# Patient Record
Sex: Male | Born: 1960 | Race: White | Hispanic: No | Marital: Married | Smoking: Never smoker
Health system: Southern US, Community
[De-identification: ages and names within clinical notes are randomized; demographics above are authoritative.]

## PROBLEM LIST (undated history)

## (undated) DIAGNOSIS — J449 Chronic obstructive pulmonary disease, unspecified: Secondary | ICD-10-CM

## (undated) DIAGNOSIS — H1010 Acute atopic conjunctivitis, unspecified eye: Secondary | ICD-10-CM

## (undated) DIAGNOSIS — J4489 Other specified chronic obstructive pulmonary disease: Secondary | ICD-10-CM

## (undated) DIAGNOSIS — G4700 Insomnia, unspecified: Secondary | ICD-10-CM

## (undated) DIAGNOSIS — K589 Irritable bowel syndrome without diarrhea: Secondary | ICD-10-CM

## (undated) DIAGNOSIS — Z8719 Personal history of other diseases of the digestive system: Secondary | ICD-10-CM

## (undated) DIAGNOSIS — E785 Hyperlipidemia, unspecified: Secondary | ICD-10-CM

## (undated) HISTORY — DX: Irritable bowel syndrome, unspecified: K58.9

## (undated) HISTORY — PX: COLONOSCOPY: SHX174

## (undated) HISTORY — DX: Hyperlipidemia, unspecified: E78.5

## (undated) HISTORY — DX: Insomnia, unspecified: G47.00

## (undated) HISTORY — DX: Other specified chronic obstructive pulmonary disease: J44.89

## (undated) HISTORY — DX: Acute atopic conjunctivitis, unspecified eye: H10.10

## (undated) HISTORY — DX: Chronic obstructive pulmonary disease, unspecified: J44.9

## (undated) HISTORY — DX: Personal history of other diseases of the digestive system: Z87.19

## (undated) HISTORY — PX: OTHER SURGICAL HISTORY: SHX169

---

## 1999-09-08 ENCOUNTER — Encounter: Admission: RE | Admit: 1999-09-08 | Discharge: 1999-09-08 | Payer: Self-pay

## 2001-04-19 ENCOUNTER — Encounter: Payer: Self-pay | Admitting: Family Medicine

## 2001-04-19 ENCOUNTER — Encounter: Admission: RE | Admit: 2001-04-19 | Discharge: 2001-04-19 | Payer: Self-pay | Admitting: Family Medicine

## 2003-07-07 DIAGNOSIS — Z860101 Personal history of adenomatous and serrated colon polyps: Secondary | ICD-10-CM

## 2003-07-07 DIAGNOSIS — Z86010 Personal history of colonic polyps: Secondary | ICD-10-CM

## 2003-07-07 HISTORY — DX: Personal history of colonic polyps: Z86.010

## 2003-07-07 HISTORY — DX: Personal history of adenomatous and serrated colon polyps: Z86.0101

## 2004-07-09 ENCOUNTER — Ambulatory Visit: Payer: Self-pay | Admitting: Internal Medicine

## 2005-05-15 ENCOUNTER — Ambulatory Visit: Payer: Self-pay | Admitting: Internal Medicine

## 2006-03-11 ENCOUNTER — Ambulatory Visit: Payer: Self-pay | Admitting: Internal Medicine

## 2007-05-30 ENCOUNTER — Telehealth: Payer: Self-pay | Admitting: Internal Medicine

## 2007-06-06 ENCOUNTER — Ambulatory Visit: Payer: Self-pay | Admitting: Internal Medicine

## 2007-06-12 ENCOUNTER — Encounter: Payer: Self-pay | Admitting: Internal Medicine

## 2007-06-12 DIAGNOSIS — G4700 Insomnia, unspecified: Secondary | ICD-10-CM | POA: Insufficient documentation

## 2007-06-12 DIAGNOSIS — J309 Allergic rhinitis, unspecified: Secondary | ICD-10-CM

## 2007-06-12 DIAGNOSIS — J4489 Other specified chronic obstructive pulmonary disease: Secondary | ICD-10-CM | POA: Insufficient documentation

## 2007-06-12 DIAGNOSIS — H1010 Acute atopic conjunctivitis, unspecified eye: Secondary | ICD-10-CM | POA: Insufficient documentation

## 2007-06-12 DIAGNOSIS — J449 Chronic obstructive pulmonary disease, unspecified: Secondary | ICD-10-CM | POA: Insufficient documentation

## 2007-06-12 DIAGNOSIS — Z8719 Personal history of other diseases of the digestive system: Secondary | ICD-10-CM | POA: Insufficient documentation

## 2007-06-13 ENCOUNTER — Telehealth (INDEPENDENT_AMBULATORY_CARE_PROVIDER_SITE_OTHER): Payer: Self-pay | Admitting: *Deleted

## 2007-06-27 ENCOUNTER — Telehealth (INDEPENDENT_AMBULATORY_CARE_PROVIDER_SITE_OTHER): Payer: Self-pay | Admitting: *Deleted

## 2008-06-12 ENCOUNTER — Ambulatory Visit: Payer: Self-pay | Admitting: Internal Medicine

## 2008-08-13 ENCOUNTER — Telehealth (INDEPENDENT_AMBULATORY_CARE_PROVIDER_SITE_OTHER): Payer: Self-pay | Admitting: *Deleted

## 2009-02-12 ENCOUNTER — Encounter (INDEPENDENT_AMBULATORY_CARE_PROVIDER_SITE_OTHER): Payer: Self-pay | Admitting: *Deleted

## 2009-07-17 ENCOUNTER — Telehealth: Payer: Self-pay | Admitting: Internal Medicine

## 2009-08-07 ENCOUNTER — Ambulatory Visit: Payer: Self-pay | Admitting: Internal Medicine

## 2009-11-18 ENCOUNTER — Encounter (INDEPENDENT_AMBULATORY_CARE_PROVIDER_SITE_OTHER): Payer: Self-pay | Admitting: *Deleted

## 2010-08-05 NOTE — Progress Notes (Signed)
Summary: RX SCRIPTS  Phone Note Call from Patient   Caller: Patient Call For: YOUNG Summary of Call: PT WANTS SCRIPTS SENT TO HIS ADDRESS FOR THE FOLLOWING PRESCRIPTIONS: ALBUTEROL / ADVAIR / AND SINGULAIR. HE WANTS A 3 MONTH SUPPLY. PT# [id number].  [facility] [phone] PATIENT'S CHART HAS BEEN REQUESTED Initial call taken by: Tivis Ringer,  May 30, 2007 11:52 AM  Follow-up for Phone Call        Pt last seen 9/07. Pt overdue for f/u with CY.  Pt made attp for 06/06/07.  Rx written and forwarded to CY to sign. Triage call. Follow-up by: Cloyde Reams,  May 30, 2007 3:08 PM  Additional Follow-up for Phone Call Additional follow up Details #1::        Requested rxs signed and mailed Additional Follow-up by: Waymon Budge MD,  May 30, 2007 4:48 PM

## 2010-08-05 NOTE — Letter (Signed)
Summary: Recall Colonoscopy Letter  [facility]  [address] [place], [place] [address]   Phone: [phone]  Fax: [phone]      February 12, 2009 MRN: [id number]   Justin Pennington [address] [place], [place]  [address]   Dear Mr. Deboard,   According to your medical record, it is time for you to schedule a Colonoscopy. The American Cancer Society recommends this procedure as a method to detect early colon cancer. Patients with a family history of colon cancer, or a personal history of colon polyps or inflammatory bowel disease are at increased risk.  This letter has beeen generated based on the recommendations made at the time of your procedure. If you feel that in your particular situation this may no longer apply, please contact our office.  Please call our office at [phone] to schedule this appointment or to update your records at your earliest convenience.  Thank you for cooperating with [place] to provide you with the very best care possible.   Sincerely,  Iva Boop, M.D.  [facility] [phone]

## 2010-08-05 NOTE — Progress Notes (Signed)
Summary: prescript  Phone Note Call from Patient Call back at [phone]   Caller: Patient Call For: young Summary of Call: need prescript [facility] for advair singulair and albuterol inhaler Initial call taken by: Rickard Patience,  August 13, 2008 1:30 PM  Follow-up for Phone Call        called and spoke with pt. pt aware rx sent to pharmacy.  Megan Reynolds LPN  August 13, 2008 2:51 PM     New/Updated Medications: SINGULAIR 10 MG  TABS (MONTELUKAST SODIUM) Take 1 tablet by mouth once a day ADVAIR DISKUS 100-50 MCG/DOSE  MISC (FLUTICASONE-SALMETEROL) Inhale 1 puff two times a day.  Rinse mouth out after use   Prescriptions: PROAIR HFA 108 (90 BASE) MCG/ACT AERS (ALBUTEROL SULFATE) 2 puffs four times a day as needed  #3 x 3   Entered by:   Arman Filter LPN   Authorized by:   Waymon Budge MD   Signed by:   Arman Filter LPN on 98/05/9146   Method used:   Electronically to        [facility]mail[facility])       [address] [place], [place]  [address]       Ph: [phone]       Fax: [phone]   RxID:   [id number] ADVAIR DISKUS 100-50 MCG/DOSE  MISC (FLUTICASONE-SALMETEROL) Inhale 1 puff two times a day.  Rinse mouth out after use  #3 x 3   Entered by:   Arman Filter LPN   Authorized by:   Waymon Budge MD   Signed by:   Arman Filter LPN on 03/47/4259   Method used:   Electronically to        [facility] [employer]       [address] [place], [place]  [address]       Ph: [phone]       Fax: [phone]   RxID:   [id number] SINGULAIR 10 MG  TABS (MONTELUKAST SODIUM) Take 1 tablet by mouth once a day  #90 x 3   Entered by:   Arman Filter LPN   Authorized by:   Waymon Budge MD   Signed by:   Arman Filter LPN on 54/27/0623   Method used:   Electronically to        [facility] [employer]       [address] [place], [place]  [address]       Ph: [phone]       Fax: [phone]   RxID:   [id number]

## 2010-08-05 NOTE — Progress Notes (Signed)
Summary: rx that needs to be faxed  Phone Note Call from Patient Call back at Home Phone [phone]   Caller: Patient Call For: young Reason for Call: Talk to Nurse, Talk to Doctor Summary of Call: [facility].  They will not accept rx faxed from pt.  Can we please fax rx for albuterol inhaler, advair 100/50, and singulair 10mg  to [facility] fax# [phone].  call pt and let him know when it's taken care of. chart ordered  Initial call taken by: Eugene Gavia,  June 13, 2007 9:25 AM  Follow-up for Phone Call        dr young please sign rx so we can fax thank you. triage call. Follow-up by: Vernie Murders,  June 13, 2007 3:42 PM  Additional Follow-up for Phone Call Additional follow up Details #1::        Done, thanks. Three month rxs. Additional Follow-up by: Waymon Budge MD,  June 13, 2007 8:29 PM    Additional Follow-up for Phone Call Additional follow up Details #2::    Rx faxed. Pt aware. Follow-up by: Cloyde Reams RN,  June 14, 2007 9:36 AM

## 2010-08-05 NOTE — Progress Notes (Signed)
Summary: MAIL ORDER RX  Phone Note Call from Patient Call back at Home Phone [phone]   Caller: Patient Reason for Call: Refill Medication Summary of Call: PT CALLED SAYING HIS MAIL ORDER RX HAS NOT BEEN FAXED OR MAILED PT HAS BEEN WAITING FOR 3 WEEKS/PLEASE F/U  PATIENT'S CHART HAS BEEN REQUESTED Initial call taken by: Lehman Prom,  June 27, 2007 10:09 AM  Follow-up for Phone Call        [facility].  Called in advair 100/50 #3 with 3ref, albuterol HFA #3 with 3 ref, singulair 10mg  #90 with 3 ref. Called pt notified rx called to pharmacy. Follow-up by: Cloyde Reams RN,  June 27, 2007 10:33 AM

## 2010-08-05 NOTE — Assessment & Plan Note (Signed)
Summary: rov/apc   PCP:  Vernon Prey  Chief Complaint:  follow up visit .  History of Present Illness: Current Problems:  CHRONIC OBSTRUCTIVE ASTHMA UNSPECIFIED (ICD-493.20) ALLERGIC CONJUNCTIVITIS (ICD-372.14) INSOMNIA UNSPECIFIED (ICD-780.52) IRRITABLE BOWEL SYNDROME, HX OF (ICD-V12.79)    12/08- HISTORY:  He says that he has done very well on one year follow up. No specific respiratory problems at all. Recent physical examination with consideration of irritable bowel syndrome. He does notice some nasal congestion occasionally, but feels it is adequately managed.   06/12/08- Asthma/ COPD, allergic conjunctivitis Very good year with no acute problems. Had seasonal flu vax. Discussed H1N1. Med talk.          Prior Medications Reviewed Using: Patient Recall  Updated Prior Medication List: SINGULAIR 10 MG  TABS (MONTELUKAST SODIUM) 1 daily ADVAIR DISKUS 100-50 MCG/DOSE  MISC (FLUTICASONE-SALMETEROL) 1 puff twice daily, rinse NASONEX 50 MCG/ACT  SUSP (MOMETASONE FUROATE) 1 spray each nostril daily ALBUTEROL 90 MCG/ACT  AERS (ALBUTEROL) 2 puffs, 4 times dailyas needed  Current Allergies (reviewed today): No known allergies   Past Medical History:    Reviewed history and no changes required:              CHRONIC OBSTRUCTIVE ASTHMA UNSPECIFIED (ICD-493.20)       ALLERGIC CONJUNCTIVITIS (ICD-372.14)       INSOMNIA UNSPECIFIED (ICD-780.52)       IRRITABLE BOWEL SYNDROME, HX OF (ICD-V12.79)         Past Surgical History:    Reviewed history and no changes required:       gingival graft       left meniscus tear- arthroscopic repair   Family History:    brother- mild adult onset asthma  Social History:    Reviewed history and no changes required:       Patient never smoked.        works for [employer]   Risk Factors:  Tobacco use:  never   Review of Systems       Denies headache, sinus drainage, sneezing chest pain, dyspnea, n/v/d, weight  loss, fever, edema.     Vital Signs:  Patient Profile:   50 Years Old Male Weight:      178.50 pounds O2 Sat:      97 % O2 treatment:    Room Air Pulse rate:   65 / minute BP sitting:   122 / 80  (left arm) Cuff size:   regular  Vitals Entered By: Reynaldo Minium CMA (June 12, 2008 10:12 AM)             Comments Medications reviewed with patient Reynaldo Minium CMA  June 12, 2008 10:13 AM      Physical Exam  General: A/Ox3; pleasant and cooperative, NAD, SKIN: no rash, lesions NODES: no lymphadenopathy HEENT: [place]/AT, EOM- WNL, Conjuctivae- clear, PERRLA, TM-WNL, Nose- clear, Throat- clear and wnl NECK: Supple w/ fair ROM, JVD- none, normal carotid impulses w/o bruits Thyroid- normal to palpation CHEST: Trace wheeze Right axilla, slow expiratory phase, no cough or rhonchi, unlabored. HEART: RRR, no m/g/r heard ABDOMEN: Soft and nl; nml bowel sounds; no organomegaly or masses noted ZOX:WRUE, nl pulses, no edema  NEURO: Grossly intact to observation         Problem # 1:  CHRONIC OBSTRUCTIVE ASTHMA UNSPECIFIED (ICD-493.20) We think this is chronic fixed asthma. He has done quite well inlast couple of years, having learned to manage exposures and work with meds. Plan H1N1  vax. His updated medication list for this problem includes:    Singulair 10 Mg Tabs (Montelukast sodium) .Marland Kitchen... 1 daily    Advair Diskus 100-50 Mcg/dose Misc (Fluticasone-salmeterol) .Marland Kitchen... 1 puff twice daily, rinse    Proair Hfa 108 (90 Base) Mcg/act Aers (Albuterol sulfate) .Marland Kitchen... 2 puffs four times a day as needed   Problem # 2:  ALLERGIC CONJUNCTIVITIS (ICD-372.14) Assessment: Improved  Medications Added to Medication List This Visit: 1)  Proair Hfa 108 (90 Base) Mcg/act Aers (Albuterol sulfate) .... 2 puffs four times a day as needed   Flu Vaccine Consent Questions     Do you have a history of severe allergic reactions to this vaccine? no    Any prior history of allergic reactions to egg  and/or gelatin? no    Do you have a sensitivity to the preservative Thimersol? no    Do you have a past history of Guillan-Barre Syndrome? no    Do you currently have an acute febrile illness? no    Have you ever had a severe reaction to latex? no    Vaccine information given and explained to patient? yes    Are you currently pregnant? no   Do you have Asthma? no   Lot Number: [id number]   Exp Date:01/02/2009   Site Given  Deltoid Right Marijo File CMA  June 12, 2008 10:52 AM      Patient Instructions: 1)  Please schedule a follow-up appointment in 1 year. 2)  Call as needed and to refill meds. 3)  H1N1 flu vaccine.   Prescriptions: PROAIR HFA 108 (90 BASE) MCG/ACT AERS (ALBUTEROL SULFATE) 2 puffs four times a day as needed  #3 x 3   Entered and Authorized by:   Waymon Budge MD   Signed by:   Waymon Budge MD on 06/12/2008   Method used:   Historical   RxID:   [id number]  ]

## 2010-08-05 NOTE — Progress Notes (Signed)
Summary: Schedule Colonoscopy  Phone Note Outgoing Call Call back at Home Phone [phone]   Call placed by: Harlow Mares CMA Duncan Dull),  July 17, 2009 2:04 PM Call placed to: Patient Summary of Call: patient has had a recent death in his family and would like me to call him back in a month. I advised him I will call him in about a month to set up his colonoscopy. Initial call taken by: Harlow Mares CMA Duncan Dull),  July 17, 2009 2:04 PM     Appended Document: Schedule Colonoscopy called patient again to try to schedule and he now wants to schedule in he fall we will mail him a reminder letter. now

## 2010-08-05 NOTE — Assessment & Plan Note (Signed)
Summary: 12 months/apc   Primary Provider/Referring Provider:  Vernon Prey  CC:  Yearly follow up visit-asthma and allergies; occasional sinus drainage but not bothersome.Marland Kitchen  History of Present Illness:  12/08- HISTORY:  He says that he has done very well on one year follow up. No specific respiratory problems at all. Recent physical examination with consideration of irritable bowel syndrome. He does notice some nasal congestion occasionally, but feels it is adequately managed.   06/12/08- Asthma/ COPD, allergic conjunctivitis Very good year with no acute problems. Had seasonal flu vax. Discussed H1N1. Med talk.  August 07, 2009- Asthma/COPD, allergic rhinitis Continues to use Advair and Proair just once daily. Rarely needs nasonex. Eyes bother him most- treated otc drops  Consider updating PFT and CXR next year  Current Medications (verified): 1)  Singulair 10 Mg  Tabs (Montelukast Sodium) .... Take 1 Tablet By Mouth Once A Day 2)  Advair Diskus 100-50 Mcg/dose  Misc (Fluticasone-Salmeterol) .... Inhale 1 Puff Two Times A Day.  Rinse Mouth Out After Use 3)  Nasonex 50 Mcg/act  Susp (Mometasone Furoate) .Marland Kitchen.. 1 Spray Each Nostril Daily 4)  Proair Hfa 108 (90 Base) Mcg/act Aers (Albuterol Sulfate) .... 2 Puffs Four Times A Day As Needed  Allergies (verified): No Known Drug Allergies  Past History:  Past Medical History: Last updated: 06/12/2008  CHRONIC OBSTRUCTIVE ASTHMA UNSPECIFIED (ICD-493.20) ALLERGIC CONJUNCTIVITIS (ICD-372.14) INSOMNIA UNSPECIFIED (ICD-780.52) IRRITABLE BOWEL SYNDROME, HX OF (ICD-V12.79)  Past Surgical History: Last updated: 06/12/2008 gingival graft left meniscus tear- arthroscopic repair  Family History: Last updated: 08/07/2009 brother- mild adult onset asthma Father- died postop complications fo bladder cancer  Social History: Last updated: 06/12/2008 Patient never smoked.  works for [employer]  Risk Factors: Smoking  Status: never (06/12/2008)  Family History: brother- mild adult onset asthma Father- died postop complications fo bladder cancer  Review of Systems      See HPI  The patient denies anorexia, fever, weight loss, weight gain, vision loss, decreased hearing, hoarseness, chest pain, syncope, dyspnea on exertion, peripheral edema, prolonged cough, headaches, hemoptysis, and severe indigestion/heartburn.    Vital Signs:  Patient profile:   50 year old male Height:      70 inches Weight:      177.38 pounds BMI:     25.54 O2 Sat:      93 % on Room air Pulse rate:   71 / minute BP sitting:   118 / 62  (left arm) Cuff size:   regular  Vitals Entered By: Reynaldo Minium CMA (August 07, 2009 9:58 AM)  O2 Flow:  Room air  Physical Exam  Additional Exam:  General: A/Ox3; pleasant and cooperative, NAD, SKIN: no rash, lesions NODES: no lymphadenopathy HEENT: [place]/AT, EOM- WNL, Conjuctivae- clear, PERRLA, TM-WNL, Nose- clear, Throat- clear and wnl NECK: Supple w/ fair ROM, JVD- none, normal carotid impulses w/o bruits Thyroid- CHEST: Trace wheeze Right axilla, slow expiratory phase, no cough or rhonchi, unlabored. HEART: RRR, no m/g/r heard ABDOMEN: Soft and nl; ZOX:WRUE, nl pulses, no edema  NEURO: Grossly intact to observation      Impression & Recommendations:  Problem # 1:  CHRONIC OBSTRUCTIVE ASTHMA UNSPECIFIED (ICD-493.20) Doing very well without changes needed. Had flu vax. has hasd at least one, probably 2 Pneumovax.  consider updating PFT and CXR next year.  Problem # 2:  ALLERGIC CONJUNCTIVITIS (ICD-372.14)  Eyes look fine on exanm now. he is aware to see his opthalm and also that tere are prescription eye  meds if needed.  Other Orders: Est. Patient Level III ([address])  Patient Instructions: 1)  Schedule return in one year, earlier if needed 2)  Call for scripts as needed. Prescriptions: PROAIR HFA 108 (90 BASE) MCG/ACT AERS (ALBUTEROL SULFATE) 2 puffs four times a  day as needed  #3 x 3   Entered and Authorized by:   Waymon Budge MD   Signed by:   Waymon Budge MD on 08/07/2009   Method used:   Print then Give to Patient   RxID:   [id number] ADVAIR DISKUS 100-50 MCG/DOSE  MISC (FLUTICASONE-SALMETEROL) Inhale 1 puff two times a day.  Rinse mouth out after use  #3 x 3   Entered and Authorized by:   Waymon Budge MD   Signed by:   Waymon Budge MD on 08/07/2009   Method used:   Print then Give to Patient   RxID:   [id number] SINGULAIR 10 MG  TABS (MONTELUKAST SODIUM) Take 1 tablet by mouth once a day  #90 x 3   Entered and Authorized by:   Waymon Budge MD   Signed by:   Waymon Budge MD on 08/07/2009   Method used:   Print then Give to Patient   RxID:   [id number]      Appended Document: 12 months/apc    Clinical Lists Changes  Observations: Added new observation of PNEUMOVAX: Historical (06/06/2007 11:33)       Immunization History:  Pneumovax Immunization History:    Pneumovax:  historical (06/06/2007)

## 2010-08-05 NOTE — Letter (Signed)
Summary: Colonoscopy Letter  [facility]  [address] [place], [place] [address]   Phone: [phone]  Fax: [phone]      Nov 18, 2009 MRN: [id number]   Justin Pennington [address] [place], [place]  [address]   Dear [name],   According to your medical record, it is time for you to schedule a Colonoscopy. The American Cancer Society recommends this procedure as a method to detect early colon cancer. Patients with a family history of colon cancer, or a personal history of colon polyps or inflammatory bowel disease are at increased risk.  This letter has beeen generated based on the recommendations made at the time of your procedure. If you feel that in your particular situation this may no longer apply, please contact our office.  Please call our office at [phone] to schedule this appointment or to update your records at your earliest convenience.  Thank you for cooperating with [place] to provide you with the very best care possible.   Sincerely,  Iva Boop, M.D.  [facility] [phone]

## 2010-08-07 ENCOUNTER — Ambulatory Visit: Admit: 2010-08-07 | Payer: Self-pay | Admitting: Internal Medicine

## 2010-08-07 ENCOUNTER — Ambulatory Visit: Payer: Self-pay | Admitting: Internal Medicine

## 2010-08-21 ENCOUNTER — Telehealth: Payer: Self-pay | Admitting: Internal Medicine

## 2010-08-27 NOTE — Progress Notes (Signed)
Summary: refill advair asap  Phone Note Call from Patient Call back at Home Phone [phone]   Caller: Patient Call For: Justin Pennington Summary of Call: pt wants rx called in asap for advair. [facility] [phone]. pt has appt next month w/ dr Windsor Goeken Initial call taken by: Tivis Ringer, CNA,  August 21, 2010 1:19 PM  Follow-up for Phone Call        Advair rx was sent to [facility] on 08/19/10 #3 x 0.  Pt was last seen by CY 08/2009 and has pending OV on 09/05/2010.   Crystal Jones RN  August 21, 2010 2:35 PM   called and spoke with pt and he stated that he spoke with [facility] and they have not received any refill request for the advair---resent rx for pt and he is aware of this.   Randell Loop CMA  August 21, 2010 4:39 PM      Prescriptions: ADVAIR DISKUS 100-50 MCG/DOSE  MISC (FLUTICASONE-SALMETEROL) Inhale 1 puff two times a day.  Rinse mouth out after use  #3 x 0   Entered by:   Randell Loop CMA   Authorized by:   Waymon Budge MD   Signed by:   Randell Loop CMA on 08/21/2010   Method used:   Faxed to ...       [facility]       [address]       [place], [place]  [address]       Ph: [phone]       Fax: [phone]   RxID:   [id number]

## 2010-09-05 ENCOUNTER — Ambulatory Visit (INDEPENDENT_AMBULATORY_CARE_PROVIDER_SITE_OTHER)
Admission: RE | Admit: 2010-09-05 | Discharge: 2010-09-05 | Disposition: A | Discharge status: Home / Self Care | Payer: PRIVATE HEALTH INSURANCE | Source: Ambulatory Visit | Attending: Internal Medicine | Admitting: Internal Medicine

## 2010-09-05 ENCOUNTER — Other Ambulatory Visit: Payer: Self-pay | Admitting: Internal Medicine

## 2010-09-05 ENCOUNTER — Ambulatory Visit (INDEPENDENT_AMBULATORY_CARE_PROVIDER_SITE_OTHER): Payer: PRIVATE HEALTH INSURANCE | Admitting: Internal Medicine

## 2010-09-05 ENCOUNTER — Encounter: Payer: Self-pay | Admitting: Internal Medicine

## 2010-09-05 DIAGNOSIS — J4489 Other specified chronic obstructive pulmonary disease: Secondary | ICD-10-CM

## 2010-09-05 DIAGNOSIS — J302 Other seasonal allergic rhinitis: Secondary | ICD-10-CM | POA: Insufficient documentation

## 2010-09-05 DIAGNOSIS — J449 Chronic obstructive pulmonary disease, unspecified: Secondary | ICD-10-CM

## 2010-09-05 DIAGNOSIS — J3089 Other allergic rhinitis: Secondary | ICD-10-CM

## 2010-09-05 DIAGNOSIS — H1045 Other chronic allergic conjunctivitis: Secondary | ICD-10-CM

## 2010-09-05 DIAGNOSIS — J309 Allergic rhinitis, unspecified: Secondary | ICD-10-CM

## 2010-09-16 NOTE — Assessment & Plan Note (Signed)
Summary: Justin Pennington   Primary Provider/Referring Provider:  Vernon Prey  CC:  Follow up visit-asthma and allergies..  History of Present Illness: 12/08- HISTORY:  He says that he has done very well on one year follow up. No specific respiratory problems at all. Recent physical examination with consideration of irritable bowel syndrome. He does notice some nasal congestion occasionally, but feels it is adequately managed.  06/12/08- Asthma/ COPD, allergic conjunctivitis Very good year with no acute problems. Had seasonal flu vax. Discussed H1N1. Med talk.  August 07, 2009- Asthma/COPD, allergic rhinitis Continues to use Advair and Proair just once daily. Rarely needs nasonex. Eyes bother him most- treated otc drops  Consider updating PFT and CXR next year  September 05, 2010- Asthma/ COPD, allergic conjunctivitis Nurse-CC: Follow up visit-asthma and allergies. Notes some random eye watering- discussed possible triggers.  Exercises three times weekly and gets expected dyspnea with that. Denies cough, wheeze, chest pain.  meds reviewed as listed. Youngest child has allergies, 51 yo brother has adult onset "allergic asthma".    Asthma History    Initial Asthma Severity Rating:    Age range: 12+ years    Symptoms: 0-2 days/week    Nighttime Awakenings: 0-2/month    Interferes w/ normal activity: no limitations    SABA use (not for EIB): 0-2 days/week    Asthma Severity Assessment: Intermittent   Preventive Screening-Counseling & Management  Alcohol-Tobacco     Smoking Status: never  Current Medications (verified): 1)  Singulair 10 Mg  Tabs (Montelukast Sodium) .... Take 1 Tablet By Mouth Once A Day 2)  Advair Diskus 100-50 Mcg/dose  Misc (Fluticasone-Salmeterol) .... Inhale 1 Puff Two Times A Day.  Rinse Mouth Out After Use 3)  Nasonex 50 Mcg/act  Susp (Mometasone Furoate) .Marland Kitchen.. 1 Spray Each Nostril Daily As Needed 4)  Proair Hfa 108 (90 Base) Mcg/act Aers (Albuterol Sulfate) .... 2  Puffs Four Times A Day As Needed  Allergies (verified): No Known Drug Allergies  Past History:  Past Medical History: Last updated: 06/12/2008  CHRONIC OBSTRUCTIVE ASTHMA UNSPECIFIED (ICD-493.20) ALLERGIC CONJUNCTIVITIS (ICD-372.14) INSOMNIA UNSPECIFIED (ICD-780.52) IRRITABLE BOWEL SYNDROME, HX OF (ICD-V12.79)  Past Surgical History: Last updated: 06/12/2008 gingival graft left meniscus tear- arthroscopic repair  Family History: Last updated: 08/07/2009 brother- mild adult onset asthma Father- died postop complications fo bladder cancer  Social History: Last updated: 06/12/2008 Patient never smoked.  works for [employer]  Risk Factors: Smoking Status: never (09/05/2010)  Review of Systems      See HPI       The patient complains of shortness of breath with activity.  The patient denies shortness of breath at rest, productive cough, non-productive cough, coughing up blood, chest pain, irregular heartbeats, acid heartburn, indigestion, loss of appetite, weight change, abdominal pain, difficulty swallowing, sore throat, tooth/dental problems, headaches, nasal congestion/difficulty breathing through nose, sneezing, itching, ear ache, and anxiety.    Vital Signs:  Patient profile:   50 year old male Height:      70 inches Weight:      178.25 pounds BMI:     25.67 O2 Sat:      95 % on Room air Pulse rate:   76 / minute BP sitting:   124 / 80  (left arm) Cuff size:   regular  Vitals Entered By: Reynaldo Minium CMA (September 05, 2010 10:10 AM)  O2 Flow:  Room air CC: Follow up visit-asthma and allergies.   Physical Exam  Additional Exam:  General: A/Ox3; pleasant and cooperative, NAD, SKIN: no rash, lesions NODES: no lymphadenopathy HEENT: [place]/AT, EOM- WNL, Conjuctivae- clear, PERRLA, TM-WNL, Nose- stuffy with turbinate edema and sniffing, Throat- clear and wnl, Mallampati  III NECK: Supple w/ fair ROM, JVD- none, normal carotid impulses w/o bruits  Thyroid- CHEST: Trace wheeze Right axilla, slow expiratory phase, no cough or rhonchi, unlabored. HEART: RRR, no m/g/r heard ABDOMEN: Soft and nl; RKY:HCWC, nl pulses, no edema  NEURO: Grossly intact to observation      Impression & Recommendations:  Problem # 1:  CHRONIC OBSTRUCTIVE ASTHMA UNSPECIFIED (ICD-493.20) Well controlled, but baseline needs to be re-established. We will update CXR and PFT  Problem # 2:  ALLERGIC CONJUNCTIVITIS (ICD-372.14)  Fair control. Will suggest otc meds.   Problem # 3:  ALLERGIC RHINITIS (ICD-477.9)  Already on Nasonex and singulair. We discussed antihistamines and decongetants.  His updated medication list for this problem includes:    Nasonex 50 Mcg/act Susp (Mometasone furoate) .Marland Kitchen... 1 spray each nostril daily as needed  Orders: Est. Patient Level III (37628)  Medications Added to Medication List This Visit: 1)  Nasonex 50 Mcg/act Susp (Mometasone furoate) .Marland Kitchen.. 1 spray each nostril daily as needed  Other Orders: T-2 View CXR ([id number])  Patient Instructions: 1)  Please schedule a follow-up appointment in 1 year. Please call sooner as needed. 2)  Refill scripts 3)  Antihistamines for sneeze, drainage- 4)  loratadine/ Claritin 5)  cetirizine/ Zyrtec 6)  fexofenadine/ Allegra (180) 7)  otc decongestant- phenylephrine    "-PE", like Sudafed-PE and others 8)  A chest x-ray has been recommended.  Your imaging study may require preauthorization.  9)  Schedule PFT 10)  Test for a1AT Prescriptions: PROAIR HFA 108 (90 BASE) MCG/ACT AERS (ALBUTEROL SULFATE) 2 puffs four times a day as needed  #3 x 3   Entered and Authorized by:   Waymon Budge MD   Signed by:   Waymon Budge MD on 09/05/2010   Method used:   Print then Give to Patient   RxID:   [id number] NASONEX 50 MCG/ACT  SUSP (MOMETASONE FUROATE) 1 spray each nostril daily as needed  #3 x 3   Entered and Authorized by:   Waymon Budge MD   Signed by:   Waymon Budge MD on  09/05/2010   Method used:   Print then Give to Patient   RxID:   [id number] ADVAIR DISKUS 100-50 MCG/DOSE  MISC (FLUTICASONE-SALMETEROL) Inhale 1 puff two times a day.  Rinse mouth out after use  #3 x 3   Entered and Authorized by:   Waymon Budge MD   Signed by:   Waymon Budge MD on 09/05/2010   Method used:   Print then Give to Patient   RxID:   [id number] SINGULAIR 10 MG  TABS (MONTELUKAST SODIUM) Take 1 tablet by mouth once a day  #90 x 3   Entered and Authorized by:   Waymon Budge MD   Signed by:   Waymon Budge MD on 09/05/2010   Method used:   Print then Give to Patient   RxID:   [id number]

## 2010-09-18 ENCOUNTER — Encounter: Payer: Self-pay | Admitting: Internal Medicine

## 2010-09-18 LAB — CONVERTED CEMR LAB

## 2010-09-23 NOTE — Miscellaneous (Signed)
Summary: ALPHA 1 ANTITRYPSIN RESULTS/KCW  Clinical Lists Changes  Observations: Added new observation of A-1 ANTITRYP: MM (09/18/2010 11:11)

## 2010-09-30 ENCOUNTER — Encounter: Payer: Self-pay | Admitting: Internal Medicine

## 2010-10-13 NOTE — Progress Notes (Signed)
Quick Note:  Pt aware of results. ______ 

## 2010-10-13 NOTE — Progress Notes (Signed)
Quick Note:  LMTCB ______ 

## 2010-11-17 ENCOUNTER — Telehealth: Payer: Self-pay | Admitting: Internal Medicine

## 2010-11-17 MED ORDER — MONTELUKAST SODIUM 10 MG PO TABS: 10.0000 mg | ORAL_TABLET | Freq: Every day | ORAL | Status: DC

## 2010-11-17 MED ORDER — ALBUTEROL SULFATE HFA 108 (90 BASE) MCG/ACT IN AERS: 2.0000 | INHALATION_SPRAY | Freq: Four times a day (QID) | RESPIRATORY_TRACT | Status: DC | PRN

## 2010-11-17 NOTE — Telephone Encounter (Signed)
Spoke with pt to verify msg. He is requesting 30 day supply on singulair and proair. Rxs were sent to pharm.

## 2010-11-17 NOTE — Telephone Encounter (Signed)
LMOMTCB

## 2010-11-18 NOTE — Assessment & Plan Note (Signed)
[facility]                             PULMONARY OFFICE NOTE   NAME:Justin Pennington, Justin Pennington                        MRN:          [id number]  DATE:06/06/2007                            DOB:          1960-08-17    PROBLEM:  1. Asthma/chronic obstructive pulmonary disease.  2. Allergic conjunctivitis.  3. History of insomnia.   HISTORY:  He says that he has done very well on one year follow up. No  specific respiratory problems at all. Recent physical examination with  consideration of irritable bowel syndrome. He does notice some nasal  congestion occasionally, but feels it is adequately managed.   MEDICATIONS:  1. Singulair 10 mg.  2. Advair discus 100/50.  3. Crestor 5 mg.  4. Colestid.  5. Albuterol inhaler p.r.n.  6. Nasonex p.r.n. He needs Nasonex refilled.   ALLERGIES:  No medication allergy.   OBJECTIVE:  Weight 177 pounds, blood pressure 124/82, pulse 82, room air  saturation 98%. There is mild nasal congestion and septal deviation. No  post-nasal drainage. Conjunctivae are not injected. No adenopathy. Lungs  are clear. Heart sounds are regular without murmur. He has had flu  vaccine and had pneumococcal vaccine last year.   IMPRESSION:  Allergic rhinitis. Asthma with a fixed component.  Previously demonstrated atopic component. The last PFT I can find was  from 2003 when his FEV1 was 64% of predicted. I will mark chart that  needs to be done on return.   PLAN:  Schedule return 1 year, earlier p.r.n.     Clinton D. Maple Hudson, MD, [facility], FACP  Electronically Signed    CDY/MedQ  DD: 06/12/2007  DT: 06/13/2007  Job #: [id number]

## 2010-11-19 ENCOUNTER — Other Ambulatory Visit: Payer: Self-pay | Admitting: *Deleted

## 2010-11-19 MED ORDER — ALBUTEROL SULFATE HFA 108 (90 BASE) MCG/ACT IN AERS: 2.0000 | INHALATION_SPRAY | Freq: Four times a day (QID) | RESPIRATORY_TRACT | Status: DC | PRN

## 2010-11-19 MED ORDER — MONTELUKAST SODIUM 10 MG PO TABS: 10.0000 mg | ORAL_TABLET | Freq: Every day | ORAL | Status: DC

## 2010-11-21 NOTE — Assessment & Plan Note (Signed)
[facility]                               PULMONARY OFFICE NOTE   NAME:Strege, Justin Pennington                        MRN:          [id number]  DATE:03/11/2006                            DOB:          [birth year]    PROBLEM:  1. Asthma/chronic obstructive pulmonary disease.  2. Allergic conjunctivitis.  3. History of insomnia.   HISTORY:  One-year followup.  Reports a good year and good summer.  Just in  the last few days, he and his wife and child have all developed nasal  congestion without sore throat.  Eyes are okay.  Chest feels fine.  He is  not sure yet if this allergy or a cold.   MEDICATIONS:  1. Singulair 10 mg.  2. Advair Diskus 100/50.  3. Albuterol rescue inhaler.  4. They have an old Nasonex they were sharing in the family and discussed      this.   NO MEDICATION ALLERGY.   OBJECTIVE:  Weight 173 pounds, BP 130/88, pulse rate of 65, room air  saturation 97%.  Marked nasal turbinate edema, clear watery secretion.  Minimal periorbital puffiness.  No conjunctival injection.  Pharynx is  clear.  LUNGS:  Clear, somewhat distant but unlabored.  HEART:  Sounds regular without murmur or gallop.  P2 is not increased.  There is no cyanosis or clubbing.   IMPRESSION:  Asthma/chronic obstructive pulmonary disease, stable and  probably reflecting fixed chronic asthma in this never-smoker.  Rhinitis now  is allergic or possibly viral.   PLAN:  1. Try Sudafed PE.  2. Prescription Nasonex twice each nostril.  3. Continue present medications.  Scheduled return 1 year, earlier p.r.n.                                   Clinton D. Maple Hudson, MD, [facility], FACP   CDY/MedQ  DD:  03/11/2006  DT:  03/11/2006  Job #:  [id number]   cc:   Ernestina Penna, M.D.

## 2011-08-18 ENCOUNTER — Other Ambulatory Visit: Payer: Self-pay | Admitting: Internal Medicine

## 2011-08-18 ENCOUNTER — Telehealth: Payer: Self-pay | Admitting: Internal Medicine

## 2011-08-18 MED ORDER — FLUTICASONE-SALMETEROL 100-50 MCG/DOSE IN AEPB: 1.0000 | INHALATION_SPRAY | Freq: Two times a day (BID) | RESPIRATORY_TRACT | Status: DC

## 2011-08-18 NOTE — Telephone Encounter (Signed)
I spoke with pt and he stated he needed a 90 day supply of advair. I advised will send rx and nothing further was needed

## 2011-09-04 ENCOUNTER — Encounter: Payer: Self-pay | Admitting: Internal Medicine

## 2011-09-07 ENCOUNTER — Ambulatory Visit (INDEPENDENT_AMBULATORY_CARE_PROVIDER_SITE_OTHER): Payer: PRIVATE HEALTH INSURANCE | Admitting: Internal Medicine

## 2011-09-07 ENCOUNTER — Encounter: Payer: Self-pay | Admitting: Internal Medicine

## 2011-09-07 VITALS — BP 132/80 | HR 77 | Ht 70.0 in | Wt 180.4 lb

## 2011-09-07 DIAGNOSIS — J309 Allergic rhinitis, unspecified: Secondary | ICD-10-CM

## 2011-09-07 DIAGNOSIS — J4489 Other specified chronic obstructive pulmonary disease: Secondary | ICD-10-CM

## 2011-09-07 DIAGNOSIS — H1045 Other chronic allergic conjunctivitis: Secondary | ICD-10-CM

## 2011-09-07 DIAGNOSIS — J449 Chronic obstructive pulmonary disease, unspecified: Secondary | ICD-10-CM

## 2011-09-07 MED ORDER — ALBUTEROL SULFATE HFA 108 (90 BASE) MCG/ACT IN AERS: 2.0000 | INHALATION_SPRAY | Freq: Four times a day (QID) | RESPIRATORY_TRACT | Status: DC | PRN

## 2011-09-07 MED ORDER — MONTELUKAST SODIUM 10 MG PO TABS: 10.0000 mg | ORAL_TABLET | Freq: Every day | ORAL | Status: DC

## 2011-09-07 MED ORDER — FLUTICASONE-SALMETEROL 100-50 MCG/DOSE IN AEPB: 1.0000 | INHALATION_SPRAY | Freq: Two times a day (BID) | RESPIRATORY_TRACT | Status: DC

## 2011-09-07 NOTE — Patient Instructions (Signed)
Meds refilled   Antihistamine of choice otc as needed with the spring pollens   Consider a dry -eye ointment like Lacrilube otc when simple lubricating artificial tears are not enough

## 2011-09-07 NOTE — Progress Notes (Signed)
09/07/11- 51 yo M never smoker followed for  LOV- 09/05/2010 Another good year with few respiratory problems. Eyes watered this winter, probably from overdrying. He sees no reason to change what he is doing. Continues Advair once daily. Exercises regularly and not aware of shortness of breath. No need for rescue inhaler. No PFT found in electronic file.  ROS-see HPI Constitutional:   No-   weight loss, night sweats, fevers, chills, fatigue, lassitude. HEENT:   No-  headaches, difficulty swallowing, tooth/dental problems, sore throat,       No-  sneezing, itching, ear ache, nasal congestion, post nasal drip,  CV:  No-   chest pain, orthopnea, PND, swelling in lower extremities, anasarca,  dizziness, palpitations Resp: No-   shortness of breath with exertion or at rest.              No-   productive cough,  No non-productive cough,  No- coughing up of blood.              No-   change in color of mucus.  No- wheezing.   Skin: No-   rash or lesions. GI:  No-   heartburn, indigestion, abdominal pain, nausea, GU: . MS:  No-   joint pain or swelling.  No- decreased range of motion.  No- back pain. Neuro-     nothing unusual Psych:  No- change in mood or affect. No depression or anxiety.  No memory loss.  OBJ- Physical Exam General- Alert, Oriented, Affect-appropriate, Distress- none acute, fit-appearing Skin- rash-none, lesions- none, excoriation- none Lymphadenopathy- none Head- atraumatic            Eyes- Gross vision intact, PERRLA, conjunctivae and secretions clear            Ears- Hearing, canals-normal            Nose- Clear, no-Septal dev, mucus, polyps, erosion, perforation. Repeated sniffing.            Throat- Mallampati II , mucosa clear , drainage- none, tonsils- atrophic Neck- flexible , trachea midline, no stridor , thyroid nl, carotid no bruit Chest - symmetrical excursion , unlabored           Heart/CV- RRR , no murmur , no gallop  , no rub, nl s1 s2                           -  JVD- none , edema- none, stasis changes- none, varices- none           Lung- clear to P&A, wheeze- none, cough- none , dullness-none, rub- none           Chest wall-  Abd-  Br/ Gen/ Rectal- Not done, not indicated Extrem- cyanosis- none, clubbing, none, atrophy- none, strength- nl Neuro- grossly intact to observation

## 2011-09-11 NOTE — Assessment & Plan Note (Signed)
Very stable with minimal medication need. We are looking for old PFT

## 2011-09-11 NOTE — Assessment & Plan Note (Signed)
Allergy versus overdrying. Try Lacri-Lube

## 2011-09-11 NOTE — Assessment & Plan Note (Signed)
Persistent sniffing. Discussed antihistamines.

## 2012-02-09 ENCOUNTER — Other Ambulatory Visit: Payer: Self-pay | Admitting: Internal Medicine

## 2012-02-23 ENCOUNTER — Telehealth: Payer: Self-pay | Admitting: Internal Medicine

## 2012-02-23 MED ORDER — MONTELUKAST SODIUM 10 MG PO TABS
10.0000 mg | ORAL_TABLET | Freq: Every day | ORAL | Status: DC
Start: 2012-02-23 — End: 2012-07-13

## 2012-02-23 MED ORDER — FLUTICASONE-SALMETEROL 100-50 MCG/DOSE IN AEPB: INHALATION_SPRAY | RESPIRATORY_TRACT | Status: DC

## 2012-02-23 NOTE — Telephone Encounter (Signed)
Called, spoke with pt.  He would like to start getting meds through [facility].  Requesting 1 mo rxs for advair 100/50 and [facility] to [facility].  Rxs sent -- pt aware.

## 2012-05-10 ENCOUNTER — Encounter: Payer: Self-pay | Admitting: Internal Medicine

## 2012-06-09 ENCOUNTER — Ambulatory Visit (AMBULATORY_SURGERY_CENTER): Payer: PRIVATE HEALTH INSURANCE | Admitting: *Deleted

## 2012-06-09 VITALS — Ht 69.5 in | Wt 177.6 lb

## 2012-06-09 DIAGNOSIS — Z1211 Encounter for screening for malignant neoplasm of colon: Secondary | ICD-10-CM

## 2012-06-09 MED ORDER — NA SULFATE-K SULFATE-MG SULF 17.5-3.13-1.6 GM/177ML PO SOLN: ORAL | Status: DC

## 2012-06-17 ENCOUNTER — Ambulatory Visit (AMBULATORY_SURGERY_CENTER): Payer: PRIVATE HEALTH INSURANCE | Admitting: Internal Medicine

## 2012-06-17 ENCOUNTER — Encounter: Payer: Self-pay | Admitting: Internal Medicine

## 2012-06-17 VITALS — BP 131/80 | HR 68 | Temp 97.0°F | Resp 26 | Ht 69.5 in | Wt 177.0 lb

## 2012-06-17 DIAGNOSIS — Z1211 Encounter for screening for malignant neoplasm of colon: Secondary | ICD-10-CM

## 2012-06-17 DIAGNOSIS — K5730 Diverticulosis of large intestine without perforation or abscess without bleeding: Secondary | ICD-10-CM

## 2012-06-17 MED ORDER — SODIUM CHLORIDE 0.9 % IV SOLN: 500.0000 mL | INTRAVENOUS | Status: DC

## 2012-06-17 MED ORDER — DICYCLOMINE HCL 20 MG PO TABS: 20.0000 mg | ORAL_TABLET | Freq: Four times a day (QID) | ORAL | Status: DC | PRN

## 2012-06-17 NOTE — Patient Instructions (Addendum)
I have prescribed dicyclomine to try for the Irritable Bowel - it slows things down and you can try it in the mornings.  You can also use loperamide 1-2 (over the counter).  It is also worth taking a probiotic (example is Librarian, academic) for 1 month to see what that does.  There were no polyps in the colon but you do have diverticulosis. Next routine colonoscopy 10 years 2023.  Thank you for choosing me and [facility].  Iva Boop, MD, FACG   YOU HAD AN ENDOSCOPIC PROCEDURE TODAY AT THE [facility]: Refer to the procedure report that was given to you for any specific questions about what was found during the examination.  If the procedure report does not answer your questions, please call your gastroenterologist to clarify.  If you requested that your care partner not be given the details of your procedure findings, then the procedure report has been included in a sealed envelope for you to review at your convenience later.  YOU SHOULD EXPECT: Some feelings of bloating in the abdomen. Passage of more gas than usual.  Walking can help get rid of the air that was put into your GI tract during the procedure and reduce the bloating. If you had a lower endoscopy (such as a colonoscopy or flexible sigmoidoscopy) you may notice spotting of blood in your stool or on the toilet paper. If you underwent a bowel prep for your procedure, then you may not have a normal bowel movement for a few days.  DIET: Your first meal following the procedure should be a light meal and then it is ok to progress to your normal diet.  A half-sandwich or bowl of soup is an example of a good first meal.  Heavy or fried foods are harder to digest and may make you feel nauseous or bloated.  Likewise meals heavy in dairy and vegetables can cause extra gas to form and this can also increase the bloating.  Drink plenty of fluids but you should avoid alcoholic beverages for 24 hours.  ACTIVITY: Your care  partner should take you home directly after the procedure.  You should plan to take it easy, moving slowly for the rest of the day.  You can resume normal activity the day after the procedure however you should NOT DRIVE or use heavy machinery for 24 hours (because of the sedation medicines used during the test).    SYMPTOMS TO REPORT IMMEDIATELY: A gastroenterologist can be reached at any hour.  During normal business hours, 8:30 AM to 5:00 PM Monday through Friday, call [phone].  After hours and on weekends, please call the GI answering service at [phone] who will take a message and have the physician on call contact you.   Following lower endoscopy (colonoscopy or flexible sigmoidoscopy):  Excessive amounts of blood in the stool  Significant tenderness or worsening of abdominal pains  Swelling of the abdomen that is new, acute  Fever of 100F or higher   FOLLOW UP: If any biopsies were taken you will be contacted by phone or by letter within the next 1-3 weeks.  Call your gastroenterologist if you have not heard about the biopsies in 3 weeks.  Our staff will call the home number listed on your records the next business day following your procedure to check on you and address any questions or concerns that you may have at that time regarding the information given to you following your procedure. This is a Research officer, political party  call and so if there is no answer at the home number and we have not heard from you through the emergency physician on call, we will assume that you have returned to your regular daily activities without incident.  SIGNATURES/CONFIDENTIALITY: You and/or your care partner have signed paperwork which will be entered into your electronic medical record.  These signatures attest to the fact that that the information above on your After Visit Summary has been reviewed and is understood.  Full responsibility of the confidentiality of this discharge information lies with you  and/or your care-partner.    Diverticulosis and high fiber diet information given.  Next colonoscopy 10 years-2023

## 2012-06-17 NOTE — Progress Notes (Signed)
Patient did not experience any of the following events: a burn prior to discharge; a fall within the facility; wrong site/side/patient/procedure/implant event; or a hospital transfer or hospital admission upon discharge from the facility. (G8907) Patient did not have preoperative order for IV antibiotic SSI prophylaxis. (G8918)  

## 2012-06-17 NOTE — Op Note (Signed)
[facility] [address] [place], [address]   COLONOSCOPY PROCEDURE REPORT  PATIENT: Justin Pennington, Justin Pennington.  MR#: [id number] BIRTHDATE: Feb 19, 1961 , 51  yrs. old GENDER: Male ENDOSCOPIST: Iva Boop, MD, [facility] PROCEDURE DATE:  06/17/2012 PROCEDURE:   Colonoscopy, diagnostic ASA CLASS:   Class II INDICATIONS:average risk screening. MEDICATIONS: propofol (Diprivan) 200mg  IV, MAC sedation, administered by CRNA, and These medications were titrated to patient response per physician's verbal order  DESCRIPTION OF PROCEDURE:   After the risks benefits and alternatives of the procedure were thoroughly explained, informed consent was obtained.  A digital rectal exam revealed several skin tags and possible condylomata, A digital rectal exam revealed no rectal mass, and A digital rectal exam revealed the prostate was not enlarged.   The LB [id number]  endoscope was introduced through the anus and advanced to the cecum, which was identified by both the appendix and ileocecal valve. No adverse events experienced.   The quality of the prep was Suprep excellent  The instrument was then slowly withdrawn as the colon was fully examined.      COLON FINDINGS: Moderate diverticulosis was noted in the sigmoid colon.   Mild diverticulosis was noted The finding was in the right colon.   The colon mucosa was otherwise normal.  Retroflexed views revealed no abnormalities. The time to cecum=1 minutes 16 seconds. Withdrawal time=9 minutes 06 seconds.  The scope was withdrawn and the procedure completed. COMPLICATIONS: There were no complications.  ENDOSCOPIC IMPRESSION: 1.   Moderate diverticulosis was noted in the sigmoid colon 2.   Mild diverticulosis was noted in the right colon 3.   The colon mucosa was otherwise normal excellent prep  RECOMMENDATIONS: Repeat Colonscopy in 10 years.   eSigned:  Iva Boop, MD, [facility] 06/17/2012 12:09 PM   cc: Rudi Heap, MD and  The Patient

## 2012-06-20 ENCOUNTER — Telehealth: Payer: Self-pay | Admitting: *Deleted

## 2012-06-20 NOTE — Telephone Encounter (Signed)
  Follow up Call-  Call back number 06/17/2012  Post procedure Call Back phone  # [phone]  Permission to leave phone message Yes     Patient questions:  Do you have a fever, pain , or abdominal swelling? no Pain Score  0 *  Have you tolerated food without any problems? yes  Have you been able to return to your normal activities? yes  Do you have any questions about your discharge instructions: Diet   no Medications  no Follow up visit  no  Do you have questions or concerns about your Care? no  Actions: * If pain score is 4 or above: No action needed, pain <4.

## 2012-07-13 ENCOUNTER — Other Ambulatory Visit: Payer: Self-pay | Admitting: Internal Medicine

## 2012-07-13 MED ORDER — FLUTICASONE-SALMETEROL 100-50 MCG/DOSE IN AEPB: INHALATION_SPRAY | RESPIRATORY_TRACT | Status: DC

## 2012-07-13 MED ORDER — MONTELUKAST SODIUM 10 MG PO TABS: 10.0000 mg | ORAL_TABLET | Freq: Every day | ORAL | Status: DC

## 2012-07-13 NOTE — Telephone Encounter (Signed)
Pt has appt 09-07-12

## 2012-07-13 NOTE — Telephone Encounter (Signed)
Pharmacy requesting rx for advair  Pt has appt 09-07-12 rx sent

## 2012-09-07 ENCOUNTER — Ambulatory Visit: Payer: PRIVATE HEALTH INSURANCE | Admitting: Internal Medicine

## 2012-12-15 ENCOUNTER — Telehealth: Payer: Self-pay | Admitting: Internal Medicine

## 2012-12-15 ENCOUNTER — Other Ambulatory Visit: Payer: Self-pay | Admitting: Internal Medicine

## 2012-12-15 MED ORDER — FLUTICASONE-SALMETEROL 100-50 MCG/DOSE IN AEPB: INHALATION_SPRAY | RESPIRATORY_TRACT | Status: DC

## 2012-12-15 NOTE — Telephone Encounter (Signed)
Pt aware rx sent. Nothing further was needed

## 2013-01-04 ENCOUNTER — Other Ambulatory Visit: Payer: Self-pay | Admitting: Family Medicine

## 2013-01-04 NOTE — Telephone Encounter (Signed)
Refill this prescription but he also has an appointment to come in and be seen. Schedule this with Joyce Gross.

## 2013-01-04 NOTE — Telephone Encounter (Signed)
LAST LABS 11/13 

## 2013-02-09 ENCOUNTER — Other Ambulatory Visit: Payer: Self-pay | Admitting: Family Medicine

## 2013-04-04 ENCOUNTER — Other Ambulatory Visit: Payer: Self-pay | Admitting: Internal Medicine

## 2013-06-12 ENCOUNTER — Other Ambulatory Visit: Payer: Self-pay | Admitting: Internal Medicine

## 2013-06-12 ENCOUNTER — Other Ambulatory Visit: Payer: Self-pay | Admitting: Family Medicine

## 2013-06-15 NOTE — Telephone Encounter (Signed)
NO LABS OR VISIT SINCE 11/13

## 2013-06-15 NOTE — Telephone Encounter (Signed)
I am oking this prescription. Try to get patient  an appointment to see me to get lab work done .

## 2013-06-16 NOTE — Telephone Encounter (Signed)
Pt aware rx called in & appt made

## 2013-06-28 ENCOUNTER — Ambulatory Visit (INDEPENDENT_AMBULATORY_CARE_PROVIDER_SITE_OTHER): Payer: PRIVATE HEALTH INSURANCE | Admitting: Family Medicine

## 2013-06-28 ENCOUNTER — Ambulatory Visit (INDEPENDENT_AMBULATORY_CARE_PROVIDER_SITE_OTHER): Payer: PRIVATE HEALTH INSURANCE

## 2013-06-28 ENCOUNTER — Encounter: Payer: Self-pay | Admitting: Family Medicine

## 2013-06-28 VITALS — BP 143/82 | HR 74 | Temp 98.1°F | Ht 69.0 in | Wt 181.4 lb

## 2013-06-28 DIAGNOSIS — J4489 Other specified chronic obstructive pulmonary disease: Secondary | ICD-10-CM

## 2013-06-28 DIAGNOSIS — Z0000 Encounter for general adult medical examination without abnormal findings: Secondary | ICD-10-CM

## 2013-06-28 DIAGNOSIS — E785 Hyperlipidemia, unspecified: Secondary | ICD-10-CM

## 2013-06-28 DIAGNOSIS — J309 Allergic rhinitis, unspecified: Secondary | ICD-10-CM

## 2013-06-28 DIAGNOSIS — J449 Chronic obstructive pulmonary disease, unspecified: Secondary | ICD-10-CM

## 2013-06-28 DIAGNOSIS — N400 Enlarged prostate without lower urinary tract symptoms: Secondary | ICD-10-CM

## 2013-06-28 LAB — POCT URINALYSIS DIPSTICK
Bilirubin, UA: NEGATIVE
Blood, UA: NEGATIVE
Glucose, UA: NEGATIVE
Ketones, UA: NEGATIVE
Leukocytes, UA: NEGATIVE
Nitrite, UA: NEGATIVE
Protein, UA: NEGATIVE
Spec Grav, UA: 1.015
Urobilinogen, UA: NEGATIVE
pH, UA: 6.5

## 2013-06-28 LAB — POCT UA - MICROSCOPIC ONLY
Bacteria, U Microscopic: NEGATIVE
Casts, Ur, LPF, POC: NEGATIVE
Crystals, Ur, HPF, POC: NEGATIVE
Epithelial cells, urine per micros: NEGATIVE
Mucus, UA: NEGATIVE
RBC, urine, microscopic: NEGATIVE
WBC, Ur, HPF, POC: NEGATIVE
Yeast, UA: NEGATIVE

## 2013-06-28 NOTE — Progress Notes (Signed)
Subjective:    Patient ID: Justin Pennington, male    DOB: 03/20/1961, 52 y.o.   MRN: [id number]  HPI Patient here today for annual exam. This patient has a history of allergic rhinitis, asthma, and hyperlipidemia.      Patient Active Problem List   Diagnosis Date Noted  . Hyperlipemia 06/28/2013  . ALLERGIC RHINITIS 09/05/2010  . ALLERGIC CONJUNCTIVITIS 06/12/2007  . CHRONIC OBSTRUCTIVE ASTHMA UNSPECIFIED 06/12/2007  . INSOMNIA UNSPECIFIED 06/12/2007  . IRRITABLE BOWEL SYNDROME, HX OF 06/12/2007   Outpatient Encounter Prescriptions as of 06/28/2013  Medication Sig  . atorvastatin (LIPITOR) 10 MG tablet TAKE 1 TABLET BY MOUTH DAILY FOR CHOLESTEROL  . cholecalciferol (VITAMIN D) 1000 UNITS tablet Take 1,000 Units by mouth daily.  . Fluticasone-Salmeterol (ADVAIR DISKUS) 100-50 MCG/DOSE AEPB USE 1 INHALATION  INTO THE LUNGS TWICE DAILY.  . montelukast (SINGULAIR) 10 MG tablet TAKE 1 TABLET (10 MG TOTAL) BY MOUTH AT BEDTIME.  Marland Kitchen Omega-3 Fatty Acids (FISH OIL) 1200 MG CAPS Take 1 capsule by mouth daily.  Marland Kitchen albuterol (PROAIR HFA) 108 (90 BASE) MCG/ACT inhaler Inhale 2 puffs into the lungs every 6 (six) hours as needed for wheezing or shortness of breath.  . [DISCONTINUED] dicyclomine (BENTYL) 20 MG tablet Take 1 tablet (20 mg total) by mouth every 6 (six) hours as needed.    Review of Systems  Constitutional: Negative.   HENT: Negative.   Eyes: Negative.   Respiratory: Negative.   Cardiovascular: Negative.   Gastrointestinal: Negative.   Endocrine: Negative.   Genitourinary: Negative.   Musculoskeletal: Negative.   Skin: Negative.   Allergic/Immunologic: Negative.   Neurological: Negative.   Hematological: Negative.   Psychiatric/Behavioral: Negative.        Objective:   Physical Exam  Nursing note and vitals reviewed. Constitutional: He is oriented to person, place, and time. He appears well-developed and well-nourished. No distress.  HENT:  Head: Normocephalic and  atraumatic.  Right Ear: External ear normal.  Left Ear: External ear normal.  Mouth/Throat: Oropharynx is clear and moist. No oropharyngeal exudate.  Nasal congestion and redness bilaterally  Eyes: Conjunctivae and EOM are normal. Pupils are equal, round, and reactive to light. Right eye exhibits no discharge. Left eye exhibits no discharge. No scleral icterus.  Neck: Normal range of motion. Neck supple. No tracheal deviation present. No thyromegaly present.  No carotid bruits  Cardiovascular: Normal rate, regular rhythm, normal heart sounds and intact distal pulses.  Exam reveals no gallop and no friction rub.   No murmur heard.  At 72 per minute  Pulmonary/Chest: Effort normal and breath sounds normal. No respiratory distress. He has no wheezes. He has no rales. He exhibits no tenderness.  Abdominal: Soft. Bowel sounds are normal. He exhibits no mass. There is no tenderness. There is no rebound and no guarding.  Genitourinary: Rectum normal and penis normal.  Prostate was moderately enlarged but smooth without lumps or masses. There were no rectal masses. There are no inguinal hernias. External genitalia were normal.  Musculoskeletal: Normal range of motion. He exhibits no edema and no tenderness.  Lymphadenopathy:    He has no cervical adenopathy.  Neurological: He is alert and oriented to person, place, and time. He has normal reflexes. No cranial nerve deficit.  Skin: Skin is warm and dry. No rash noted. No erythema. No pallor.  Psychiatric: He has a normal mood and affect. His behavior is normal. Judgment and thought content normal.   BP 143/82  Pulse 74  Temp(Src)  98.1 F (36.7 C) (Oral)  Ht 5\' 9"  (1.753 m)  Wt 181 lb 6.4 oz (82.283 kg)  BMI 26.78 kg/m2  [facility] reading (PRIMARY) by  Dr. Tracie Harrier -no active disease                                       Assessment & Plan:  1. Annual physical exam - DG Chest 2 View; Future - POCT UA - Microscopic Only - POCT urinalysis  dipstick - PSA, total and free - Vit D  25 hydroxy (rtn osteoporosis monitoring) - CBC with Differential  2. Hyperlipidemia - BMP8+EGFR - Hepatic function panel - NMR, lipoprofile - CBC with Differential  3. CHRONIC OBSTRUCTIVE ASTHMA UNSPECIFIED - CBC with Differential  4. Hyperlipemia - CBC with Differential  5. BPH (benign prostatic hyperplasia) -PSA today  6. Allergic rhinitis  Patient Instructions  Continue current medications. Continue good therapeutic lifestyle changes which include good diet and exercise. Fall precautions discussed with patient. Schedule your flu vaccine if you haven't had it yet If you are over 53 years old - you may need Prevnar 13 or the adult Pneumonia vaccine. Check with your insurance regarding the Prevnar vaccine Remember for you because of the family history we should at least do a urinalysis twice yearly Try to use her medications for allergic rhinitis on a more regular basis Using a cool mist humidifier in her bedroom at nighttime may be helpful for adding more moisture to [place] and arm   Nyra Capes MD

## 2013-06-28 NOTE — Patient Instructions (Addendum)
Continue current medications. Continue good therapeutic lifestyle changes which include good diet and exercise. Fall precautions discussed with patient. Schedule your flu vaccine if you haven't had it yet If you are over 53 years old - you may need Prevnar 13 or the adult Pneumonia vaccine. Check with your insurance regarding the Prevnar vaccine Remember for you because of the family history we should at least do a urinalysis twice yearly Try to use her medications for allergic rhinitis on a more regular basis Using a cool mist humidifier in her bedroom at nighttime may be helpful for adding more moisture to [place] and arm

## 2013-06-28 NOTE — Addendum Note (Signed)
Addended by: Magdalene River on: 06/28/2013 10:59 AM   Modules accepted: Orders

## 2013-06-29 LAB — CBC WITH DIFFERENTIAL/PLATELET
Basophils Absolute: 0 10*3/uL (ref 0.0–0.2)
Basos: 0 %
Eos: 3 %
Eosinophils Absolute: 0.2 10*3/uL (ref 0.0–0.4)
HCT: 45.3 % (ref 37.5–51.0)
Hemoglobin: 15.9 g/dL (ref 12.6–17.7)
Immature Grans (Abs): 0 10*3/uL (ref 0.0–0.1)
Immature Granulocytes: 0 %
Lymphocytes Absolute: 2.3 10*3/uL (ref 0.7–3.1)
Lymphs: 33 %
MCH: 29.4 pg (ref 26.6–33.0)
MCHC: 35.1 g/dL (ref 31.5–35.7)
MCV: 84 fL (ref 79–97)
Monocytes Absolute: 0.6 10*3/uL (ref 0.1–0.9)
Monocytes: 9 %
Neutrophils Absolute: 3.8 10*3/uL (ref 1.4–7.0)
Neutrophils Relative %: 55 %
RBC: 5.41 x10E6/uL (ref 4.14–5.80)
RDW: 14 % (ref 12.3–15.4)
WBC: 6.9 10*3/uL (ref 3.4–10.8)

## 2013-06-30 LAB — HEPATIC FUNCTION PANEL
ALT: 48 IU/L — ABNORMAL HIGH (ref 0–44)
AST: 26 IU/L (ref 0–40)
Albumin: 4.4 g/dL (ref 3.5–5.5)
Alkaline Phosphatase: 70 IU/L (ref 39–117)
Bilirubin, Direct: 0.17 mg/dL (ref 0.00–0.40)
Total Bilirubin: 0.6 mg/dL (ref 0.0–1.2)
Total Protein: 7 g/dL (ref 6.0–8.5)

## 2013-06-30 LAB — BMP8+EGFR
BUN/Creatinine Ratio: 11 (ref 9–20)
BUN: 12 mg/dL (ref 6–24)
CO2: 22 mmol/L (ref 18–29)
Calcium: 9.3 mg/dL (ref 8.7–10.2)
Chloride: 100 mmol/L (ref 97–108)
Creatinine, Ser: 1.12 mg/dL (ref 0.76–1.27)
GFR calc Af Amer: 86 mL/min/{1.73_m2} (ref >59)
GFR calc non Af Amer: 75 mL/min/{1.73_m2} (ref >59)
Glucose: 92 mg/dL (ref 65–99)
Potassium: 4.5 mmol/L (ref 3.5–5.2)
Sodium: 140 mmol/L (ref 134–144)

## 2013-06-30 LAB — NMR, LIPOPROFILE
Cholesterol: 179 mg/dL (ref <200)
HDL Cholesterol by NMR: 47 mg/dL (ref >=40)
HDL Particle Number: 35.3 umol/L (ref >=30.5)
LDL Particle Number: 1805 nmol/L — ABNORMAL HIGH (ref <1000)
LDL Size: 20.2 nm — ABNORMAL LOW (ref >20.5)
LDLC SERPL CALC-MCNC: 104 mg/dL — ABNORMAL HIGH (ref <100)
LP-IR Score: 72 — ABNORMAL HIGH (ref <=45)
Small LDL Particle Number: 1181 nmol/L — ABNORMAL HIGH (ref <=527)
Triglycerides by NMR: 138 mg/dL (ref <150)

## 2013-06-30 LAB — PSA, TOTAL AND FREE
PSA, Free Pct: 32.5 %
PSA, Free: 0.26 ng/mL
PSA: 0.8 ng/mL (ref 0.0–4.0)

## 2013-06-30 LAB — VITAMIN D 25 HYDROXY (VIT D DEFICIENCY, FRACTURES): Vit D, 25-Hydroxy: 25.2 ng/mL — ABNORMAL LOW (ref 30.0–100.0)

## 2013-07-12 ENCOUNTER — Other Ambulatory Visit: Payer: Self-pay | Admitting: Internal Medicine

## 2013-07-19 ENCOUNTER — Encounter: Payer: Self-pay | Admitting: Pharmacist

## 2013-07-19 ENCOUNTER — Ambulatory Visit (INDEPENDENT_AMBULATORY_CARE_PROVIDER_SITE_OTHER): Payer: BC Managed Care – PPO | Admitting: Pharmacist

## 2013-07-19 VITALS — BP 140/80 | HR 87 | Ht 69.0 in | Wt 186.0 lb

## 2013-07-19 DIAGNOSIS — E785 Hyperlipidemia, unspecified: Secondary | ICD-10-CM

## 2013-07-19 MED ORDER — ATORVASTATIN CALCIUM 20 MG PO TABS: 20.0000 mg | ORAL_TABLET | Freq: Every day | ORAL | Status: DC

## 2013-07-19 NOTE — Progress Notes (Signed)
Patient ID: Justin MoloneyDavid Pennington, male   DOB: 04/06/1961, 53 y.o.   MRN: [id number] Lipid Clinic Consultation  Chief Complaint:   Chief Complaint  Patient presents with  . Hyperlipidemia     HPI:  First visit to lipid clinic.  Patient with hyperlipidemia diagnosed about 2 years ago.  He has tried crestor in past but had myalgias in both legs.  He was switched to atorvastatin 10mg  daily and has taken for last year without any problems but LDL-P remains elevated.  Low fat diet followed?  No - eats eggs most mornings, 2 sodas a day Low carb diet followed?  No - see above Exercise?  No - but previsously was exercising more regularly (walking mostly)  Assessment: CHD/CHF Risk Equivalents:  none ASCVD 10 year risk = 6.9%,  Lifetime risk = 46% NCEP Risk Factors Present:  HTN and age Primary Problem(s):  LDL or LDL-P elevated  Current NCEP Goals: LDL Goal < 100 HDL Goal >/= 40 Tg Goal < 409150 Non-HDL Goal < 130   Exam Filed Vitals:   07/19/13 1029  BP: 140/80  Pulse: 87   Filed Weights   07/19/13 1029  Weight: 186 lb (84.369 kg)   Body mass index is 27.45 kg/(m^2).  Edema:  negatvie  Carotid Bruits:  Negative Appearance:  alert, oriented, no acute distress and well nourished Mood/Affect:  normal  Assessment:  Hyperlipidemia with elevated LDL-P  Recommendations: Changes in lipid medication(s):  Increase atorvastatin to 20mg  daily (would like to increase to 40mg  but concerned about history of myalgias with other statin) Discontinue fish oil Recheck Lipid Panel:  3 months Other labs needed:  LFTs  Time spent counseling patient:  30minutes    Justin Pennington, PharmD, CPP

## 2013-07-27 ENCOUNTER — Telehealth: Payer: Self-pay | Admitting: Internal Medicine

## 2013-07-27 MED ORDER — MONTELUKAST SODIUM 10 MG PO TABS: ORAL_TABLET | ORAL | Status: DC

## 2013-07-27 NOTE — Telephone Encounter (Signed)
Spoke with pt. Made him aware will send in RX but needs to keep pending appt. Nothing further needed

## 2013-08-17 ENCOUNTER — Telehealth: Payer: Self-pay | Admitting: Internal Medicine

## 2013-08-17 MED ORDER — FLUTICASONE-SALMETEROL 100-50 MCG/DOSE IN AEPB: INHALATION_SPRAY | RESPIRATORY_TRACT | Status: DC

## 2013-08-17 NOTE — Telephone Encounter (Signed)
Rx has been sent in. Advised pt that he needs to keep appointment for further fills. He agreed and his appointment date and time have been verified with him.

## 2013-08-24 ENCOUNTER — Encounter (INDEPENDENT_AMBULATORY_CARE_PROVIDER_SITE_OTHER): Payer: Self-pay

## 2013-08-24 ENCOUNTER — Encounter: Payer: Self-pay | Admitting: Internal Medicine

## 2013-08-24 ENCOUNTER — Ambulatory Visit (INDEPENDENT_AMBULATORY_CARE_PROVIDER_SITE_OTHER): Payer: BC Managed Care – PPO | Admitting: Internal Medicine

## 2013-08-24 VITALS — BP 136/80 | HR 69 | Ht 69.0 in | Wt 186.8 lb

## 2013-08-24 DIAGNOSIS — J4489 Other specified chronic obstructive pulmonary disease: Secondary | ICD-10-CM

## 2013-08-24 DIAGNOSIS — J449 Chronic obstructive pulmonary disease, unspecified: Secondary | ICD-10-CM

## 2013-08-24 DIAGNOSIS — H1045 Other chronic allergic conjunctivitis: Secondary | ICD-10-CM

## 2013-08-24 MED ORDER — MONTELUKAST SODIUM 10 MG PO TABS: ORAL_TABLET | ORAL | Status: DC

## 2013-08-24 MED ORDER — BUDESONIDE-FORMOTEROL FUMARATE 80-4.5 MCG/ACT IN AERO
INHALATION_SPRAY | RESPIRATORY_TRACT | Status: DC
Start: 2013-08-24 — End: 2014-05-17

## 2013-08-24 MED ORDER — FLUTICASONE-SALMETEROL 100-50 MCG/DOSE IN AEPB: INHALATION_SPRAY | RESPIRATORY_TRACT | Status: DC

## 2013-08-24 NOTE — Progress Notes (Signed)
09/07/11- 53 yo M never smoker followed for  LOV- 09/05/2010 Another good year with few respiratory problems. Eyes watered this winter, probably from overdrying. He sees no reason to change what he is doing. Continues Advair once daily. Exercises regularly and not aware of shortness of breath. No need for rescue inhaler. No PFT found in electronic file.  08/24/13- 52 yoM never smoker followed for chronic obstructive asthma, allergic conjunctivitis, allergic rhinitis FOLLOWS UJW:[id number]:last seen 09-07-2011; eyes bother him slightly this time of year but normally clears up and goes away. Denies any flare ups of his breathing. Advair is getting more expensive. We're going to price check Symbicort. He feels well controlled. CXR 06/28/13 IMPRESSION:  No active cardiopulmonary disease.  Electronically Signed  By: Marlan Palauharles Clark M.D.  On: 06/28/2013 11:43 Office spirometry 08/24/2013-moderately severe obstruction. FVC 3.64/77%, FEV1 2.13/57%, FEV1/FVC 0.59, FEF 25-75% 1.23/33%.  ROS-see HPI Constitutional:   No-   weight loss, night sweats, fevers, chills, fatigue, lassitude. HEENT:   No-  headaches, difficulty swallowing, tooth/dental problems, sore throat,       No-  sneezing, itching, ear ache, nasal congestion, post nasal drip,  CV:  No-   chest pain, orthopnea, PND, swelling in lower extremities, anasarca,  dizziness, palpitations Resp: +shortness of breath with exertion or at rest.              No-   productive cough,  No non-productive cough,  No- coughing up of blood.              No-   change in color of mucus.  No- wheezing.   Skin: No-   rash or lesions. GI:  No-   heartburn, indigestion, abdominal pain, nausea, GU: . MS:  No-   joint pain or swelling.  Neuro-     nothing unusual Psych:  No- change in mood or affect. No depression or anxiety.  No memory loss.  OBJ- Physical Exam General- Alert, Oriented, Affect-appropriate, Distress- none acute, fit-appearing Skin- rash-none, lesions- none,  excoriation- none Lymphadenopathy- none Head- atraumatic            Eyes- Gross vision intact, PERRLA, conjunctivae and secretions clear            Ears- Hearing, canals-normal            Nose- Clear, no-Septal dev, mucus, polyps, erosion, perforation.             Throat- Mallampati II , mucosa clear , drainage- none, tonsils- atrophic Neck- flexible , trachea midline, no stridor , thyroid nl, carotid no bruit Chest - symmetrical excursion , unlabored           Heart/CV- RRR , no murmur , no gallop  , no rub, nl s1 s2                           - JVD- none , edema- none, stasis changes- none, varices- none           Lung- clear to P&A/ diminished, wheeze- none, cough- none , dullness-none, rub- none           Chest wall-  Abd-  Br/ Gen/ Rectal- Not done, not indicated Extrem- cyanosis- none, clubbing, none, atrophy- none, strength- nl Neuro- grossly intact to observation

## 2013-08-24 NOTE — Patient Instructions (Addendum)
Order- Office spirometry- dx  Chronic obstructive asthma  Scripts sent for Advair and singulair  Script printed for Symbicort 80 to price- compare with Advair 100

## 2013-09-17 ENCOUNTER — Encounter: Payer: Self-pay | Admitting: Internal Medicine

## 2013-09-17 NOTE — Assessment & Plan Note (Signed)
Discussed antihistamines with spring season

## 2013-09-17 NOTE — Assessment & Plan Note (Signed)
Good control Plan-prescription for Symbicort to price compare with Advair

## 2013-10-11 ENCOUNTER — Telehealth: Payer: Self-pay | Admitting: *Deleted

## 2013-10-11 DIAGNOSIS — E785 Hyperlipidemia, unspecified: Secondary | ICD-10-CM

## 2013-10-11 NOTE — Telephone Encounter (Signed)
Pt calls and states that he was having horrible pain  And could not tolerate lipitor or crestor Pt told to stop and repeat labs end on May and dwm aware- we will address again when those labs come back!

## 2013-10-17 ENCOUNTER — Other Ambulatory Visit: Payer: Self-pay

## 2014-05-16 ENCOUNTER — Telehealth: Payer: Self-pay | Admitting: Family Medicine

## 2014-05-16 NOTE — Telephone Encounter (Signed)
Appointment given for 2:45 tomorrow with Christell ConstantMoore

## 2014-05-17 ENCOUNTER — Ambulatory Visit (INDEPENDENT_AMBULATORY_CARE_PROVIDER_SITE_OTHER): Payer: BC Managed Care – PPO | Admitting: Family Medicine

## 2014-05-17 ENCOUNTER — Encounter (INDEPENDENT_AMBULATORY_CARE_PROVIDER_SITE_OTHER): Payer: Self-pay

## 2014-05-17 ENCOUNTER — Encounter: Payer: Self-pay | Admitting: Family Medicine

## 2014-05-17 VITALS — BP 141/79 | HR 75 | Temp 97.7°F | Ht 69.0 in | Wt 184.0 lb

## 2014-05-17 DIAGNOSIS — R059 Cough, unspecified: Secondary | ICD-10-CM

## 2014-05-17 DIAGNOSIS — J209 Acute bronchitis, unspecified: Secondary | ICD-10-CM

## 2014-05-17 DIAGNOSIS — H6501 Acute serous otitis media, right ear: Secondary | ICD-10-CM

## 2014-05-17 DIAGNOSIS — J301 Allergic rhinitis due to pollen: Secondary | ICD-10-CM

## 2014-05-17 DIAGNOSIS — R05 Cough: Secondary | ICD-10-CM

## 2014-05-17 LAB — POCT CBC
Granulocyte percent: 71.4 %G (ref 37–80)
HCT, POC: 44.2 % (ref 43.5–53.7)
Hemoglobin: 14.9 g/dL (ref 14.1–18.1)
Lymph, poc: 1.8 (ref 0.6–3.4)
MCH, POC: 28.6 pg (ref 27–31.2)
MCHC: 33.6 g/dL (ref 31.8–35.4)
MCV: 85.1 fL (ref 80–97)
MPV: 8 fL (ref 0–99.8)
POC Granulocyte: 5.6 (ref 2–6.9)
POC LYMPH PERCENT: 23.1 %L (ref 10–50)
Platelet Count, POC: 211 10*3/uL (ref 142–424)
RBC: 5.2 M/uL (ref 4.69–6.13)
RDW, POC: 12.8 %
WBC: 7.8 10*3/uL (ref 4.6–10.2)

## 2014-05-17 MED ORDER — METHYLPREDNISOLONE ACETATE 80 MG/ML IJ SUSP
60.0000 mg | Freq: Once | INTRAMUSCULAR | Status: AC
  Administered 2014-05-17: 60 mg via INTRAMUSCULAR

## 2014-05-17 MED ORDER — PREDNISONE 10 MG PO TABS: ORAL_TABLET | ORAL | Status: DC

## 2014-05-17 MED ORDER — AZITHROMYCIN 250 MG PO TABS: ORAL_TABLET | ORAL | Status: DC

## 2014-05-17 NOTE — Patient Instructions (Addendum)
Drink plenty of fluids Take Tylenol or ibuprofen as needed for fever Use Mucinex as directed twice daily with a large glass of water Use saline nose spray regularly Take antibiotics as directed and prednisone if prescribed Restart Flonase nasal spray 1 spray each nostril daily

## 2014-05-17 NOTE — Progress Notes (Signed)
Subjective:    Patient ID: Justin Pennington, male    DOB: 06/16/1961, 53 y.o.   MRN: [id number]  HPI  Patient here today for right ear pain, cough, and chest congestion that started 1 week ago.He is also had wheezing. This patient has a history of asthmatic bronchitis. He has been using his Advair fairly regularly and the albuterol rescue inhaler as needed.         Patient Active Problem List   Diagnosis Date Noted  . Hyperlipemia 06/28/2013  . BPH (benign prostatic hyperplasia) 06/28/2013  . ALLERGIC RHINITIS 09/05/2010  . ALLERGIC CONJUNCTIVITIS 06/12/2007  . CHRONIC OBSTRUCTIVE ASTHMA UNSPECIFIED 06/12/2007  . INSOMNIA UNSPECIFIED 06/12/2007  . IRRITABLE BOWEL SYNDROME, HX OF 06/12/2007   Outpatient Encounter Prescriptions as of 05/17/2014  Medication Sig  . albuterol (PROAIR HFA) 108 (90 BASE) MCG/ACT inhaler Inhale into the lungs every 6 (six) hours as needed for wheezing or shortness of breath.  . Fluticasone-Salmeterol (ADVAIR DISKUS) 100-50 MCG/DOSE AEPB 1 puff then rinse mouth, twice daily- maintenance  . [DISCONTINUED] albuterol (PROAIR HFA) 108 (90 BASE) MCG/ACT inhaler Inhale 2 puffs into the lungs every 6 (six) hours as needed for wheezing or shortness of breath.  . [DISCONTINUED] atorvastatin (LIPITOR) 20 MG tablet Take 1 tablet (20 mg total) by mouth daily.  . [DISCONTINUED] budesonide-formoterol (SYMBICORT) 80-4.5 MCG/ACT inhaler 2 puffs then rinse mouth, twice daily  . [DISCONTINUED] Fluticasone-Salmeterol (ADVAIR DISKUS) 100-50 MCG/DOSE AEPB USE 1 INHALATION  INTO THE LUNGS TWICE DAILY.  . [DISCONTINUED] montelukast (SINGULAIR) 10 MG tablet TAKE 1 TABLET (10 MG TOTAL) BY MOUTH AT BEDTIME.    Review of Systems  Constitutional: Negative.  Negative for fever.  HENT: Positive for congestion and ear pain (right ).   Eyes: Negative.   Respiratory: Positive for cough, chest tightness and wheezing.   Cardiovascular: Negative.   Gastrointestinal: Negative.     Endocrine: Negative.   Genitourinary: Negative.   Musculoskeletal: Negative.   Skin: Negative.   Allergic/Immunologic: Negative.   Neurological: Negative.   Hematological: Negative.   Psychiatric/Behavioral: Negative.        Objective:   Physical Exam  Constitutional: He is oriented to person, place, and time. He appears well-developed and well-nourished. No distress.  HENT:  Head: Normocephalic and atraumatic.  Left Ear: External ear normal.  Mouth/Throat: No oropharyngeal exudate.  The throat is red posteriorly to the right TM is dull in appearance  Eyes: Conjunctivae and EOM are normal. Pupils are equal, round, and reactive to light. Right eye exhibits no discharge. Left eye exhibits no discharge. No scleral icterus.  Neck: Normal range of motion. Neck supple. No thyromegaly present.  Cardiovascular: Normal rate and normal heart sounds.   No murmur heard. Pulmonary/Chest: Effort normal and breath sounds normal. No respiratory distress. He has no wheezes. He has no rales.  Dry cough  Musculoskeletal: Normal range of motion.  Neurological: He is alert and oriented to person, place, and time.  Skin: Skin is warm. No rash noted.  Skin is somewhat clammy even though he is not running a fever.  Psychiatric: He has a normal mood and affect. His behavior is normal. Judgment and thought content normal.  Nursing note and vitals reviewed.   BP 141/79 mmHg  Pulse 75  Temp(Src) 97.7 F (36.5 C) (Oral)  Ht 5\' 9"  (1.753 m)  Wt 184 lb (83.462 kg)  BMI 27.16 kg/m2  The CBC had a normal white blood cell count and a good hemoglobin of 14.9. The platelet  count was not elevated. This result was discussed with the patient before he left the office.     Assessment & Plan:  1. Cough - POCT CBC - predniSONE (DELTASONE) 10 MG tablet; 1 tablet 4 times a day for 2 days,  1 tablet 3 times a day for 2 days,  1 tablet 2 times a day for 2 days, 1 tablet daily for 2 days  Dispense: 20 tablet;  Refill: 0 - methylPREDNISolone acetate (DEPO-MEDROL) injection 60 mg; Inject 0.75 mLs (60 mg total) into the muscle once.  2. Bronchospasm with bronchitis, acute - azithromycin (ZITHROMAX) 250 MG tablet; 2 pills the first day then 1 daily for infection until completed  Dispense: 6 tablet; Refill: 0 - predniSONE (DELTASONE) 10 MG tablet; 1 tablet 4 times a day for 2 days,  1 tablet 3 times a day for 2 days,  1 tablet 2 times a day for 2 days, 1 tablet daily for 2 days  Dispense: 20 tablet; Refill: 0 - methylPREDNISolone acetate (DEPO-MEDROL) injection 60 mg; Inject 0.75 mLs (60 mg total) into the muscle once.  3. Allergic rhinitis due to pollen  4. Right acute serous otitis media, recurrence not specified  Patient Instructions  Drink plenty of fluids Take Tylenol or ibuprofen as needed for fever Use Mucinex as directed twice daily with a large glass of water Use saline nose spray regularly Take antibiotics as directed and prednisone if prescribed Restart Flonase nasal spray 1 spray each nostril daily   Nyra Capeson W. Moore MD

## 2014-05-21 ENCOUNTER — Ambulatory Visit: Payer: BC Managed Care – PPO | Admitting: Family Medicine

## 2014-05-25 ENCOUNTER — Telehealth: Payer: Self-pay | Admitting: Internal Medicine

## 2014-05-25 MED ORDER — ALBUTEROL SULFATE HFA 108 (90 BASE) MCG/ACT IN AERS: 2.0000 | INHALATION_SPRAY | Freq: Four times a day (QID) | RESPIRATORY_TRACT | Status: DC | PRN

## 2014-05-25 NOTE — Telephone Encounter (Signed)
Pt informed that rx for Proair was sent to pharmacy.

## 2014-08-23 ENCOUNTER — Ambulatory Visit: Payer: PRIVATE HEALTH INSURANCE | Admitting: Internal Medicine

## 2014-09-06 ENCOUNTER — Other Ambulatory Visit: Payer: Self-pay | Admitting: Internal Medicine

## 2014-10-09 ENCOUNTER — Encounter (INDEPENDENT_AMBULATORY_CARE_PROVIDER_SITE_OTHER): Payer: Self-pay

## 2014-10-09 ENCOUNTER — Ambulatory Visit (INDEPENDENT_AMBULATORY_CARE_PROVIDER_SITE_OTHER): Payer: BLUE CROSS/BLUE SHIELD | Admitting: Internal Medicine

## 2014-10-09 ENCOUNTER — Encounter: Payer: Self-pay | Admitting: Internal Medicine

## 2014-10-09 VITALS — BP 110/70 | HR 63 | Ht 70.0 in | Wt 182.8 lb

## 2014-10-09 DIAGNOSIS — J309 Allergic rhinitis, unspecified: Secondary | ICD-10-CM

## 2014-10-09 DIAGNOSIS — H1013 Acute atopic conjunctivitis, bilateral: Secondary | ICD-10-CM

## 2014-10-09 DIAGNOSIS — J449 Chronic obstructive pulmonary disease, unspecified: Secondary | ICD-10-CM | POA: Diagnosis not present

## 2014-10-09 DIAGNOSIS — J4489 Other specified chronic obstructive pulmonary disease: Secondary | ICD-10-CM

## 2014-10-09 MED ORDER — ALBUTEROL SULFATE HFA 108 (90 BASE) MCG/ACT IN AERS: 2.0000 | INHALATION_SPRAY | Freq: Four times a day (QID) | RESPIRATORY_TRACT | Status: DC | PRN

## 2014-10-09 MED ORDER — FLUTICASONE-SALMETEROL 100-50 MCG/DOSE IN AEPB: INHALATION_SPRAY | RESPIRATORY_TRACT | Status: DC

## 2014-10-09 NOTE — Progress Notes (Signed)
09/07/11- 54 yo M never smoker followed for  LOV- 09/05/2010 Another good year with few respiratory problems. Eyes watered this winter, probably from overdrying. He sees no reason to change what he is doing. Continues Advair once daily. Exercises regularly and not aware of shortness of breath. No need for rescue inhaler. No PFT found in electronic file.  08/24/13- 52 yoM never smoker followed for chronic obstructive asthma, allergic conjunctivitis, allergic rhinitis FOLLOWS NWG:NFAOFOR:last seen 09-07-2011; eyes bother him slightly this time of year but normally clears up and goes away. Denies any flare ups of his breathing. Advair is getting more expensive. We're going to price check Symbicort. He feels well controlled. CXR 06/28/13 IMPRESSION:  No active cardiopulmonary disease.  Electronically Signed  By: Marlan Palauharles Clark M.D.  On: 06/28/2013 11:43 Office spirometry 08/24/2013-moderately severe obstruction. FVC 3.64/77%, FEV1 2.13/57%, FEV1/FVC 0.59, FEF 25-75% 1.23/33%.  10/09/14- 52 yoM never smoker followed for chronic obstructive asthma, allergic conjunctivitis, allergic rhinitis FOLLOWS UP: Pt denies any flare ups. Pt state breathing has stayed the same since last OV.  No acute respiratory illness this winter. He quit Singulair and notices no difference. Has not needed rescue inhaler in months but continues Advair. Spring rhinitis symptoms have been minimal so far.  ROS-see HPI Constitutional:   No-   weight loss, night sweats, fevers, chills, fatigue, lassitude. HEENT:   No-  headaches, difficulty swallowing, tooth/dental problems, sore throat,       No-  sneezing, itching, ear ache, nasal congestion, post nasal drip,  CV:  No-   chest pain, orthopnea, PND, swelling in lower extremities, anasarca,  dizziness, palpitations Resp: +shortness of breath with exertion or at rest.              No-   productive cough,  No non-productive cough,  No- coughing up of blood.              No-   change in color of  mucus.  No- wheezing.   Skin: No-   rash or lesions. GI:  No-   heartburn, indigestion, abdominal pain, nausea, GU: . MS:  No-   joint pain or swelling.  Neuro-     nothing unusual Psych:  No- change in mood or affect. No depression or anxiety.  No memory loss.  OBJ- Physical Exam General- Alert, Oriented, Affect-appropriate, Distress- none acute, fit-appearing Skin- rash-none, lesions- none, excoriation- none Lymphadenopathy- none Head- atraumatic            Eyes- Gross vision intact, PERRLA, conjunctivae and secretions clear            Ears- Hearing, canals-normal            Nose- Clear, no-Septal dev, mucus, polyps, erosion, perforation.             Throat- Mallampati II , mucosa clear , drainage- none, tonsils- atrophic Neck- flexible , trachea midline, no stridor , thyroid nl, carotid no bruit Chest - symmetrical excursion , unlabored           Heart/CV- RRR , no murmur , no gallop  , no rub, nl s1 s2                           - JVD- none , edema- none, stasis changes- none, varices- none           Lung- clear to P&A/ diminished, wheeze- none, cough- none , dullness-none, rub- none  Chest wall-  Abd-  Br/ Gen/ Rectal- Not done, not indicated Extrem- cyanosis- none, clubbing, none, atrophy- none, strength- nl Neuro- grossly intact to observation

## 2014-10-09 NOTE — Patient Instructions (Signed)
Refill scripts sent  We can continue Advair and Proair inhalers. Watch for changes in your insurance formulary, so we can change as needed to stay with the cheapest that works for you.

## 2014-10-09 NOTE — Assessment & Plan Note (Signed)
Moderate chronic obstructive airways disease in a nonsmoker without identified cause other than suspected oh called asthma as a younger man. Incidental smoke and dust exposures he has been very stable. Advair has seemed to help but he is not needing rescue inhaler.

## 2014-10-09 NOTE — Assessment & Plan Note (Signed)
Mild itching and burning this spring. He can use antihistamines when necessary

## 2015-04-09 DIAGNOSIS — Z23 Encounter for immunization: Secondary | ICD-10-CM

## 2015-05-08 ENCOUNTER — Other Ambulatory Visit: Payer: Self-pay | Admitting: Internal Medicine

## 2015-07-08 ENCOUNTER — Telehealth: Payer: Self-pay | Admitting: Pulmonary Disease

## 2015-07-08 DIAGNOSIS — J209 Acute bronchitis, unspecified: Secondary | ICD-10-CM

## 2015-07-08 MED ORDER — PREDNISONE 10 MG PO TABS
ORAL_TABLET | ORAL | Status: DC
Start: 2015-07-08 — End: 2015-07-16

## 2015-07-08 NOTE — Telephone Encounter (Signed)
Called about bronchitis. Had cough, sputum. No fevers, chills. On Amoxicillin abx for 12 days with improvement in cough but has persistent dyspnea, wheezing.   Will call in Prednisone taper starting at 40 mg. Reduce dose by 10 mg every 2 days.

## 2015-07-16 ENCOUNTER — Ambulatory Visit (INDEPENDENT_AMBULATORY_CARE_PROVIDER_SITE_OTHER): Payer: BLUE CROSS/BLUE SHIELD | Admitting: Family Medicine

## 2015-07-16 VITALS — BP 149/85 | HR 76 | Temp 97.7°F | Ht 70.0 in | Wt 185.0 lb

## 2015-07-16 DIAGNOSIS — J449 Chronic obstructive pulmonary disease, unspecified: Secondary | ICD-10-CM

## 2015-07-16 DIAGNOSIS — J4489 Other specified chronic obstructive pulmonary disease: Secondary | ICD-10-CM

## 2015-07-16 DIAGNOSIS — J309 Allergic rhinitis, unspecified: Secondary | ICD-10-CM | POA: Diagnosis not present

## 2015-07-16 DIAGNOSIS — J45901 Unspecified asthma with (acute) exacerbation: Secondary | ICD-10-CM

## 2015-07-16 DIAGNOSIS — J45909 Unspecified asthma, uncomplicated: Secondary | ICD-10-CM

## 2015-07-16 MED ORDER — LEVOFLOXACIN 500 MG PO TABS: 500.0000 mg | ORAL_TABLET | Freq: Every day | ORAL | Status: DC

## 2015-07-16 MED ORDER — PREDNISONE 20 MG PO TABS
20.0000 mg | ORAL_TABLET | Freq: Every day | ORAL | Status: DC
Start: 2015-07-16 — End: 2015-10-28

## 2015-07-16 NOTE — Patient Instructions (Signed)
Great to meet you!  I am treating you for asthma exacerbation, you may have an underlying pneumonia which is why I have given you antibiotics.   Use the albuterol as needed  Please call or come back if you have any concerns  Community-Acquired Pneumonia, Adult Pneumonia is an infection of the lungs. There are different types of pneumonia. One type can develop while a person is in a hospital. A different type, called community-acquired pneumonia, develops in people who are not, or have not recently been, in the hospital or other health care facility.  CAUSES Pneumonia may be caused by bacteria, viruses, or funguses. Community-acquired pneumonia is often caused by Streptococcus pneumonia bacteria. These bacteria are often passed from one person to another by breathing in droplets from the cough or sneeze of an infected person. RISK FACTORS The condition is more likely to develop in:  People who havechronic diseases, such as chronic obstructive pulmonary disease (COPD), asthma, congestive heart failure, cystic fibrosis, diabetes, or kidney disease.  People who haveearly-stage or late-stage HIV.  People who havesickle cell disease.  People who havehad their spleen removed (splenectomy).  People who havepoor Administrator.  People who havemedical conditions that increase the risk of breathing in (aspirating) secretions their own mouth and nose.   People who havea weakened immune system (immunocompromised).  People who smoke.  People whotravel to areas where pneumonia-causing germs commonly exist.  People whoare around animal habitats or animals that have pneumonia-causing germs, including birds, bats, rabbits, cats, and farm animals. SYMPTOMS Symptoms of this condition include:  Adry cough.  A wet (productive) cough.  Fever.  Sweating.  Chest pain, especially when breathing deeply or coughing.  Rapid breathing or difficulty breathing.  Shortness of  breath.  Shaking chills.  Fatigue.  Muscle aches. DIAGNOSIS Your health care provider will take a medical history and perform a physical exam. You may also have other tests, including:  Imaging studies of your chest, including X-rays.  Tests to check your blood oxygen level and other blood gases.  Other tests on blood, mucus (sputum), fluid around your lungs (pleural fluid), and urine. If your pneumonia is severe, other tests may be done to identify the specific cause of your illness. TREATMENT The type of treatment that you receive depends on many factors, such as the cause of your pneumonia, the medicines you take, and other medical conditions that you have. For most adults, treatment and recovery from pneumonia may occur at home. In some cases, treatment must happen in a hospital. Treatment may include:  Antibiotic medicines, if the pneumonia was caused by bacteria.  Antiviral medicines, if the pneumonia was caused by a virus.  Medicines that are given by mouth or through an IV tube.  Oxygen.  Respiratory therapy. Although rare, treating severe pneumonia may include:  Mechanical ventilation. This is done if you are not breathing well on your own and you cannot maintain a safe blood oxygen level.  Thoracentesis. This procedureremoves fluid around one lung or both lungs to help you breathe better. HOME CARE INSTRUCTIONS  Take over-the-counter and prescription medicines only as told by your health care provider.  Only takecough medicine if you are losing sleep. Understand that cough medicine can prevent your body's natural ability to remove mucus from your lungs.  If you were prescribed an antibiotic medicine, take it as told by your health care provider. Do not stop taking the antibiotic even if you start to feel better.  Sleep in a semi-upright position at  night. Try sleeping in a reclining chair, or place a few pillows under your head.  Do not use tobacco products,  including cigarettes, chewing tobacco, and e-cigarettes. If you need help quitting, ask your health care provider.  Drink enough water to keep your urine clear or pale yellow. This will help to thin out mucus secretions in your lungs. PREVENTION There are ways that you can decrease your risk of developing community-acquired pneumonia. Consider getting a pneumococcal vaccine if:  You are older than 56 years of age.  You are older than 56 years of age and are undergoing cancer treatment, have chronic lung disease, or have other medical conditions that affect your immune system. Ask your health care provider if this applies to you. There are different types and schedules of pneumococcal vaccines. Ask your health care provider which vaccination option is best for you. You may also prevent community-acquired pneumonia if you take these actions:  Get an influenza vaccine every year. Ask your health care provider which type of influenza vaccine is best for you.  Go to the dentist on a regular basis.  Wash your hands often. Use hand sanitizer if soap and water are not available. SEEK MEDICAL CARE IF:  You have a fever.  You are losing sleep because you cannot control your cough with cough medicine. SEEK IMMEDIATE MEDICAL CARE IF:  You have worsening shortness of breath.  You have increased chest pain.  Your sickness becomes worse, especially if you are an older adult or have a weakened immune system.  You cough up blood.   This information is not intended to replace advice given to you by your health care provider. Make sure you discuss any questions you have with your health care provider.   Document Released: 06/22/2005 Document Revised: 03/13/2015 Document Reviewed: 10/17/2014 Elsevier Interactive Patient Education [employer]2016 [employer] Inc.

## 2015-07-16 NOTE — Progress Notes (Signed)
   HPI  Patient presents today here today with cough and congestion.  Patient signs of present illness for about 3 weeks. He was febrile prior to Christmas and seen when he was prescribed amoxicillin. He completed the course in and called his pulmonologist who prescribed a course of steroids.  He continues to be ill with shortness of breath, cough, malaise, frequent throat clearing.  His albuterol but easily using it once every 1 or 2 days.   PMH: Smoking status noted ROS: Per HPI  Objective: BP 149/85 mmHg  Pulse 76  Temp(Src) 97.7 F (36.5 C) (Oral)  Ht 5\' 10"  (1.778 m)  Wt 185 lb (83.915 kg)  BMI 26.54 kg/m2 Gen: NAD, alert, cooperative with exam HEENT: NCAT, nares with erythema and swelling bilaterally, TMs normal bilaterally CV: RRR, good S1/S2, no murmur Resp: Nonlabored, some scattered coarse sounds but nothing discrete or focal Ext: No edema, warm Neuro: Alert and oriented, No gross deficits  Assessment and plan:  # Asthma exacerbation Considering frequent cough and prolonged illness and failure of one antibiotic course I will go ahead and broaden coverage withLevaquin. Prednisone X 5 days RTC if worsening or does not improve as expected Discussed probiotics     Meds ordered this encounter  Medications  . predniSONE (DELTASONE) 20 MG tablet    Sig: Take 1 tablet (20 mg total) by mouth daily with breakfast.    Dispense:  10 tablet    Refill:  0  . levofloxacin (LEVAQUIN) 500 MG tablet    Sig: Take 1 tablet (500 mg total) by mouth daily.    Dispense:  7 tablet    Refill:  0    Murtis SinkSam Jeanclaude Wentworth, MD [facility] 07/16/2015, 5:37 PM

## 2015-10-09 ENCOUNTER — Ambulatory Visit: Payer: PRIVATE HEALTH INSURANCE | Admitting: Internal Medicine

## 2015-10-28 ENCOUNTER — Encounter (INDEPENDENT_AMBULATORY_CARE_PROVIDER_SITE_OTHER): Payer: Self-pay

## 2015-10-28 ENCOUNTER — Ambulatory Visit (INDEPENDENT_AMBULATORY_CARE_PROVIDER_SITE_OTHER): Payer: BLUE CROSS/BLUE SHIELD | Admitting: Internal Medicine

## 2015-10-28 ENCOUNTER — Encounter: Payer: Self-pay | Admitting: Internal Medicine

## 2015-10-28 VITALS — BP 108/70 | HR 67 | Ht 70.0 in | Wt 187.4 lb

## 2015-10-28 DIAGNOSIS — J309 Allergic rhinitis, unspecified: Secondary | ICD-10-CM

## 2015-10-28 DIAGNOSIS — H1013 Acute atopic conjunctivitis, bilateral: Secondary | ICD-10-CM | POA: Diagnosis not present

## 2015-10-28 DIAGNOSIS — J45909 Unspecified asthma, uncomplicated: Secondary | ICD-10-CM | POA: Diagnosis not present

## 2015-10-28 DIAGNOSIS — J4489 Other specified chronic obstructive pulmonary disease: Secondary | ICD-10-CM

## 2015-10-28 DIAGNOSIS — J449 Chronic obstructive pulmonary disease, unspecified: Secondary | ICD-10-CM

## 2015-10-28 MED ORDER — FLUTICASONE PROPIONATE 50 MCG/ACT NA SUSP: 1.0000 | NASAL | Status: DC | PRN

## 2015-10-28 MED ORDER — FLUTICASONE-SALMETEROL 100-50 MCG/DOSE IN AEPB: INHALATION_SPRAY | RESPIRATORY_TRACT | Status: DC

## 2015-10-28 MED ORDER — ALBUTEROL SULFATE HFA 108 (90 BASE) MCG/ACT IN AERS: 2.0000 | INHALATION_SPRAY | Freq: Four times a day (QID) | RESPIRATORY_TRACT | Status: DC | PRN

## 2015-10-28 NOTE — Assessment & Plan Note (Signed)
Mild exacerbation of conjunctivitis. OTC allergy eyedrops should be sufficient. Watch for dry eye as alternative diagnosis.

## 2015-10-28 NOTE — Progress Notes (Signed)
09/07/11- 55 yo M never smoker followed for  LOV- 09/05/2010 Another good year with few respiratory problems. Eyes watered this winter, probably from overdrying. He sees no reason to change what he is doing. Continues Advair once daily. Exercises regularly and not aware of shortness of breath. No need for rescue inhaler. No PFT found in electronic file.  08/24/13- 52 yoM never smoker followed for chronic obstructive asthma, allergic conjunctivitis, allergic rhinitis FOLLOWS ZOX:WRUEFOR:last seen 09-07-2011; eyes bother him slightly this time of year but normally clears up and goes away. Denies any flare ups of his breathing. Advair is getting more expensive. We're going to price check Symbicort. He feels well controlled. CXR 06/28/13 IMPRESSION:  No active cardiopulmonary disease.  Electronically Signed  By: Marlan Palauharles Clark M.D.  On: 06/28/2013 11:43 Office spirometry 08/24/2013-moderately severe obstruction. FVC 3.64/77%, FEV1 2.13/57%, FEV1/FVC 0.59, FEF 25-75% 1.23/33%.  10/09/14- 52 yoM never smoker followed for chronic obstructive asthma, allergic conjunctivitis, allergic rhinitis FOLLOWS UP: Pt denies any flare ups. Pt state breathing has stayed the same since last OV.  No acute respiratory illness this winter. He quit Singulair and notices no difference. Has not needed rescue inhaler in months but continues Advair. Spring rhinitis symptoms have been minimal so far.  10/28/2015-55 year old male never smoker followed for chronic obstructive asthma ( a1AT MM), allergic conjunctivitis, allergic rhinitis FOLLOWS FOR: Denies any current breathing issues or asthma flares.  He has avoided major problems over the past year although he did have a sustained bronchitis last winter. Only occasionally notice wheeze with exertion now. No routine cough or phlegm. Uses rescue inhaler less than once per week with no sleep disturbance. Uses Advair 1 puff once daily. Pollen allergy aggravating conjunctivae now only in Spring  peak season. Uses OTC eyedrops.  ROS-see HPI Constitutional:   No-   weight loss, night sweats, fevers, chills, fatigue, lassitude. HEENT:   No-  headaches, difficulty swallowing, tooth/dental problems, sore throat,       No-  sneezing, itching, ear ache, nasal congestion, post nasal drip,  CV:  No-   chest pain, orthopnea, PND, swelling in lower extremities, anasarca,  dizziness, palpitations Resp: +shortness of breath with exertion or at rest.              No-   productive cough,  No non-productive cough,  No- coughing up of blood.              No-   change in color of mucus.  + wheezing.   Skin: No-   rash or lesions. GI:  No-   heartburn, indigestion, abdominal pain, nausea, GU: . MS:  No-   joint pain or swelling.  Neuro-     nothing unusual Psych:  No- change in mood or affect. No depression or anxiety.  No memory loss.  OBJ- Physical Exam General- Alert, Oriented, Affect-appropriate, Distress- none acute, fit-appearing Skin- rash-none, lesions- none, excoriation- none Lymphadenopathy- none Head- atraumatic            Eyes- Gross vision intact, PERRLA, conjunctivae and secretions clear            Ears- Hearing, canals-normal            Nose- Clear, no-Septal dev, mucus, polyps, erosion, perforation.             Throat- Mallampati II , mucosa clear , drainage- none, tonsils- atrophic Neck- flexible , trachea midline, no stridor , thyroid nl, carotid no bruit Chest - symmetrical excursion , unlabored  Heart/CV- RRR , no murmur , no gallop  , no rub, nl s1 s2                           - JVD- none , edema- none, stasis changes- none, varices- none           Lung- clear to P&A/ diminished, wheeze- none, cough- none , dullness-none, rub- none           Chest wall-  Abd-  Br/ Gen/ Rectal- Not done, not indicated Extrem- cyanosis- none, clubbing, none, atrophy- none, strength- nl Neuro- grossly intact to observation

## 2015-10-28 NOTE — Assessment & Plan Note (Signed)
Chronic fixed asthma overlap. Not clear that cortisone component in his Advair is helpful. For comparison we are going to let him try sample Anoro with discussion.

## 2015-10-28 NOTE — Patient Instructions (Signed)
Sample Anoro Ellipta maintenance inhaler to compare with Advair    Inhale 1 puff, once daily   If you like it, ask your pharmacy how the price would compare with Advair for your insurance and call us if you would like a script.

## 2015-11-06 ENCOUNTER — Ambulatory Visit (INDEPENDENT_AMBULATORY_CARE_PROVIDER_SITE_OTHER): Payer: BLUE CROSS/BLUE SHIELD | Admitting: Family Medicine

## 2015-11-06 ENCOUNTER — Encounter: Payer: Self-pay | Admitting: Family Medicine

## 2015-11-06 VITALS — BP 143/86 | HR 67 | Temp 97.4°F | Ht 70.0 in | Wt 182.0 lb

## 2015-11-06 DIAGNOSIS — J40 Bronchitis, not specified as acute or chronic: Secondary | ICD-10-CM

## 2015-11-06 DIAGNOSIS — J329 Chronic sinusitis, unspecified: Secondary | ICD-10-CM | POA: Diagnosis not present

## 2015-11-06 MED ORDER — PSEUDOEPHEDRINE-GUAIFENESIN ER 120-1200 MG PO TB12: 1.0000 | ORAL_TABLET | Freq: Two times a day (BID) | ORAL | Status: DC

## 2015-11-06 MED ORDER — BETAMETHASONE SOD PHOS & ACET 6 (3-3) MG/ML IJ SUSP
6.0000 mg | Freq: Once | INTRAMUSCULAR | Status: AC
  Administered 2015-11-06: 6 mg via INTRAMUSCULAR

## 2015-11-06 MED ORDER — AMOXICILLIN-POT CLAVULANATE 500-125 MG PO TABS: 1.0000 | ORAL_TABLET | Freq: Two times a day (BID) | ORAL | Status: AC

## 2015-11-06 NOTE — Progress Notes (Signed)
Subjective:  Patient ID: Justin Pennington, male    DOB: 06/19/1961  Age: 55 y.o. MRN: [id number]  CC: URI; Cough; and Hoarse   HPI Justin Pennington presents for Patient presents with upper respiratory congestion. Rhinorrhea that is frequently purulent. There is moderate sore throat. Patient reports coughing frequently as well.with copious purulent sputum noted. There is no fever no chills no sweats. The patient denies being short of breath. Onset was 3-5 days ago. Gradually worsening in spite of home remedies. History of asthma. Not dyspneic currently.    History Justin Pennington has a past medical history of Chronic obstructive asthma, unspecified; Allergic conjunctivitis; Insomnia, unspecified; H/O irritable bowel syndrome; and Hyperlipidemia.   Justin Pennington has past surgical history that includes GINGIVAL GRAFT and LEFT MENISCUS TEAR -ARTHROSCOPIC REPAIR.   His family history includes Asthma in his brother; Cancer in his father; Colon cancer (age of onset: 47) in his maternal aunt; Diabetes in his mother. There is no history of Stomach cancer.Justin Pennington reports that Justin Pennington has never smoked. Justin Pennington has never used smokeless tobacco. Justin Pennington reports that Justin Pennington drinks alcohol. Justin Pennington reports that Justin Pennington does not use illicit drugs.    ROS Review of Systems  Constitutional: Negative for fever, chills, activity change and appetite change.  HENT: Positive for congestion, postnasal drip, rhinorrhea and sinus pressure. Negative for ear discharge, ear pain, hearing loss, nosebleeds, sneezing and trouble swallowing.   Respiratory: Negative for chest tightness and shortness of breath.   Cardiovascular: Negative for chest pain and palpitations.  Skin: Negative for rash.    Objective:  BP 143/86 mmHg  Pulse 67  Temp(Src) 97.4 F (36.3 C) (Oral)  Ht  (1.778 m)  Wt 182 lb (82.555 kg)  BMI 26.11 kg/m2  BP Readings from Last 3 Encounters:  11/06/15 143/86  10/28/15 108/70  07/16/15 149/85    Wt Readings from Last 3 Encounters:  11/06/15 182  lb (82.555 kg)  10/28/15 187 lb 6.4 oz (85.004 kg)  07/16/15 185 lb (83.915 kg)     Physical Exam  Constitutional: Justin Pennington appears well-developed and well-nourished.  HENT:  Head: Normocephalic and atraumatic.  Right Ear: Tympanic membrane and external ear normal. No decreased hearing is noted.  Left Ear: Tympanic membrane and external ear normal. No decreased hearing is noted.  Nose: Mucosal edema present. Right sinus exhibits no frontal sinus tenderness. Left sinus exhibits no frontal sinus tenderness.  Mouth/Throat: No oropharyngeal exudate or posterior oropharyngeal erythema.  Neck: No Brudzinski's sign noted.  Pulmonary/Chest: Breath sounds normal. No respiratory distress.  Lymphadenopathy:       Head (right side): No preauricular adenopathy present.       Head (left side): No preauricular adenopathy present.       Right cervical: No superficial cervical adenopathy present.      Left cervical: No superficial cervical adenopathy present.     Lab Results  Component Value Date   WBC 7.8 05/17/2014   HGB 14.9 05/17/2014   HCT 44.2 05/17/2014   GLUCOSE 92 06/28/2013   CHOL 179 06/28/2013   TRIG 138 06/28/2013   HDL 47 06/28/2013   LDLCALC 104* 06/28/2013   ALT 48* 06/28/2013   AST 26 06/28/2013   NA 140 06/28/2013   K 4.5 06/28/2013   CL 100 06/28/2013   CREATININE 1.12 06/28/2013   BUN 12 06/28/2013   CO2 22 06/28/2013   PSA 0.8 06/28/2013    Dg Chest 2 View  09/05/2010  *RADIOLOGY REPORT* Clinical Data: COPD, asthma. CHEST - 2 VIEW  Comparison: None Findings: Linear densities are seen in the lingula, likely scarring.  Right lung is clear.  Heart is normal size.  No effusions or acute bony abnormality. IMPRESSION: Lingular scarring.  No active disease. Original Report Authenticated By: Cyndie ChimeKEVIN G. DOVER, M.D.   Assessment & Plan:   Justin Pennington was seen today for uri, cough and hoarse.  Diagnoses and all orders for this visit:  Sinobronchitis -     betamethasone  acetate-betamethasone sodium phosphate (CELESTONE) injection 6 mg; Inject 1 mL (6 mg total) into the muscle once.  Other orders -     amoxicillin-clavulanate (AUGMENTIN) 500-125 MG tablet; Take 1 tablet (500 mg total) by mouth 2 (two) times daily. Take with food. Take all of the pills -     Pseudoephedrine-Guaifenesin [phone] MG TB12; Take 1 tablet by mouth 2 (two) times daily. For congestion     I am having Justin Pennington start on amoxicillin-clavulanate and Pseudoephedrine-Guaifenesin. I am also having him maintain his Fluticasone-Salmeterol, albuterol, and fluticasone. We will continue to administer betamethasone acetate-betamethasone sodium phosphate.  Meds ordered this encounter  Medications  . amoxicillin-clavulanate (AUGMENTIN) 500-125 MG tablet    Sig: Take 1 tablet (500 mg total) by mouth 2 (two) times daily. Take with food. Take all of the pills    Dispense:  20 tablet    Refill:  0  . betamethasone acetate-betamethasone sodium phosphate (CELESTONE) injection 6 mg    Sig:   . Pseudoephedrine-Guaifenesin [phone] MG TB12    Sig: Take 1 tablet by mouth 2 (two) times daily. For congestion    Dispense:  20 each    Refill:  0     Follow-up: Return if symptoms worsen or fail to improve.  Mechele ClaudeWarren Arrian Manson, M.D.

## 2015-11-06 NOTE — Patient Instructions (Signed)
It was a pleasure to meet you this evening! Thank you for coming in.  Today's diagnosis is sinobronchitis. An antibiotic and decongestant has been sent to the pharmacy. You were given a Celestone shot. This is a form of cortisone that reduces inflammation in your respiratory and sinus tracts.  Don't forget to take a probiotic regularly and take her antibiotic with food to reduce the risk of nausea and diarrhea.  Best regards,  Fluor CorporationWarren Dreden Rivere.

## 2016-06-12 ENCOUNTER — Other Ambulatory Visit: Payer: Self-pay | Admitting: Internal Medicine

## 2016-06-17 ENCOUNTER — Telehealth: Payer: Self-pay | Admitting: Internal Medicine

## 2016-06-17 MED ORDER — FLUTICASONE-SALMETEROL 100-50 MCG/DOSE IN AEPB: INHALATION_SPRAY | RESPIRATORY_TRACT | 3 refills | Status: DC

## 2016-06-17 NOTE — Telephone Encounter (Signed)
Called pt to verify pharmacy, sent in Rx to last until April. Reminded pt to follow up.

## 2016-08-26 ENCOUNTER — Other Ambulatory Visit: Payer: Self-pay | Admitting: Internal Medicine

## 2016-10-27 ENCOUNTER — Encounter: Payer: Self-pay | Admitting: Internal Medicine

## 2016-10-27 ENCOUNTER — Ambulatory Visit (INDEPENDENT_AMBULATORY_CARE_PROVIDER_SITE_OTHER): Payer: PRIVATE HEALTH INSURANCE | Admitting: Internal Medicine

## 2016-10-27 DIAGNOSIS — J449 Chronic obstructive pulmonary disease, unspecified: Secondary | ICD-10-CM

## 2016-10-27 DIAGNOSIS — J4489 Other specified chronic obstructive pulmonary disease: Secondary | ICD-10-CM

## 2016-10-27 DIAGNOSIS — J302 Other seasonal allergic rhinitis: Secondary | ICD-10-CM

## 2016-10-27 DIAGNOSIS — J3089 Other allergic rhinitis: Secondary | ICD-10-CM

## 2016-10-27 LAB — NITRIC OXIDE: Nitric Oxide: 34

## 2016-10-27 MED ORDER — AZELASTINE-FLUTICASONE 137-50 MCG/ACT NA SUSP: 2.0000 | Freq: Every day | NASAL | 0 refills | Status: DC | PRN

## 2016-10-27 NOTE — Assessment & Plan Note (Signed)
Chronic obstructive asthma has still seem to be the right diagnosis although we have no history of active reactive airways. He is needing uses rescue inhaler more now. Plan-schedule PFT.

## 2016-10-27 NOTE — Assessment & Plan Note (Signed)
We discussed OTC antihistamines. Also discussed associated eye symptoms and distinction between dry eyes from excessive antihistamines, and allergic conjunctivitis. Plan-sample Dymista for trial

## 2016-10-27 NOTE — Progress Notes (Signed)
HPI male never smoker followed for chronic obstructive asthma ( a1AT MM), allergic conjunctivitis, allergic rhinitis Office spirometry 08/24/2013-moderately severe obstruction. FVC 3.64/77%, FEV1 2.13/57%, FEV1/FVC 0.59, FEF 25-75% 1.23/33% FENO 10/27/16-  34 H -----------------------------------------------------------------------------------------------------  10/09/14- 52 yoM never smoker followed for chronic obstructive asthma, allergic conjunctivitis, allergic rhinitis FOLLOWS UP: Pt denies any flare ups. Pt state breathing has stayed the same since last OV.  No acute respiratory illness this winter. He quit Singulair and notices no difference. Has not needed rescue inhaler in months but continues Advair. Spring rhinitis symptoms have been minimal so far.  10/28/2015-56 year old male never smoker followed for chronic obstructive asthma ( a1AT MM), allergic conjunctivitis, allergic rhinitis FOLLOWS FOR: Denies any current breathing issues or asthma flares.  He has avoided major problems over the past year although he did have a sustained bronchitis last winter. Only occasionally notice wheeze with exertion now. No routine cough or phlegm. Uses rescue inhaler less than once per week with no sleep disturbance. Uses Advair 1 puff once daily. Pollen allergy aggravating conjunctivae now only in Spring peak season. Uses OTC eyedrops.  10/27/16- 56 year old male never smoker followed for chronic obstructive asthma ( a1AT MM), allergic conjunctivitis, allergic rhinitis FOLLOWS FOR: Pt denies any issues with breathing at this time. Pt continues Advair QD and rescue inhaler as needed.  Rare wheeze except now in pollen season. He is now using his rescue inhaler once or twice daily but most of the year he doesn't use it at all. Little nasal congestion or drainage but still has moderate problems with burning and watering of eyes treated OTC. FENO 10/27/16-  34 H  ROS-see HPI Constitutional:   No-   weight  loss, night sweats, fevers, chills, fatigue, lassitude. HEENT:   No-  headaches, difficulty swallowing, tooth/dental problems, sore throat,       No-  sneezing, itching, ear ache, nasal congestion, post nasal drip,  CV:  No-   chest pain, orthopnea, PND, swelling in lower extremities, anasarca,  dizziness, palpitations Resp: +shortness of breath with exertion or at rest.              No-   productive cough,  No non-productive cough,  No- coughing up of blood.              No-   change in color of mucus.  + wheezing.   Skin: No-   rash or lesions. GI:  No-   heartburn, indigestion, abdominal pain, nausea, GU: . MS:  No-   joint pain or swelling.  Neuro-     nothing unusual Psych:  No- change in mood or affect. No depression or anxiety.  No memory loss.  OBJ- Physical Exam General- Alert, Oriented, Affect-appropriate, Distress- none acute, fit-appearing Skin- rash-none, lesions- none, excoriation- none Lymphadenopathy- none Head- atraumatic            Eyes- Gross vision intact, PERRLA, conjunctivae and secretions clear            Ears- Hearing, canals-normal            Nose- Clear, no-Septal dev, mucus, polyps, erosion, perforation.             Throat- Mallampati II , mucosa clear , drainage- none, tonsils- atrophic Neck- flexible , trachea midline, no stridor , thyroid nl, carotid no bruit Chest - symmetrical excursion , unlabored           Heart/CV- RRR , no murmur , no gallop  , no rub, nl  s1 s2                           - JVD- none , edema- none, stasis changes- none, varices- none           Lung- clear to P&A/ diminished, wheeze- none, cough- none , dullness-none, rub- none           Chest wall-  Abd-  Br/ Gen/ Rectal- Not done, not indicated Extrem- cyanosis- none, clubbing, none, atrophy- none, strength- nl Neuro- grossly intact to observation

## 2016-10-27 NOTE — Patient Instructions (Addendum)
Order- FENO    Dx chronic obstructive asthma  Order- schedule PFT  Sample Dymista nasal spray      Try 1-2 puffs each nostril once daily as needed for allergy symptoms  Ok to use oral antihistamine like claritin, allegra, zyrtec   And otc allergy or lubricant eye drops as needed  Please call as needed

## 2016-11-16 ENCOUNTER — Telehealth: Payer: Self-pay | Admitting: Internal Medicine

## 2016-11-16 ENCOUNTER — Other Ambulatory Visit: Payer: Self-pay | Admitting: Internal Medicine

## 2016-11-16 NOTE — Telephone Encounter (Signed)
Called and spoke to pt. Pt is requesting Proair refill. Informed pt that his rx has already been called in earlier today. Pt verbalized understanding and denied any further questions or concerns at this time.

## 2017-01-13 ENCOUNTER — Telehealth: Payer: Self-pay | Admitting: Internal Medicine

## 2017-01-13 MED ORDER — FLUTICASONE-SALMETEROL 100-50 MCG/DOSE IN AEPB: INHALATION_SPRAY | RESPIRATORY_TRACT | 3 refills | Status: DC

## 2017-01-13 NOTE — Telephone Encounter (Signed)
ATC but was hold. RX has been sent into pharmacy. Will close this message.

## 2017-02-08 ENCOUNTER — Ambulatory Visit (INDEPENDENT_AMBULATORY_CARE_PROVIDER_SITE_OTHER): Payer: BLUE CROSS/BLUE SHIELD | Admitting: Family

## 2017-02-08 ENCOUNTER — Encounter: Payer: Self-pay | Admitting: Family

## 2017-02-08 VITALS — BP 139/76 | HR 79 | Temp 97.2°F | Ht 70.0 in | Wt 180.0 lb

## 2017-02-08 DIAGNOSIS — R42 Dizziness and giddiness: Secondary | ICD-10-CM

## 2017-02-08 DIAGNOSIS — J301 Allergic rhinitis due to pollen: Secondary | ICD-10-CM

## 2017-02-08 MED ORDER — CETIRIZINE HCL 10 MG PO TABS: 10.0000 mg | ORAL_TABLET | Freq: Every day | ORAL | 11 refills | Status: AC

## 2017-02-08 MED ORDER — FLUTICASONE PROPIONATE 50 MCG/ACT NA SUSP: 2.0000 | Freq: Every day | NASAL | 6 refills | Status: DC

## 2017-02-08 NOTE — Patient Instructions (Signed)
Allergic Rhinitis Allergic rhinitis is when the mucous membranes in the nose respond to allergens. Allergens are particles in the air that cause your body to have an allergic reaction. This causes you to release allergic antibodies. Through a chain of events, these eventually cause you to release histamine into the blood stream. Although meant to protect the body, it is this release of histamine that causes your discomfort, such as frequent sneezing, congestion, and an itchy, runny nose. What are the causes? Seasonal allergic rhinitis (hay fever) is caused by pollen allergens that may come from grasses, trees, and weeds. Year-round allergic rhinitis (perennial allergic rhinitis) is caused by allergens such as house dust mites, pet dander, and mold spores. What are the signs or symptoms?  Nasal stuffiness (congestion).  Itchy, runny nose with sneezing and tearing of the eyes. How is this diagnosed? Your health care provider can help you determine the allergen or allergens that trigger your symptoms. If you and your health care provider are unable to determine the allergen, skin or blood testing may be used. Your health care provider will diagnose your condition after taking your health history and performing a physical exam. Your health care provider may assess you for other related conditions, such as asthma, pink eye, or an ear infection. How is this treated? Allergic rhinitis does not have a cure, but it can be controlled by:  Medicines that block allergy symptoms. These may include allergy shots, nasal sprays, and oral antihistamines.  Avoiding the allergen. Hay fever may often be treated with antihistamines in pill or nasal spray forms. Antihistamines block the effects of histamine. There are over-the-counter medicines that may help with nasal congestion and swelling around the eyes. Check with your health care provider before taking or giving this medicine. If avoiding the allergen or the  medicine prescribed do not work, there are many new medicines your health care provider can prescribe. Stronger medicine may be used if initial measures are ineffective. Desensitizing injections can be used if medicine and avoidance does not work. Desensitization is when a patient is given ongoing shots until the body becomes less sensitive to the allergen. Make sure you follow up with your health care provider if problems continue. Follow these instructions at home: It is not possible to completely avoid allergens, but you can reduce your symptoms by taking steps to limit your exposure to them. It helps to know exactly what you are allergic to so that you can avoid your specific triggers. Contact a health care provider if:  You have a fever.  You develop a cough that does not stop easily (persistent).  You have shortness of breath.  You start wheezing.  Symptoms interfere with normal daily activities. This information is not intended to replace advice given to you by your health care provider. Make sure you discuss any questions you have with your health care provider. Document Released: 03/17/2001 Document Revised: 02/21/2016 Document Reviewed: 02/27/2013 Elsevier Interactive Patient Education  2017 Elsevier Inc.  

## 2017-02-08 NOTE — Progress Notes (Signed)
   Subjective:    Patient ID: Justin MoloneyDavid Pennington, male    DOB: 11-23-1960, 56 y.o.   MRN: [id number]  Sinus Problem  This is a new problem. The current episode started in the past 7 days. The problem is unchanged. There has been no fever. His pain is at a severity of 2/10. The pain is mild. Associated symptoms include headaches, a hoarse voice and sinus pressure. Pertinent negatives include no congestion, coughing, ear pain, sneezing or sore throat. (Dizziness) Past treatments include oral decongestants, lying down and acetaminophen. The treatment provided mild relief.      Review of Systems  HENT: Positive for hoarse voice and sinus pressure. Negative for congestion, ear pain, sneezing and sore throat.   Respiratory: Negative for cough.   Neurological: Positive for headaches.  All other systems reviewed and are negative.      Objective:   Physical Exam  Constitutional: He is oriented to person, place, and time. He appears well-developed and well-nourished. No distress.  HENT:  Head: Normocephalic.  Right Ear: External ear normal.  Left Ear: External ear normal.  Nose: Mucosal edema and rhinorrhea present.  Mouth/Throat: Posterior oropharyngeal erythema present.  Eyes: Pupils are equal, round, and reactive to light. Right eye exhibits no discharge. Left eye exhibits no discharge.  Neck: Normal range of motion. Neck supple. No thyromegaly present.  Cardiovascular: Normal rate, regular rhythm, normal heart sounds and intact distal pulses.   No murmur heard. Pulmonary/Chest: Effort normal and breath sounds normal. No respiratory distress. He has no wheezes.  Abdominal: Soft. Bowel sounds are normal. He exhibits no distension. There is no tenderness.  Musculoskeletal: Normal range of motion. He exhibits no edema or tenderness.  Neurological: He is alert and oriented to person, place, and time.  Skin: Skin is warm and dry. No rash noted. No erythema.  Psychiatric: He has a normal mood and  affect. His behavior is normal. Judgment and thought content normal.  Vitals reviewed.     BP 139/76   Pulse 79   Temp (!) 97.2 F (36.2 C) (Oral)   Ht 5\' 10"  (1.778 m)   Wt 180 lb (81.6 kg)   BMI 25.83 kg/m      Assessment & Plan:  1. Allergic rhinitis due to pollen, unspecified seasonality - fluticasone (FLONASE) 50 MCG/ACT nasal spray; Place 2 sprays into both nostrils daily.  Dispense: 16 g; Refill: 6 - cetirizine (ZYRTEC) 10 MG tablet; Take 1 tablet (10 mg total) by mouth daily.  Dispense: 30 tablet; Refill: 11  2. Vertigo - fluticasone (FLONASE) 50 MCG/ACT nasal spray; Place 2 sprays into both nostrils daily.  Dispense: 16 g; Refill: 6 - cetirizine (ZYRTEC) 10 MG tablet; Take 1 tablet (10 mg total) by mouth daily.  Dispense: 30 tablet; Refill: 11  Start zyrtec and flonase daily So position changes Call if symptoms worsen or do not improve Force fluids RTO prn   Jannifer Rodneyhristy Tyshia Fenter, FNP

## 2017-02-24 ENCOUNTER — Ambulatory Visit (INDEPENDENT_AMBULATORY_CARE_PROVIDER_SITE_OTHER): Payer: BLUE CROSS/BLUE SHIELD | Admitting: Family Medicine

## 2017-02-24 ENCOUNTER — Encounter: Payer: Self-pay | Admitting: Family Medicine

## 2017-02-24 VITALS — BP 127/70 | HR 72 | Temp 98.2°F | Ht 70.0 in | Wt 182.0 lb

## 2017-02-24 DIAGNOSIS — R0981 Nasal congestion: Secondary | ICD-10-CM

## 2017-02-24 DIAGNOSIS — J0120 Acute ethmoidal sinusitis, unspecified: Secondary | ICD-10-CM

## 2017-02-24 MED ORDER — PSEUDOEPHEDRINE-GUAIFENESIN ER 120-1200 MG PO TB12: 1.0000 | ORAL_TABLET | Freq: Two times a day (BID) | ORAL | 0 refills | Status: DC

## 2017-02-24 MED ORDER — BETAMETHASONE SOD PHOS & ACET 6 (3-3) MG/ML IJ SUSP
6.0000 mg | Freq: Once | INTRAMUSCULAR | Status: AC
  Administered 2017-02-24: 6 mg via INTRAMUSCULAR

## 2017-02-24 MED ORDER — AMOXICILLIN-POT CLAVULANATE 875-125 MG PO TABS: 1.0000 | ORAL_TABLET | Freq: Two times a day (BID) | ORAL | 0 refills | Status: DC

## 2017-02-24 NOTE — Progress Notes (Signed)
Chief Complaint  Patient presents with  . Sinus Problem    pt here today c/o sinus pressure, congestion and "swimmy headed" when turning his head to fast.    HPI  Patient presents today for Symptoms include congestion, facial pain, nasal congestion, non productive cough,Minimal post nasal drip and sinus pressure. There is no fever, chills, or sweats. Onset of symptoms was three weeks ago, gradually worsening since that time. When he turns his head rapidly he has to give himself a moment or 2 to regain his balance. Feels a bit swimmy headed.   PMH: Smoking status noted ROS: Per HPI  Objective: BP 127/70   Pulse 72   Temp 98.2 F (36.8 C) (Oral)   Ht 5\' 10"  (1.778 m)   Wt 182 lb (82.6 kg)   BMI 26.11 kg/m  Gen: NAD, alert, cooperative with exam HEENT: NCAT, EOMI, PERRLNasal passages boggy TMs clear CV: RRR, good S1/S2, no murmur Resp: CTABL, no wheezes, non-labored Abd: SNTND, BS present, no guarding or organomegaly Ext: No edema, warm Neuro: Alert and oriented, No gross deficits  Assessment and plan:  1. Nasal sinus congestion   2. Acute ethmoidal sinusitis, recurrence not specified     Meds ordered this encounter  Medications  . amoxicillin-clavulanate (AUGMENTIN) 875-125 MG tablet    Sig: Take 1 tablet by mouth 2 (two) times daily.    Dispense:  20 tablet    Refill:  0  . Pseudoephedrine-Guaifenesin (MUCINEX D MAX STRENGTH) [phone] MG TB12    Sig: Take 1 capsule by mouth 2 (two) times daily.    Dispense:  20 each    Refill:  0  . betamethasone acetate-betamethasone sodium phosphate (CELESTONE) injection 6 mg    No orders of the defined types were placed in this encounter.   Follow up as needed.  Mechele Claude, MD

## 2017-04-29 DIAGNOSIS — Z23 Encounter for immunization: Secondary | ICD-10-CM | POA: Diagnosis not present

## 2017-07-19 ENCOUNTER — Other Ambulatory Visit: Payer: Self-pay | Admitting: Internal Medicine

## 2017-08-20 ENCOUNTER — Telehealth: Payer: Self-pay | Admitting: Internal Medicine

## 2017-08-20 MED ORDER — FLUTICASONE-SALMETEROL 100-50 MCG/DOSE IN AEPB: INHALATION_SPRAY | RESPIRATORY_TRACT | 3 refills | Status: DC

## 2017-08-20 NOTE — Telephone Encounter (Signed)
Spoke with patient. He was requesting a refill on his Advair [facility]mcg [facility] [place]. Verified dosage. Advised patient that I would sent this in for him.   Nothing else needed at time of call.

## 2017-10-27 ENCOUNTER — Telehealth: Payer: Self-pay | Admitting: Internal Medicine

## 2017-10-27 ENCOUNTER — Ambulatory Visit: Payer: BLUE CROSS/BLUE SHIELD | Admitting: Internal Medicine

## 2017-10-27 MED ORDER — ALBUTEROL SULFATE HFA 108 (90 BASE) MCG/ACT IN AERS: 2.0000 | INHALATION_SPRAY | Freq: Four times a day (QID) | RESPIRATORY_TRACT | 0 refills | Status: DC | PRN

## 2017-10-27 NOTE — Telephone Encounter (Signed)
Spoke with patient. He was requesting a refill on his proair [facility] [place]. Advised patient that I would send this in for him.   Nothing else needed at time of call.

## 2017-10-28 ENCOUNTER — Other Ambulatory Visit: Payer: Self-pay | Admitting: *Deleted

## 2017-10-28 MED ORDER — ALBUTEROL SULFATE HFA 108 (90 BASE) MCG/ACT IN AERS: 2.0000 | INHALATION_SPRAY | Freq: Four times a day (QID) | RESPIRATORY_TRACT | 0 refills | Status: DC | PRN

## 2017-12-15 ENCOUNTER — Telehealth: Payer: Self-pay | Admitting: Internal Medicine

## 2017-12-15 NOTE — Telephone Encounter (Signed)
Serita/[employer], CB [phone] is calling stating patient has paid claim for Advair and calling to see if can [facility].

## 2017-12-15 NOTE — Telephone Encounter (Signed)
LMTCB for [employer]

## 2017-12-15 NOTE — Telephone Encounter (Signed)
Received a fax from [facility] that pt's insurance is not covering the generic form of Advair 100/50. PA has been initiated on Cover My Meds. Key: [id number]: [id number]. Will route [facility] to [facility] for [facility] up.

## 2017-12-16 NOTE — Telephone Encounter (Signed)
If Insurance is willing to pay for Advair then we can keep that Rx and cancel the Wixela. Thanks.

## 2017-12-16 NOTE — Telephone Encounter (Signed)
Called and spoke to [place] with [employer]. Clelia SchaumannSerita stated that Advair 100 is covered by insurance.  I have gave her verbal to cancel Wixela.  Called and spoke to pt. Pt stated that he did pick Rx for advair up on Tuesday.  Nothing further is needed.

## 2018-01-07 ENCOUNTER — Ambulatory Visit: Payer: BLUE CROSS/BLUE SHIELD | Admitting: Internal Medicine

## 2018-01-13 ENCOUNTER — Encounter: Payer: Self-pay | Admitting: Internal Medicine

## 2018-01-13 ENCOUNTER — Ambulatory Visit: Payer: BLUE CROSS/BLUE SHIELD | Admitting: Internal Medicine

## 2018-01-13 VITALS — BP 120/76 | HR 64 | Ht 70.0 in | Wt 181.4 lb

## 2018-01-13 DIAGNOSIS — J3089 Other allergic rhinitis: Secondary | ICD-10-CM

## 2018-01-13 DIAGNOSIS — J302 Other seasonal allergic rhinitis: Secondary | ICD-10-CM

## 2018-01-13 DIAGNOSIS — J4489 Other specified chronic obstructive pulmonary disease: Secondary | ICD-10-CM

## 2018-01-13 DIAGNOSIS — J449 Chronic obstructive pulmonary disease, unspecified: Secondary | ICD-10-CM | POA: Diagnosis not present

## 2018-01-13 MED ORDER — FLUTICASONE-SALMETEROL 100-50 MCG/DOSE IN AEPB: INHALATION_SPRAY | RESPIRATORY_TRACT | 3 refills | Status: DC

## 2018-01-13 MED ORDER — ALBUTEROL SULFATE HFA 108 (90 BASE) MCG/ACT IN AERS: 2.0000 | INHALATION_SPRAY | Freq: Four times a day (QID) | RESPIRATORY_TRACT | 3 refills | Status: DC | PRN

## 2018-01-13 NOTE — Progress Notes (Signed)
HPI male never smoker followed for chronic obstructive asthma ( a1AT MM), allergic conjunctivitis, allergic rhinitis Office spirometry 08/24/2013-moderately severe obstruction. FVC 3.64/77%, FEV1 2.13/57%, FEV1/FVC 0.59, FEF 25-75% 1.23/33% FENO 10/27/16-  34 H ---------------------------------------------------------------------------------------------------- 10/27/16- 57 year old male never smoker followed for chronic obstructive asthma ( a1AT MM), allergic conjunctivitis, allergic rhinitis FOLLOWS FOR: Pt denies any issues with breathing at this time. Pt continues Advair QD and rescue inhaler as needed.  Rare wheeze except now in pollen season. He is now using his rescue inhaler once or twice daily but most of the year he doesn't use it at all. Little nasal congestion or drainage but still has moderate problems with burning and watering of eyes treated OTC. FENO 10/27/16-  34 H  01/13/2018- 57 year old male never smoker followed for chronic obstructive asthma ( a1AT MM), allergic conjunctivitis, allergic rhinitis ----Asthma: Pt denies any trouble with breathing overall.  Advair 100, pro-air HFA, Flonase Frequent throat clearing, occasional heartburn.  Does not wake at night strangled or choking. Occasional sinus pressure.  Little sneezing or blowing.  Flonase works when needed. Not much affected by his breathing-does not slow him down.  Uses rescue inhaler once or twice a week.  Gives history of pneumonia at age 57 or 3617 months which may explain his chronic obstructive airways disease.  ROS-see HPI    + = positive Constitutional:   No-   weight loss, night sweats, fevers, chills, fatigue, lassitude. HEENT:   No-  headaches, difficulty swallowing, tooth/dental problems, sore throat,       No-  sneezing, itching, ear ache, nasal congestion, post nasal drip,  CV:  No-   chest pain, orthopnea, PND, swelling in lower extremities, anasarca,  dizziness, palpitations Resp: +shortness of breath with  exertion or at rest.              No-   productive cough,  No non-productive cough,  No- coughing up of blood.              No-   change in color of mucus.  + wheezing.   Skin: No-   rash or lesions. GI:  No-   heartburn, indigestion, abdominal pain, nausea, GU: . MS:  No-   joint pain or swelling.  Neuro-     nothing unusual Psych:  No- change in mood or affect. No depression or anxiety.  No memory loss.  OBJ- Physical Exam General- Alert, Oriented, Affect-appropriate, Distress- none acute, fit-appearing Skin- rash-none, lesions- none, excoriation- none Lymphadenopathy- none Head- atraumatic            Eyes- Gross vision intact, PERRLA, conjunctivae and secretions clear            Ears- Hearing, canals-normal            Nose- Clear, no-Septal dev, mucus, polyps, erosion, perforation.             Throat- Mallampati II , mucosa clear , drainage- none, tonsils- atrophic Neck- flexible , trachea midline, no stridor , thyroid nl, carotid no bruit Chest - symmetrical excursion , unlabored           Heart/CV- RRR , no murmur , no gallop  , no rub, nl s1 s2                           - JVD- none , edema- none, stasis changes- none, varices- none           Lung- clear  to P&A/ diminished, wheeze- none, cough- none , dullness-none, rub- none           Chest wall-  Abd-  Br/ Gen/ Rectal- Not done, not indicated Extrem- cyanosis- none, clubbing, none, atrophy- none, strength- nl Neuro- grossly intact to observation

## 2018-01-13 NOTE — Patient Instructions (Signed)
Refill scripts sent to your [facility] mail order   Order schedule PFT     Dx COPD mixed type

## 2018-02-21 NOTE — Assessment & Plan Note (Signed)
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  Clinically stable. Plan-schedule formal PFT.  Refill current meds.

## 2018-02-21 NOTE — Assessment & Plan Note (Signed)
Not having significant rhinitis in mid summer.  Uses Flonase if needed.

## 2018-03-18 ENCOUNTER — Telehealth: Payer: Self-pay | Admitting: Family

## 2018-03-18 NOTE — Telephone Encounter (Signed)
appt made

## 2018-03-23 ENCOUNTER — Encounter: Payer: Self-pay | Admitting: Family Medicine

## 2018-03-23 ENCOUNTER — Ambulatory Visit (INDEPENDENT_AMBULATORY_CARE_PROVIDER_SITE_OTHER): Payer: BLUE CROSS/BLUE SHIELD | Admitting: Family Medicine

## 2018-03-23 ENCOUNTER — Ambulatory Visit (INDEPENDENT_AMBULATORY_CARE_PROVIDER_SITE_OTHER): Payer: BLUE CROSS/BLUE SHIELD

## 2018-03-23 ENCOUNTER — Other Ambulatory Visit: Payer: Self-pay | Admitting: Internal Medicine

## 2018-03-23 VITALS — BP 134/71 | HR 62 | Temp 97.3°F | Ht 70.0 in | Wt 180.0 lb

## 2018-03-23 DIAGNOSIS — E785 Hyperlipidemia, unspecified: Secondary | ICD-10-CM | POA: Diagnosis not present

## 2018-03-23 DIAGNOSIS — Z0000 Encounter for general adult medical examination without abnormal findings: Secondary | ICD-10-CM

## 2018-03-23 DIAGNOSIS — J301 Allergic rhinitis due to pollen: Secondary | ICD-10-CM

## 2018-03-23 DIAGNOSIS — H6123 Impacted cerumen, bilateral: Secondary | ICD-10-CM

## 2018-03-23 DIAGNOSIS — K644 Residual hemorrhoidal skin tags: Secondary | ICD-10-CM

## 2018-03-23 DIAGNOSIS — J4520 Mild intermittent asthma, uncomplicated: Secondary | ICD-10-CM | POA: Diagnosis not present

## 2018-03-23 DIAGNOSIS — N400 Enlarged prostate without lower urinary tract symptoms: Secondary | ICD-10-CM

## 2018-03-23 DIAGNOSIS — Z8052 Family history of malignant neoplasm of bladder: Secondary | ICD-10-CM

## 2018-03-23 LAB — URINALYSIS, COMPLETE
Bilirubin, UA: NEGATIVE
Glucose, UA: NEGATIVE
Ketones, UA: NEGATIVE
Leukocytes, UA: NEGATIVE
Nitrite, UA: NEGATIVE
Protein, UA: NEGATIVE
RBC, UA: NEGATIVE
Specific Gravity, UA: >1.03 — ABNORMAL HIGH (ref 1.005–1.030)
Urobilinogen, Ur: 0.2 mg/dL (ref 0.2–1.0)
pH, UA: 5 (ref 5.0–7.5)

## 2018-03-23 LAB — MICROSCOPIC EXAMINATION
Bacteria, UA: NONE SEEN
Epithelial Cells (non renal): NONE SEEN /hpf (ref 0–10)
RBC, UA: NONE SEEN /hpf (ref 0–2)
Renal Epithel, UA: NONE SEEN /hpf
WBC, UA: NONE SEEN /hpf (ref 0–5)

## 2018-03-23 NOTE — Addendum Note (Signed)
Addended by: Magdalene RiverBULLINS, Gaia Gullikson H on: 03/23/2018 12:05 PM   Modules accepted: Orders

## 2018-03-23 NOTE — Patient Instructions (Addendum)
Continue current medications. Continue good therapeutic lifestyle changes which include good diet and exercise. Fall precautions discussed with patient. If an FOBT was given today- please return it to our front desk. If you are over 58 years old - you may need Prevnar 13 or the adult Pneumonia vaccine.  **Flu shots are available--- please call and schedule a FLU-CLINIC appointment**  After your visit with us today you will receive a survey in the mail or online from [employer] regarding your care with us. Please take a moment to fill this out. Your feedback is very important to us as you can help us better understand your patient needs as well as improve your experience and satisfaction. WE CARE ABOUT YOU!!!   Check urinalysis in 6 months Drink more water Eat less sugar carbs and bread Consider scheduling for yearly dermatological exam for total body skin check Get eyes checked as planned Do not forget to use Debrox eardrops as discussed to help soften cerumen Follow-up with pulmonology yearly as planned According to computer report the next colonoscopy would be due in December 2023.

## 2018-03-23 NOTE — Progress Notes (Signed)
 Subjective:    Patient ID: Justin Pennington, male    DOB: 02/12/1961, 57 y.o.   MRN: [id number]  HPI Patient is here today for annual wellness exam and follow up of chronic medical problems. He is taking medication regularly.  The patient is doing well overall.  He has no specific complaints.  He is due for a general checkup with routine lab work.  His initial blood pressure today was elevated but on repeat was good at 134/71.  His weight is stable and is 180.6.  His body mass index is good at 26.03.  His family history is positive in that his father had bladder cancer and his mother had diabetes.  A maternal aunt had colon cancer.  3 of having had elevated cholesterol but did not tolerate a statin drug he was placed on.  He does use an albuterol inhaler and takes Zyrtec for his allergies.  He also uses a nasal steroid.  He has an Advair Diskus that he is using.  The patient is pleasant and relaxed.  The patient denies any chest pain pressure tightness or shortness of breath.  He denies any trouble with swallowing heartburn indigestion nausea vomiting diarrhea blood in the stool or black tarry bowel movements.  His bowel habits are stable.  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  He does have asthma/COPD and sees the pulmonologist, Dr. Clinton Young yearly and he is on maintenance Advair and as needed albuterol.  He denies any trouble with passing his water burning pain frequency blood in the urine.  He denies any trouble with his sex life.  He is due to get an eye exam soon and will make sure that we get a copy of this report.  He is somewhat concerned because latter cancer which his father had seems to run in the family and I emphasized to him to keep a close check on his urine to make sure they are not darker than usual.    Patient Active Problem List   Diagnosis Date Noted  . Sinobronchitis 11/06/2015  . Hyperlipemia 06/28/2013  . BPH (benign  prostatic hyperplasia) 06/28/2013  . Seasonal and perennial allergic rhinitis 09/05/2010  . Allergic conjunctivitis and rhinitis 06/12/2007  . Chronic obstructive airway disease with asthma (HCC) 06/12/2007  . INSOMNIA UNSPECIFIED 06/12/2007  . IRRITABLE BOWEL SYNDROME, HX OF 06/12/2007   Outpatient Encounter Medications as of 03/23/2018  Medication Sig  . albuterol (PROAIR HFA) 108 (90 Base) MCG/ACT inhaler Inhale 2 puffs into the lungs every 6 (six) hours as needed for wheezing or shortness of breath.  . cetirizine (ZYRTEC) 10 MG tablet Take 1 tablet (10 mg total) by mouth daily.  . finasteride (PROSCAR) 5 MG tablet Take 5 mg by mouth daily.  . fluticasone (FLONASE) 50 MCG/ACT nasal spray Place 2 sprays into both nostrils daily.  . Fluticasone-Salmeterol (ADVAIR DISKUS) 100-50 MCG/DOSE AEPB INHALE 1 PUFF THEN RINSE MOUTH, TWICE DAILY- MAINTENANCE   No facility-administered encounter medications on file as of 03/23/2018.       Review of Systems  Constitutional: Negative.   HENT: Negative.   Eyes: Negative.   Respiratory: Negative.   Cardiovascular: Negative.   Gastrointestinal: Negative.   Endocrine: Negative.   Genitourinary: Negative.   Musculoskeletal: Negative.   Skin: Negative.   Allergic/Immunologic: Negative.   Neurological: Negative.   Hematological: Negative.   Psychiatric/Behavioral: Negative.        Objective:     Physical Exam  Constitutional: He is oriented to person, place, and time. He appears well-developed and well-nourished. No distress.  Patient is pleasant and alert and has no specific complaints other than his worries about the family history of bladder cancer diabetes and hyperlipidemia.  HENT:  Head: Normocephalic and atraumatic.  Right Ear: External ear normal.  Left Ear: External ear normal.  Nose: Nose normal.  Mouth/Throat: Oropharynx is clear and moist. No oropharyngeal exudate.  Mild cerumen in both ear canals, both TMs visualized.  Mouth and  throat were normal.  Eyes: Pupils are equal, round, and reactive to light. Conjunctivae and EOM are normal. Right eye exhibits no discharge. Left eye exhibits no discharge. No scleral icterus.  Reactive to light and accommodation and patient is due to get an eye exam soon and will get get a copy of that report  Neck: Normal range of motion. Neck supple. No thyromegaly present.  No bruits thyromegaly or anterior cervical adenopathy  Cardiovascular: Normal rate, regular rhythm, normal heart sounds and intact distal pulses.  No murmur heard. The heart is regular at 60/min.  No murmurs.  Pulmonary/Chest: Effort normal and breath sounds normal. No respiratory distress. He has no wheezes. He has no rales. He exhibits no tenderness.  No axillary adenopathy and lungs are clear anteriorly and posteriorly no wheezes or chest tightness, no chest wall masses.  Abdominal: Soft. Bowel sounds are normal. He exhibits no mass. There is no tenderness.  No liver or spleen enlargement.  No epigastric tenderness, no bruits no inguinal adenopathy.  Genitourinary: Rectum normal and penis normal.  Genitourinary Comments: Large amount of external hemorrhoids.  Prostate slightly enlarged but soft and smooth no rectal masses external genitalia were within normal limits with no hernias being palpated.  Musculoskeletal: Normal range of motion. He exhibits no edema or tenderness.  Lymphadenopathy:    He has no cervical adenopathy.  Neurological: He is alert and oriented to person, place, and time. He has normal reflexes. No cranial nerve deficit.  Reflexes were equal bilaterally  Skin: Skin is warm and dry. No rash noted.  Psychiatric: He has a normal mood and affect. His behavior is normal. Judgment and thought content normal.  The patient's mood affect and behavior were all normal for him.  Nursing note and vitals reviewed.   BP 140/87 (BP Location: Left Arm)   Pulse 62   Temp (!) 97.3 F (36.3 C) (Oral)   Ht 5'  10" (1.778 m)   Wt 180 lb (81.6 kg)   BMI 25.83 kg/m   EKG with results pending=== Chest x-ray with results pending===     Assessment & Plan:  1. Annual physical exam -Get eye exam as planned and next colonoscopy due in 2023 with Dr. Carlean Purl. - BMP8+EGFR - CBC with Differential/Platelet - Lipid panel - VITAMIN D 25 Hydroxy (Vit-D Deficiency, Fractures) - Hepatic function panel - PSA, total and free - Urinalysis, Complete - DG Chest 2 View; Future - EKG 12-Lead  2. Allergic rhinitis due to pollen, unspecified seasonality -Continue follow-up with pulmonology - DG Chest 2 View; Future  3. Benign prostatic hyperplasia without lower urinary tract symptoms -Patient having no symptoms with this.  4. External hemorrhoids without complication -Abundant amount of external hemorrhoids without problems per patient.  5. Mild intermittent asthma without complication -Continue with inhalers and follow-up with pulmonologist  6. Family history of bladder cancer -Get urinalysis today and repeat in 6 months  7. Impacted cerumen of both ears -Use Debrox as  directed  No orders of the defined types were placed in this encounter.  Patient Instructions  Continue current medications. Continue good therapeutic lifestyle changes which include good diet and exercise. Fall precautions discussed with patient. If an FOBT was given today- please return it to our front desk. If you are over 50 years old - you may need Prevnar 13 or the adult Pneumonia vaccine.  **Flu shots are available--- please call and schedule a FLU-CLINIC appointment**  After your visit with us today you will receive a survey in the mail or online from [employer] regarding your care with us. Please take a moment to fill this out. Your feedback is very important to us as you can help us better understand your patient needs as well as improve your experience and satisfaction. WE CARE ABOUT YOU!!!   Check urinalysis in 6  months Drink more water Eat less sugar carbs and bread Consider scheduling for yearly dermatological exam for total body skin check Get eyes checked as planned Do not forget to use Debrox eardrops as discussed to help soften cerumen Follow-up with pulmonology yearly as planned According to computer report the next colonoscopy would be due in December 2023.  Don W. Moore MD   

## 2018-03-24 ENCOUNTER — Other Ambulatory Visit: Payer: Self-pay | Admitting: *Deleted

## 2018-03-24 LAB — CBC WITH DIFFERENTIAL/PLATELET
Basophils Absolute: 0 10*3/uL (ref 0.0–0.2)
Basos: 0 %
EOS (ABSOLUTE): 0.2 10*3/uL (ref 0.0–0.4)
Eos: 4 %
Hematocrit: 44.8 % (ref 37.5–51.0)
Hemoglobin: 14.8 g/dL (ref 13.0–17.7)
Immature Grans (Abs): 0 10*3/uL (ref 0.0–0.1)
Immature Granulocytes: 0 %
Lymphocytes Absolute: 1.6 10*3/uL (ref 0.7–3.1)
Lymphs: 30 %
MCH: 28.8 pg (ref 26.6–33.0)
MCHC: 33 g/dL (ref 31.5–35.7)
MCV: 87 fL (ref 79–97)
Monocytes Absolute: 0.5 10*3/uL (ref 0.1–0.9)
Monocytes: 9 %
Neutrophils Absolute: 3 10*3/uL (ref 1.4–7.0)
Neutrophils: 57 %
Platelets: 186 10*3/uL (ref 150–450)
RBC: 5.13 x10E6/uL (ref 4.14–5.80)
RDW: 12.9 % (ref 12.3–15.4)
WBC: 5.3 10*3/uL (ref 3.4–10.8)

## 2018-03-24 LAB — BMP8+EGFR
BUN/Creatinine Ratio: 12 (ref 9–20)
BUN: 13 mg/dL (ref 6–24)
CO2: 23 mmol/L (ref 20–29)
Calcium: 9.1 mg/dL (ref 8.7–10.2)
Chloride: 101 mmol/L (ref 96–106)
Creatinine, Ser: 1.13 mg/dL (ref 0.76–1.27)
GFR calc Af Amer: 82 mL/min/{1.73_m2} (ref >59)
GFR calc non Af Amer: 71 mL/min/{1.73_m2} (ref >59)
Glucose: 98 mg/dL (ref 65–99)
Potassium: 4.4 mmol/L (ref 3.5–5.2)
Sodium: 139 mmol/L (ref 134–144)

## 2018-03-24 LAB — PSA, TOTAL AND FREE
PSA, Free Pct: 26 %
PSA, Free: 0.13 ng/mL
Prostate Specific Ag, Serum: 0.5 ng/mL (ref 0.0–4.0)

## 2018-03-24 LAB — LIPID PANEL
Chol/HDL Ratio: 5.2 ratio — ABNORMAL HIGH (ref 0.0–5.0)
Cholesterol, Total: 208 mg/dL — ABNORMAL HIGH (ref 100–199)
HDL: 40 mg/dL (ref >39)
LDL Calculated: 135 mg/dL — ABNORMAL HIGH (ref 0–99)
Triglycerides: 163 mg/dL — ABNORMAL HIGH (ref 0–149)
VLDL Cholesterol Cal: 33 mg/dL (ref 5–40)

## 2018-03-24 LAB — HEPATIC FUNCTION PANEL
ALT: 35 IU/L (ref 0–44)
AST: 19 IU/L (ref 0–40)
Albumin: 4.6 g/dL (ref 3.5–5.5)
Alkaline Phosphatase: 73 IU/L (ref 39–117)
Bilirubin Total: 0.7 mg/dL (ref 0.0–1.2)
Bilirubin, Direct: 0.14 mg/dL (ref 0.00–0.40)
Total Protein: 7.3 g/dL (ref 6.0–8.5)

## 2018-03-24 LAB — VITAMIN D 25 HYDROXY (VIT D DEFICIENCY, FRACTURES): Vit D, 25-Hydroxy: 23.9 ng/mL — ABNORMAL LOW (ref 30.0–100.0)

## 2018-03-24 MED ORDER — VITAMIN D (ERGOCALCIFEROL) 1.25 MG (50000 UNIT) PO CAPS: 50000.0000 [IU] | ORAL_CAPSULE | ORAL | 3 refills | Status: DC

## 2018-04-11 ENCOUNTER — Ambulatory Visit (INDEPENDENT_AMBULATORY_CARE_PROVIDER_SITE_OTHER): Payer: BLUE CROSS/BLUE SHIELD | Admitting: Internal Medicine

## 2018-04-11 DIAGNOSIS — J449 Chronic obstructive pulmonary disease, unspecified: Secondary | ICD-10-CM

## 2018-04-11 LAB — PULMONARY FUNCTION TEST
DL/VA % pred: 116 %
DL/VA: 5.25 ml/min/mmHg/L
DLCO cor % pred: 94 %
DLCO cor: 27.96 ml/min/mmHg
DLCO unc % pred: 94 %
DLCO unc: 28.12 ml/min/mmHg
FEF 25-75 Post: 1.29 L/sec
FEF 25-75 Pre: 1.03 L/sec
FEF2575-%Change-Post: 24 %
FEF2575-%Pred-Post: 44 %
FEF2575-%Pred-Pre: 35 %
FEV1-%Change-Post: 7 %
FEV1-%Pred-Post: 61 %
FEV1-%Pred-Pre: 57 %
FEV1-Post: 2.11 L
FEV1-Pre: 1.96 L
FEV1FVC-%Change-Post: 2 %
FEV1FVC-%Pred-Pre: 77 %
FEV6-%Change-Post: 4 %
FEV6-%Pred-Post: 79 %
FEV6-%Pred-Pre: 76 %
FEV6-Post: 3.42 L
FEV6-Pre: 3.28 L
FEV6FVC-%Change-Post: 0 %
FEV6FVC-%Pred-Post: 101 %
FEV6FVC-%Pred-Pre: 102 %
FVC-%Change-Post: 4 %
FVC-%Pred-Post: 77 %
FVC-%Pred-Pre: 74 %
FVC-Post: 3.5 L
FVC-Pre: 3.34 L
Post FEV1/FVC ratio: 60 %
Post FEV6/FVC ratio: 98 %
Pre FEV1/FVC ratio: 59 %
Pre FEV6/FVC Ratio: 98 %
RV % pred: 116 %
RV: 2.44 L
TLC % pred: 90 %
TLC: 5.94 L

## 2018-04-11 NOTE — Progress Notes (Signed)
PFT done today. 

## 2018-06-14 ENCOUNTER — Other Ambulatory Visit: Payer: Self-pay

## 2018-06-14 MED ORDER — FLUTICASONE-SALMETEROL 100-50 MCG/DOSE IN AEPB: INHALATION_SPRAY | RESPIRATORY_TRACT | 3 refills | Status: DC

## 2019-01-16 ENCOUNTER — Ambulatory Visit: Payer: PRIVATE HEALTH INSURANCE | Admitting: Internal Medicine

## 2019-01-20 ENCOUNTER — Encounter: Payer: Self-pay | Admitting: *Deleted

## 2019-03-27 ENCOUNTER — Ambulatory Visit: Payer: BLUE CROSS/BLUE SHIELD | Admitting: Family Medicine

## 2019-03-28 ENCOUNTER — Encounter: Payer: BLUE CROSS/BLUE SHIELD | Admitting: Family

## 2019-03-29 ENCOUNTER — Encounter: Payer: BLUE CROSS/BLUE SHIELD | Admitting: Family Medicine

## 2019-04-05 ENCOUNTER — Other Ambulatory Visit: Payer: Self-pay

## 2019-04-06 ENCOUNTER — Ambulatory Visit (INDEPENDENT_AMBULATORY_CARE_PROVIDER_SITE_OTHER): Payer: BC Managed Care – PPO | Admitting: Family

## 2019-04-06 ENCOUNTER — Encounter: Payer: Self-pay | Admitting: Family

## 2019-04-06 VITALS — BP 148/87 | HR 65 | Temp 97.1°F | Ht 70.0 in | Wt 183.8 lb

## 2019-04-06 DIAGNOSIS — Z0001 Encounter for general adult medical examination with abnormal findings: Secondary | ICD-10-CM | POA: Diagnosis not present

## 2019-04-06 DIAGNOSIS — Z23 Encounter for immunization: Secondary | ICD-10-CM

## 2019-04-06 DIAGNOSIS — E785 Hyperlipidemia, unspecified: Secondary | ICD-10-CM

## 2019-04-06 DIAGNOSIS — Z1159 Encounter for screening for other viral diseases: Secondary | ICD-10-CM

## 2019-04-06 DIAGNOSIS — Z0000 Encounter for general adult medical examination without abnormal findings: Secondary | ICD-10-CM | POA: Diagnosis not present

## 2019-04-06 DIAGNOSIS — Z114 Encounter for screening for human immunodeficiency virus [HIV]: Secondary | ICD-10-CM

## 2019-04-06 DIAGNOSIS — R351 Nocturia: Secondary | ICD-10-CM

## 2019-04-06 DIAGNOSIS — N401 Enlarged prostate with lower urinary tract symptoms: Secondary | ICD-10-CM

## 2019-04-06 DIAGNOSIS — Z8052 Family history of malignant neoplasm of bladder: Secondary | ICD-10-CM

## 2019-04-06 DIAGNOSIS — J449 Chronic obstructive pulmonary disease, unspecified: Secondary | ICD-10-CM

## 2019-04-06 DIAGNOSIS — J4489 Other specified chronic obstructive pulmonary disease: Secondary | ICD-10-CM

## 2019-04-06 LAB — URINALYSIS, COMPLETE
Bilirubin, UA: NEGATIVE
Glucose, UA: NEGATIVE
Ketones, UA: NEGATIVE
Leukocytes,UA: NEGATIVE
Nitrite, UA: NEGATIVE
Protein,UA: NEGATIVE
RBC, UA: NEGATIVE
Specific Gravity, UA: >1.03 — ABNORMAL HIGH (ref 1.005–1.030)
Urobilinogen, Ur: 0.2 mg/dL (ref 0.2–1.0)
pH, UA: 5.5 (ref 5.0–7.5)

## 2019-04-06 LAB — MICROSCOPIC EXAMINATION
Bacteria, UA: NONE SEEN
Epithelial Cells (non renal): NONE SEEN /hpf (ref 0–10)
RBC, Urine: NONE SEEN /hpf (ref 0–2)
Renal Epithel, UA: NONE SEEN /hpf
WBC, UA: NONE SEEN /hpf (ref 0–5)

## 2019-04-06 NOTE — Progress Notes (Signed)
Subjective:    Patient ID: Justin Pennington, male    DOB: 10/07/1960, 58 y.o.   MRN: [id number]  Chief Complaint  Patient presents with  . Annual Exam   Pt presents to the office today for CPE. Pt is followed by Pulmonologist's  annually for COPD and asthma. He states this is stable.   His BP is elevated today. He reports he takes his BP regularly at home and it has been 135/83 at home. He states his wife is currently going through chemotherapy and has been checking her BP at home daily and checks his also. He states he is slightly increased at this time.  Asthma He complains of hoarse voice. There is no cough or wheezing. This is a chronic problem. The current episode started more than 1 year ago. The problem occurs intermittently. The problem has been waxing and waning. His symptoms are aggravated by any activity. He reports moderate improvement on treatment. His symptoms are not alleviated by cold air. His past medical history is significant for asthma.  Benign Prostatic Hypertrophy This is a chronic problem. The current episode started more than 1 year ago. The problem has been waxing and waning since onset. Irritative symptoms include nocturia. Past treatments include nothing. The treatment provided no relief.  Hyperlipidemia This is a chronic problem. The current episode started more than 1 year ago. The problem is uncontrolled. Recent lipid tests were reviewed and are high. Exacerbating diseases include obesity. Current antihyperlipidemic treatment includes diet change. The current treatment provides moderate improvement of lipids. Risk factors for coronary artery disease include dyslipidemia, male sex and post-menopausal.      Review of Systems  HENT: Positive for hoarse voice.   Respiratory: Negative for cough and wheezing.   Genitourinary: Positive for nocturia.  All other systems reviewed and are negative.   Family History  Problem Relation Age of Onset  . Cancer Father    BLADDER  . Colon cancer Maternal Aunt 50  . Diabetes Mother        borderline diabetes  . Asthma Brother   . Stomach cancer Neg Hx    Social History   Socioeconomic History  . Marital status: Married    Spouse name: Not on file  . Number of children: Not on file  . Years of education: Not on file  . Highest education level: Not on file  Occupational History  . Not on file  Social Needs  . Financial resource strain: Not on file  . Food insecurity    Worry: Not on file    Inability: Not on file  . Transportation needs    Medical: Not on file    Non-medical: Not on file  Tobacco Use  . Smoking status: Never Smoker  . Smokeless tobacco: Never Used  Substance and Sexual Activity  . Alcohol use: Yes    Comment: occasional  . Drug use: No  . Sexual activity: Not on file  Lifestyle  . Physical activity    Days per week: Not on file    Minutes per session: Not on file  . Stress: Not on file  Relationships  . Social Herbalist on phone: Not on file    Gets together: Not on file    Attends religious service: Not on file    Active member of club or organization: Not on file    Attends meetings of clubs or organizations: Not on file    Relationship status: Not on file  .  Intimate partner violence    Fear of current or ex partner: Not on file    Emotionally abused: Not on file    Physically abused: Not on file    Forced sexual activity: Not on file  Other Topics Concern  . Not on file  Social History Narrative  . Not on file       Objective:   Physical Exam Vitals signs reviewed.  Constitutional:      General: He is not in acute distress.    Appearance: He is well-developed.  HENT:     Head: Normocephalic.     Right Ear: Tympanic membrane normal.     Left Ear: Tympanic membrane normal.  Eyes:     General:        Right eye: No discharge.        Left eye: No discharge.     Pupils: Pupils are equal, round, and reactive to light.  Neck:      Musculoskeletal: Normal range of motion and neck supple.     Thyroid: No thyromegaly.  Cardiovascular:     Rate and Rhythm: Normal rate and regular rhythm.     Heart sounds: Normal heart sounds. No murmur.  Pulmonary:     Effort: Pulmonary effort is normal. No respiratory distress.     Breath sounds: Normal breath sounds. No wheezing.  Abdominal:     General: Bowel sounds are normal. There is no distension.     Palpations: Abdomen is soft.     Tenderness: There is no abdominal tenderness.  Musculoskeletal: Normal range of motion.        General: No tenderness.  Skin:    General: Skin is warm and dry.     Findings: No erythema or rash.  Neurological:     Mental Status: He is alert and oriented to person, place, and time.     Cranial Nerves: No cranial nerve deficit.     Deep Tendon Reflexes: Reflexes are normal and symmetric.  Psychiatric:        Behavior: Behavior normal.        Thought Content: Thought content normal.        Judgment: Judgment normal.       BP (!) 148/87   Pulse 65   Temp (!) 97.1 F (36.2 C) (Temporal)   Ht '5\' 10"'  (1.778 m)   Wt 183 lb 12.8 oz (83.4 kg)   SpO2 100%   BMI 26.37 kg/m      Assessment & Plan:  Kenley Troop comes in today with chief complaint of Annual Exam   Diagnosis and orders addressed:  1. Annual physical exam - CMP14+EGFR - CBC with Differential/Platelet - Lipid panel - PSA, total and free - TSH - Hepatitis C antibody - HIV Antibody (routine testing w rflx)  2. Chronic obstructive airway disease with asthma (HCC) - CMP14+EGFR - CBC with Differential/Platelet  3. Benign prostatic hyperplasia with nocturia - CMP14+EGFR - CBC with Differential/Platelet  4. Hyperlipidemia, unspecified hyperlipidemia type - CMP14+EGFR - CBC with Differential/Platelet  5. Need for hepatitis C screening test - CMP14+EGFR - CBC with Differential/Platelet - Hepatitis C antibody  6. Screening for HIV (human immunodeficiency virus) -  CMP14+EGFR - CBC with Differential/Platelet - HIV Antibody (routine testing w rflx)  7. Family history of bladder cancer - Urinalysis, Complete   Labs pending Health Maintenance reviewed Diet and exercise encouraged  Follow up plan: 1 year   Evelina Dun, FNP

## 2019-04-06 NOTE — Patient Instructions (Signed)

## 2019-04-07 ENCOUNTER — Other Ambulatory Visit: Payer: Self-pay | Admitting: Family

## 2019-04-07 LAB — CMP14+EGFR
ALT: 36 IU/L (ref 0–44)
AST: 22 IU/L (ref 0–40)
Albumin/Globulin Ratio: 1.5 (ref 1.2–2.2)
Albumin: 4.4 g/dL (ref 3.8–4.9)
Alkaline Phosphatase: 79 IU/L (ref 39–117)
BUN/Creatinine Ratio: 11 (ref 9–20)
BUN: 13 mg/dL (ref 6–24)
Bilirubin Total: 0.7 mg/dL (ref 0.0–1.2)
CO2: 23 mmol/L (ref 20–29)
Calcium: 9.5 mg/dL (ref 8.7–10.2)
Chloride: 100 mmol/L (ref 96–106)
Creatinine, Ser: 1.19 mg/dL (ref 0.76–1.27)
GFR calc Af Amer: 77 mL/min/{1.73_m2} (ref >59)
GFR calc non Af Amer: 66 mL/min/{1.73_m2} (ref >59)
Globulin, Total: 3 g/dL (ref 1.5–4.5)
Glucose: 97 mg/dL (ref 65–99)
Potassium: 5 mmol/L (ref 3.5–5.2)
Sodium: 137 mmol/L (ref 134–144)
Total Protein: 7.4 g/dL (ref 6.0–8.5)

## 2019-04-07 LAB — CBC WITH DIFFERENTIAL/PLATELET
Basophils Absolute: 0 10*3/uL (ref 0.0–0.2)
Basos: 1 %
EOS (ABSOLUTE): 0.3 10*3/uL (ref 0.0–0.4)
Eos: 5 %
Hematocrit: 49.2 % (ref 37.5–51.0)
Hemoglobin: 16.1 g/dL (ref 13.0–17.7)
Immature Grans (Abs): 0 10*3/uL (ref 0.0–0.1)
Immature Granulocytes: 0 %
Lymphocytes Absolute: 1.7 10*3/uL (ref 0.7–3.1)
Lymphs: 30 %
MCH: 28.3 pg (ref 26.6–33.0)
MCHC: 32.7 g/dL (ref 31.5–35.7)
MCV: 87 fL (ref 79–97)
Monocytes Absolute: 0.6 10*3/uL (ref 0.1–0.9)
Monocytes: 11 %
Neutrophils Absolute: 3.1 10*3/uL (ref 1.4–7.0)
Neutrophils: 53 %
Platelets: 189 10*3/uL (ref 150–450)
RBC: 5.69 x10E6/uL (ref 4.14–5.80)
RDW: 13 % (ref 11.6–15.4)
WBC: 5.7 10*3/uL (ref 3.4–10.8)

## 2019-04-07 LAB — LIPID PANEL
Chol/HDL Ratio: 5.4 ratio — ABNORMAL HIGH (ref 0.0–5.0)
Cholesterol, Total: 206 mg/dL — ABNORMAL HIGH (ref 100–199)
HDL: 38 mg/dL — ABNORMAL LOW (ref >39)
LDL Chol Calc (NIH): 142 mg/dL — ABNORMAL HIGH (ref 0–99)
Triglycerides: 143 mg/dL (ref 0–149)
VLDL Cholesterol Cal: 26 mg/dL (ref 5–40)

## 2019-04-07 LAB — HIV ANTIBODY (ROUTINE TESTING W REFLEX): HIV Screen 4th Generation wRfx: NONREACTIVE

## 2019-04-07 LAB — PSA, TOTAL AND FREE
PSA, Free Pct: 22.9 %
PSA, Free: 0.16 ng/mL
Prostate Specific Ag, Serum: 0.7 ng/mL (ref 0.0–4.0)

## 2019-04-07 LAB — TSH: TSH: 1.6 u[IU]/mL (ref 0.450–4.500)

## 2019-04-07 LAB — HEPATITIS C ANTIBODY: Hep C Virus Ab: <0.1 s/co ratio (ref 0.0–0.9)

## 2019-04-07 MED ORDER — LIVALO 2 MG PO TABS: 2.0000 mg | ORAL_TABLET | Freq: Every day | ORAL | 1 refills | Status: DC

## 2019-05-10 ENCOUNTER — Ambulatory Visit (INDEPENDENT_AMBULATORY_CARE_PROVIDER_SITE_OTHER): Payer: BC Managed Care – PPO | Admitting: Internal Medicine

## 2019-05-10 ENCOUNTER — Encounter: Payer: Self-pay | Admitting: Internal Medicine

## 2019-05-10 ENCOUNTER — Other Ambulatory Visit: Payer: Self-pay

## 2019-05-10 DIAGNOSIS — J4489 Other specified chronic obstructive pulmonary disease: Secondary | ICD-10-CM

## 2019-05-10 DIAGNOSIS — J309 Allergic rhinitis, unspecified: Secondary | ICD-10-CM | POA: Diagnosis not present

## 2019-05-10 DIAGNOSIS — H1013 Acute atopic conjunctivitis, bilateral: Secondary | ICD-10-CM

## 2019-05-10 DIAGNOSIS — J449 Chronic obstructive pulmonary disease, unspecified: Secondary | ICD-10-CM | POA: Diagnosis not present

## 2019-05-10 MED ORDER — ANORO ELLIPTA 62.5-25 MCG/INH IN AEPB: 1.0000 | INHALATION_SPRAY | Freq: Every day | RESPIRATORY_TRACT | 0 refills | Status: DC

## 2019-05-10 NOTE — Assessment & Plan Note (Signed)
Mild seasonal exacerbation Plan- flonase, antihistamine

## 2019-05-10 NOTE — Patient Instructions (Signed)
Sample Anoro inhaler     Inhale 1 puff, once daily     Try this instead of Advair for comparison. If you like it, call us for a prescription.  Please call if we can help

## 2019-05-10 NOTE — Assessment & Plan Note (Addendum)
Chronic fixed obstructive airways, likely from pneumonia as infant. Gets some benefit from bronchodilators. Would like to see if Anoro helps breathing and if removing steroid helps hoarseness. Plan- sample Anoro

## 2019-05-10 NOTE — Progress Notes (Signed)
HPI male never smoker followed for chronic obstructive asthma ( a1AT MM, history of pneumonia at age 59 or 31 months) , allergic conjunctivitis, allergic rhinitis Office spirometry 08/24/2013-moderately severe obstruction. FVC 3.64/77%, FEV1 2.13/57%, FEV1/FVC 0.59, FEF 25-75% 1.23/33% FENO 10/27/16-  34 H PFT 04/11/09- Moderately severe obstruction, no resp to BD, no restriction, NL DLCO   FEV1/FVC 0.60 ---------------------------------------------------------------------------------------------------- 01/13/2018- 58 year old male never smoker followed for chronic obstructive asthma ( a1AT MM), allergic conjunctivitis, allergic rhinitis ----Asthma: Pt denies any trouble with breathing overall.  Advair 100, pro-air HFA, Flonase Frequent throat clearing, occasional heartburn.  Does not wake at night strangled or choking. Occasional sinus pressure.  Little sneezing or blowing.  Flonase works when needed. Not much affected by his breathing-does not slow him down.  Uses rescue inhaler once or twice a week.  Gives history of pneumonia at age 56 or 38 months which may explain his chronic obstructive airways disease.  05/10/2019- 58 year old male never smoker followed for chronic obstructive asthma  ( a1AT MM, history of pneumonia at age 89 or 82 months ), allergic conjunctivitis, allergic rhinitis Advair 100, pro-air HFA, Flonase -----71yr f/u for COPD. States his breathing has been great since last visit.  Doesn't notice dyspnea, cough or wheeze. Continues to work.  Wife being treated for breast cancer. He has had flu vax but waiting for guidance before getting shingles vaccine- to discuss with her oncologist.  Uses rescue inhaler each AM, then 1-2x/ week otherwise.  PFT 04/11/09- Moderately severe obstruction, no resp to BD, no restriction, NL DLCO   FEV1/FVC 0.60 Recent seasonal nasal stuffy and drainage. Flonase usually sufficient. CXR 03/23/18- IMPRESSION: 1. No acute cardiopulmonary  abnormalities.   ROS-see HPI    + = positive Constitutional:   No-   weight loss, night sweats, fevers, chills, fatigue, lassitude. HEENT:   No-  headaches, difficulty swallowing, tooth/dental problems, sore throat,       No-  sneezing, itching, ear ache, nasal congestion, post nasal drip,  CV:  No-   chest pain, orthopnea, PND, swelling in lower extremities, anasarca,  dizziness, palpitations Resp: +shortness of breath with exertion or at rest.              No-   productive cough,  No non-productive cough,  No- coughing up of blood.              No-   change in color of mucus.  + wheezing.   Skin: No-   rash or lesions. GI:  No-   heartburn, indigestion, abdominal pain, nausea, GU: . MS:  No-   joint pain or swelling.  Neuro-     nothing unusual Psych:  No- change in mood or affect. No depression or anxiety.  No memory loss.  OBJ- Physical Exam General- Alert, Oriented, Affect-appropriate, Distress- none acute, fit-appearing Skin- rash-none, lesions- none, excoriation- none Lymphadenopathy- none Head- atraumatic            Eyes- Gross vision intact, PERRLA, conjunctivae and secretions clear            Ears- Hearing, canals-normal            Nose- Clear, no-Septal dev, mucus, polyps, erosion, perforation.             Throat- Mallampati II , mucosa clear , drainage- none, tonsils- atrophic, + hoarse Neck- flexible , trachea midline, no stridor , thyroid nl, carotid no bruit Chest - symmetrical excursion , unlabored  Heart/CV- RRR , no murmur , no gallop  , no rub, nl s1 s2                           - JVD- none , edema- none, stasis changes- none, varices- none           Lung- clear to P&A/=+ diminished, wheeze- none, cough- none , dullness-none, rub- none           Chest wall-  Abd-  Br/ Gen/ Rectal- Not done, not indicated Extrem- cyanosis- none, clubbing, none, atrophy- none, strength- nl Neuro- grossly intact to observation

## 2019-06-22 ENCOUNTER — Telehealth: Payer: Self-pay | Admitting: Internal Medicine

## 2019-06-22 ENCOUNTER — Other Ambulatory Visit: Payer: Self-pay | Admitting: Internal Medicine

## 2019-06-22 MED ORDER — ALBUTEROL SULFATE HFA 108 (90 BASE) MCG/ACT IN AERS: INHALATION_SPRAY | RESPIRATORY_TRACT | 3 refills | Status: DC

## 2019-06-22 NOTE — Telephone Encounter (Signed)
Spoke with the pt and he states needing a refill on his albuterol inhaler sent to the [facility] in [place]  Rx sent  Nothing further needed

## 2019-07-17 ENCOUNTER — Telehealth: Payer: Self-pay | Admitting: Internal Medicine

## 2019-07-17 NOTE — Telephone Encounter (Signed)
Spoke with pt, advised that [place] is currently adinistering the first subgroup of phase 1B, and that per CDC and [employer] guidelines he falls under subgroup 2 of Phase 2.  I advised pt to follow up with [employer] website to see when vaccine is available for him, and he can sign up for a vaccine appt on that website as well.  Pt expressed understanding.  Nothing further needed at this time- will close encounter.

## 2019-08-14 ENCOUNTER — Ambulatory Visit (INDEPENDENT_AMBULATORY_CARE_PROVIDER_SITE_OTHER): Payer: Commercial Managed Care - PPO | Admitting: Primary Care

## 2019-08-14 ENCOUNTER — Other Ambulatory Visit: Payer: Self-pay

## 2019-08-14 ENCOUNTER — Encounter: Payer: Self-pay | Admitting: Primary Care

## 2019-08-14 DIAGNOSIS — J441 Chronic obstructive pulmonary disease with (acute) exacerbation: Secondary | ICD-10-CM | POA: Diagnosis not present

## 2019-08-14 DIAGNOSIS — J45901 Unspecified asthma with (acute) exacerbation: Secondary | ICD-10-CM | POA: Diagnosis not present

## 2019-08-14 MED ORDER — PREDNISONE 10 MG PO TABS: ORAL_TABLET | ORAL | 0 refills | Status: DC

## 2019-08-14 NOTE — Progress Notes (Signed)
Virtual Visit via Telephone Note  I connected with Justin Pennington on 08/14/19 at  4:00 PM EST by telephone and verified that I am speaking with the correct person using two identifiers.  Location: Patient: Home Provider: Office   I discussed the limitations, risks, security and privacy concerns of performing an evaluation and management service by telephone and the availability of in person appointments. I also discussed with the patient that there may be a patient responsible charge related to this service. The patient expressed understanding and agreed to proceed.   History of Present Illness: 59 year old male, never smoked.  Past medical history significant for COPD with asthma (fixed obstruction from childhood PNA), allergic rhinitis, seasonal allergies, BPH, hyperlipidemia, insomnia, irritable bowel syndrome.  A1AT MM, history of pneumonia at age 67 or 63 months. Patient of Dr. Maple Hudson, last seen on on 05/10/19 for annual follow-up. Reports benefit from bronchodilators, tried Anoro at last visit with no benefit. This was changed back to Advair 100 recently.  08/14/2019 Patient contacted today for acute televisit.  Reports that he has had increased shortness of breath over the last 3 weeks.  Mild associated chest tightness.  No significant cough or wheezing.  He has been using his rescue inhaler 2-3 times a day with improvement.  He only uses his Advair in the morning which patient states that Dr. Maple Hudson is aware of.  He does have some nocturnal asthma symptoms.  Typically responds to steroid tapers within a week.  No known Covid exposure.  Denies fever, chills, sweats, chest pain, purulent mucus, sinusitis, GI symptoms.  Observations/Objective:  -Able to talk in full sentences with no signs of respiratory distress -No noticeable wheezing or cough -Patient clears his throat while speaking occasionally  Pulmonary testing: Office spirometry 08/24/2013-moderately severe obstruction. FVC 3.64/77%,  FEV1 2.13/57%, FEV1/FVC 0.59, FEF 25-75% 1.23/33% PFT 04/11/09- Moderately severe obstruction, no resp to BD, no restriction, NL DLCO   FEV1/FVC 0.60 FENO 10/27/16-  34 H  Assessment and Plan:  COPD with asthma - Increased shortness of breath x 3 weeks. Associated chest tightness. No mucus production - Mild exacerbation, treating with prednisone taper - Continue Advair 100 one puff once daily; albuterol every 4-6 hours as needed for breakthrough shortness of breath/wheezing - Rx prednisone 40 mg x 2 days, 30 mg x 2 days, 20 mg x 2 days, 10 mg x 2 days - If no improvement after taking full course of oral steroids recommend increasing Advair to twice daily dosing  Follow Up Instructions:   - Call if symptoms do not improve with above plan; otherwise due for annual follow-up in November 2021 with Dr. Maple Hudson  I discussed the assessment and treatment plan with the patient. The patient was provided an opportunity to ask questions and all were answered. The patient agreed with the plan and demonstrated an understanding of the instructions.   The patient was advised to call back or seek an in-person evaluation if the symptoms worsen or if the condition fails to improve as anticipated.  I provided 18 minutes of non-face-to-face time during this encounter.   Glenford Bayley, NP

## 2019-08-14 NOTE — Patient Instructions (Signed)
COPD with asthma Mild exacerbation, treating with prednisone taper If no improvement after taking full course of oral steroids recommend increasing Advair to twice daily dosing  Follow-up Call or return if symptoms do not improve with above plan Due for annual visit with Dr. Maple Hudson November 2021

## 2019-12-12 ENCOUNTER — Telehealth: Payer: Self-pay | Admitting: Internal Medicine

## 2019-12-12 MED ORDER — FLUTICASONE-SALMETEROL 100-50 MCG/DOSE IN AEPB: INHALATION_SPRAY | RESPIRATORY_TRACT | 3 refills | Status: DC

## 2019-12-12 NOTE — Telephone Encounter (Signed)
Spoke with pt. He is needing a refill on Advair. States his insurance changed and he had to change pharmacies. Rx has been sent in. Nothing further was needed.

## 2020-01-01 ENCOUNTER — Telehealth: Payer: Self-pay | Admitting: Internal Medicine

## 2020-01-01 NOTE — Telephone Encounter (Signed)
Patient is having uncontrollable BM please advise

## 2020-01-02 NOTE — Telephone Encounter (Signed)
Called patient and he has chronic diarrhea with urgency. Scheduled office visit with Dr. Leone Payor, first available  02/20/20

## 2020-02-07 ENCOUNTER — Ambulatory Visit (INDEPENDENT_AMBULATORY_CARE_PROVIDER_SITE_OTHER): Payer: Commercial Managed Care - PPO | Admitting: Family Medicine

## 2020-02-07 ENCOUNTER — Encounter: Payer: Self-pay | Admitting: Family Medicine

## 2020-02-07 ENCOUNTER — Other Ambulatory Visit: Payer: Self-pay

## 2020-02-07 VITALS — BP 149/77 | HR 67 | Temp 97.8°F | Ht 70.0 in | Wt 186.0 lb

## 2020-02-07 DIAGNOSIS — M25512 Pain in left shoulder: Secondary | ICD-10-CM

## 2020-02-07 MED ORDER — METHYLPREDNISOLONE ACETATE 80 MG/ML IJ SUSP
80.0000 mg | Freq: Once | INTRAMUSCULAR | Status: AC
  Administered 2020-02-07: 80 mg via INTRAMUSCULAR

## 2020-02-07 MED ORDER — PREDNISONE 10 MG (21) PO TBPK: ORAL_TABLET | ORAL | 0 refills | Status: DC

## 2020-02-07 NOTE — Progress Notes (Signed)
Assessment & Plan:  1. Acute pain of left shoulder - Education provided on shoulder exercises. Encouraged NSAIDs, muscle rub, and heating pad.  - methylPREDNISolone acetate (DEPO-MEDROL) injection 80 mg - predniSONE (STERAPRED UNI-PAK 21 TAB) 10 MG (21) TBPK tablet; As directed x 6 days  Dispense: 21 tablet; Refill: 0   Follow up plan: No follow-ups on file.  Justin Boston, MSN, APRN, FNP-C [facility]  Subjective:   Patient ID: Justin Pennington, male    DOB: 12/30/1960, 59 y.o.   MRN: [id number]  HPI: Justin Pennington is a 59 y.o. male presenting on 02/07/2020 for Shoulder Pain (Patient states that he started having left shoulder pain after moving stuff this weekend.)  Shoulder Pain: Patient complaints of left shoulder pain. This is evaluated as a personal injury.  Patient was fixing his hot water heater and moving furniture over the weekend. The pain is described as throbbing and feeling like something is getting bunched together.  The onset of the pain was 3 days ago.  The pain occurs when active.  Location is lateral. No history of dislocation. Symptoms are aggravated by all activities. Symptoms are diminished by  medication: Ibuprofen used and beneficial.   Limited activities include: all activities. No stiffness, no weakness, no swelling is reported.    ROS: Negative unless specifically indicated above in HPI.   Relevant past medical history reviewed and updated as indicated.   Allergies and medications reviewed and updated.   Current Outpatient Medications:  .  albuterol (VENTOLIN HFA) 108 (90 Base) MCG/ACT inhaler, INHALE 2 PUFFS BY MOUTH EVERY 6 HOURS AS NEEDED FOR WHEEZING OR SHORTNESS OF BREATH, Disp: 8.5 g, Rfl: 3 .  cetirizine (ZYRTEC) 10 MG tablet, Take 1 tablet (10 mg total) by mouth daily., Disp: 30 tablet, Rfl: 11 .  finasteride (PROPECIA) 1 MG tablet, Take 1 mg by mouth daily., Disp: , Rfl:  .  fluticasone (FLONASE) 50 MCG/ACT nasal spray, Place 2  sprays into both nostrils daily., Disp: 16 g, Rfl: 6 .  Fluticasone-Salmeterol (ADVAIR DISKUS) 100-50 MCG/DOSE AEPB, INHALE 1 PUFF THEN RINSE MOUTH, TWICE DAILY- MAINTENANCE, Disp: 180 each, Rfl: 3  Allergies  Allergen Reactions  . Crestor [Rosuvastatin]     Joint stiffness  . Lipitor [Atorvastatin Calcium] Other (See Comments)    Joint stiffness    Objective:   BP (!) 149/77   Pulse 67   Temp 97.8 F (36.6 C) (Temporal)   Ht 5\' 10"  (1.778 m)   Wt 186 lb (84.4 kg)   SpO2 98%   BMI 26.69 kg/m    Physical Exam Vitals reviewed.  Constitutional:      General: He is not in acute distress.    Appearance: Normal appearance. He is not ill-appearing, toxic-appearing or diaphoretic.  HENT:     Head: Normocephalic and atraumatic.  Eyes:     General: No scleral icterus.       Right eye: No discharge.        Left eye: No discharge.     Conjunctiva/sclera: Conjunctivae normal.  Cardiovascular:     Rate and Rhythm: Normal rate.  Pulmonary:     Effort: Pulmonary effort is normal. No respiratory distress.  Musculoskeletal:        General: Normal range of motion.     Right shoulder: Tenderness present. No swelling, deformity, effusion, laceration, bony tenderness or crepitus. Normal range of motion. Normal strength. Normal pulse.     Cervical back: Normal range of motion.  Skin:  General: Skin is warm and dry.  Neurological:     Mental Status: He is alert and oriented to person, place, and time. Mental status is at baseline.  Psychiatric:        Mood and Affect: Mood normal.        Behavior: Behavior normal.        Thought Content: Thought content normal.        Judgment: Judgment normal.

## 2020-02-07 NOTE — Patient Instructions (Signed)
Muscle rubs - Biofreeze, [place] Gel, Horse Liniment Heating pad   Shoulder Exercises Ask your health care provider which exercises are safe for you. Do exercises exactly as told by your health care provider and adjust them as directed. It is normal to feel mild stretching, pulling, tightness, or discomfort as you do these exercises. Stop right away if you feel sudden pain or your pain gets worse. Do not begin these exercises until told by your health care provider. Stretching exercises External rotation and abduction This exercise is sometimes called corner stretch. This exercise rotates your arm outward (external rotation) and moves your arm out from your body (abduction). 1. Stand in a doorway with one of your feet slightly in front of the other. This is called a staggered stance. If you cannot reach your forearms to the door frame, stand facing a corner of a room. 2. Choose one of the following positions as told by your health care provider: ? Place your hands and forearms on the door frame above your head. ? Place your hands and forearms on the door frame at the height of your head. ? Place your hands on the door frame at the height of your elbows. 3. Slowly move your weight onto your front foot until you feel a stretch across your chest and in the front of your shoulders. Keep your head and chest upright and keep your abdominal muscles tight. 4. Hold for __________ seconds. 5. To release the stretch, shift your weight to your back foot. Repeat __________ times. Complete this exercise __________ times a day. Extension, standing 1. Stand and hold a broomstick, a cane, or a similar object behind your back. ? Your hands should be a little wider than shoulder width apart. ? Your palms should face away from your back. 2. Keeping your elbows straight and your shoulder muscles relaxed, move the stick away from your body until you feel a stretch in your shoulders (extension). ? Avoid shrugging your  shoulders while you move the stick. Keep your shoulder blades tucked down toward the middle of your back. 3. Hold for __________ seconds. 4. Slowly return to the starting position. Repeat __________ times. Complete this exercise __________ times a day. Range-of-motion exercises Pendulum  1. Stand near a wall or a surface that you can hold onto for balance. 2. Bend at the waist and let your left / right arm hang straight down. Use your other arm to support you. Keep your back straight and do not lock your knees. 3. Relax your left / right arm and shoulder muscles, and move your hips and your trunk so your left / right arm swings freely. Your arm should swing because of the motion of your body, not because you are using your arm or shoulder muscles. 4. Keep moving your hips and trunk so your arm swings in the following directions, as told by your health care provider: ? Side to side. ? Forward and backward. ? In clockwise and counterclockwise circles. 5. Continue each motion for __________ seconds, or for as long as told by your health care provider. 6. Slowly return to the starting position. Repeat __________ times. Complete this exercise __________ times a day. Shoulder flexion, standing  1. Stand and hold a broomstick, a cane, or a similar object. Place your hands a little more than shoulder width apart on the object. Your left / right hand should be palm up, and your other hand should be palm down. 2. Keep your elbow straight and your  shoulder muscles relaxed. Push the stick up with your healthy arm to raise your left / right arm in front of your body, and then over your head until you feel a stretch in your shoulder (flexion). ? Avoid shrugging your shoulder while you raise your arm. Keep your shoulder blade tucked down toward the middle of your back. 3. Hold for __________ seconds. 4. Slowly return to the starting position. Repeat __________ times. Complete this exercise __________ times  a day. Shoulder abduction, standing 1. Stand and hold a broomstick, a cane, or a similar object. Place your hands a little more than shoulder width apart on the object. Your left / right hand should be palm up, and your other hand should be palm down. 2. Keep your elbow straight and your shoulder muscles relaxed. Push the object across your body toward your left / right side. Raise your left / right arm to the side of your body (abduction) until you feel a stretch in your shoulder. ? Do not raise your arm above shoulder height unless your health care provider tells you to do that. ? If directed, raise your arm over your head. ? Avoid shrugging your shoulder while you raise your arm. Keep your shoulder blade tucked down toward the middle of your back. 3. Hold for __________ seconds. 4. Slowly return to the starting position. Repeat __________ times. Complete this exercise __________ times a day. Internal rotation  1. Place your left / right hand behind your back, palm up. 2. Use your other hand to dangle an exercise band, a towel, or a similar object over your shoulder. Grasp the band with your left / right hand so you are holding on to both ends. 3. Gently pull up on the band until you feel a stretch in the front of your left / right shoulder. The movement of your arm toward the center of your body is called internal rotation. ? Avoid shrugging your shoulder while you raise your arm. Keep your shoulder blade tucked down toward the middle of your back. 4. Hold for __________ seconds. 5. Release the stretch by letting go of the band and lowering your hands. Repeat __________ times. Complete this exercise __________ times a day. Strengthening exercises External rotation  1. Sit in a stable chair without armrests. 2. Secure an exercise band to a stable object at elbow height on your left / right side. 3. Place a soft object, such as a folded towel or a small pillow, between your left / right  upper arm and your body to move your elbow about 4 inches (10 cm) away from your side. 4. Hold the end of the exercise band so it is tight and there is no slack. 5. Keeping your elbow pressed against the soft object, slowly move your forearm out, away from your abdomen (external rotation). Keep your body steady so only your forearm moves. 6. Hold for __________ seconds. 7. Slowly return to the starting position. Repeat __________ times. Complete this exercise __________ times a day. Shoulder abduction  1. Sit in a stable chair without armrests, or stand up. 2. Hold a __________ weight in your left / right hand, or hold an exercise band with both hands. 3. Start with your arms straight down and your left / right palm facing in, toward your body. 4. Slowly lift your left / right hand out to your side (abduction). Do not lift your hand above shoulder height unless your health care provider tells you that this is safe. ?  Keep your arms straight. ? Avoid shrugging your shoulder while you do this movement. Keep your shoulder blade tucked down toward the middle of your back. 5. Hold for __________ seconds. 6. Slowly lower your arm, and return to the starting position. Repeat __________ times. Complete this exercise __________ times a day. Shoulder extension 1. Sit in a stable chair without armrests, or stand up. 2. Secure an exercise band to a stable object in front of you so it is at shoulder height. 3. Hold one end of the exercise band in each hand. Your palms should face each other. 4. Straighten your elbows and lift your hands up to shoulder height. 5. Step back, away from the secured end of the exercise band, until the band is tight and there is no slack. 6. Squeeze your shoulder blades together as you pull your hands down to the sides of your thighs (extension). Stop when your hands are straight down by your sides. Do not let your hands go behind your body. 7. Hold for __________  seconds. 8. Slowly return to the starting position. Repeat __________ times. Complete this exercise __________ times a day. Shoulder row 1. Sit in a stable chair without armrests, or stand up. 2. Secure an exercise band to a stable object in front of you so it is at waist height. 3. Hold one end of the exercise band in each hand. Position your palms so that your thumbs are facing the ceiling (neutral position). 4. Bend each of your elbows to a 90-degree angle (right angle) and keep your upper arms at your sides. 5. Step back until the band is tight and there is no slack. 6. Slowly pull your elbows back behind you. 7. Hold for __________ seconds. 8. Slowly return to the starting position. Repeat __________ times. Complete this exercise __________ times a day. Shoulder press-ups  1. Sit in a stable chair that has armrests. Sit upright, with your feet flat on the floor. 2. Put your hands on the armrests so your elbows are bent and your fingers are pointing forward. Your hands should be about even with the sides of your body. 3. Push down on the armrests and use your arms to lift yourself off the chair. Straighten your elbows and lift yourself up as much as you comfortably can. ? Move your shoulder blades down, and avoid letting your shoulders move up toward your ears. ? Keep your feet on the ground. As you get stronger, your feet should support less of your body weight as you lift yourself up. 4. Hold for __________ seconds. 5. Slowly lower yourself back into the chair. Repeat __________ times. Complete this exercise __________ times a day. Wall push-ups  1. Stand so you are facing a stable wall. Your feet should be about one arm-length away from the wall. 2. Lean forward and place your palms on the wall at shoulder height. 3. Keep your feet flat on the floor as you bend your elbows and lean forward toward the wall. 4. Hold for __________ seconds. 5. Straighten your elbows to push yourself  back to the starting position. Repeat __________ times. Complete this exercise __________ times a day. This information is not intended to replace advice given to you by your health care provider. Make sure you discuss any questions you have with your health care provider. Document Revised: 10/14/2018 Document Reviewed: 07/22/2018 Elsevier Patient Education  [place].

## 2020-02-20 ENCOUNTER — Ambulatory Visit (INDEPENDENT_AMBULATORY_CARE_PROVIDER_SITE_OTHER): Payer: Commercial Managed Care - PPO | Admitting: Internal Medicine

## 2020-02-20 ENCOUNTER — Other Ambulatory Visit (INDEPENDENT_AMBULATORY_CARE_PROVIDER_SITE_OTHER): Payer: Commercial Managed Care - PPO

## 2020-02-20 ENCOUNTER — Encounter: Payer: Self-pay | Admitting: Internal Medicine

## 2020-02-20 VITALS — BP 160/76 | HR 68 | Ht 70.0 in | Wt 183.0 lb

## 2020-02-20 DIAGNOSIS — K529 Noninfective gastroenteritis and colitis, unspecified: Secondary | ICD-10-CM

## 2020-02-20 DIAGNOSIS — R159 Full incontinence of feces: Secondary | ICD-10-CM

## 2020-02-20 DIAGNOSIS — K624 Stenosis of anus and rectum: Secondary | ICD-10-CM

## 2020-02-20 DIAGNOSIS — K644 Residual hemorrhoidal skin tags: Secondary | ICD-10-CM

## 2020-02-20 DIAGNOSIS — L309 Dermatitis, unspecified: Secondary | ICD-10-CM

## 2020-02-20 LAB — COMPREHENSIVE METABOLIC PANEL
ALT: 39 U/L (ref 0–53)
AST: 20 U/L (ref 0–37)
Albumin: 4.3 g/dL (ref 3.5–5.2)
Alkaline Phosphatase: 61 U/L (ref 39–117)
BUN: 14 mg/dL (ref 6–23)
CO2: 29 mEq/L (ref 19–32)
Calcium: 9.5 mg/dL (ref 8.4–10.5)
Chloride: 101 mEq/L (ref 96–112)
Creatinine, Ser: 1.07 mg/dL (ref 0.40–1.50)
GFR: 70.52 mL/min (ref >60.00)
Glucose, Bld: 108 mg/dL — ABNORMAL HIGH (ref 70–99)
Potassium: 4 mEq/L (ref 3.5–5.1)
Sodium: 136 mEq/L (ref 135–145)
Total Bilirubin: 0.6 mg/dL (ref 0.2–1.2)
Total Protein: 7.4 g/dL (ref 6.0–8.3)

## 2020-02-20 LAB — CBC WITH DIFFERENTIAL/PLATELET
Basophils Absolute: 0.1 10*3/uL (ref 0.0–0.1)
Basophils Relative: 0.5 % (ref 0.0–3.0)
Eosinophils Absolute: 0.2 10*3/uL (ref 0.0–0.7)
Eosinophils Relative: 1.3 % (ref 0.0–5.0)
HCT: 45.4 % (ref 39.0–52.0)
Hemoglobin: 15.3 g/dL (ref 13.0–17.0)
Lymphocytes Relative: 11.5 % — ABNORMAL LOW (ref 12.0–46.0)
Lymphs Abs: 1.6 10*3/uL (ref 0.7–4.0)
MCHC: 33.8 g/dL (ref 30.0–36.0)
MCV: 87.3 fl (ref 78.0–100.0)
Monocytes Absolute: 1 10*3/uL (ref 0.1–1.0)
Monocytes Relative: 7.2 % (ref 3.0–12.0)
Neutro Abs: 11.3 10*3/uL — ABNORMAL HIGH (ref 1.4–7.7)
Neutrophils Relative %: 79.5 % — ABNORMAL HIGH (ref 43.0–77.0)
Platelets: 164 10*3/uL (ref 150.0–400.0)
RBC: 5.2 Mil/uL (ref 4.22–5.81)
RDW: 13.3 % (ref 11.5–15.5)
WBC: 14.2 10*3/uL — ABNORMAL HIGH (ref 4.0–10.5)

## 2020-02-20 LAB — SEDIMENTATION RATE: Sed Rate: 10 mm/hr (ref 0–20)

## 2020-02-20 LAB — C-REACTIVE PROTEIN: CRP: <1 mg/dL (ref 0.5–20.0)

## 2020-02-20 LAB — IGA: IgA: 343 mg/dL (ref 68–378)

## 2020-02-20 MED ORDER — NYSTATIN-TRIAMCINOLONE 100000-0.1 UNIT/GM-% EX OINT: 1.0000 "application " | TOPICAL_OINTMENT | Freq: Two times a day (BID) | CUTANEOUS | 1 refills | Status: DC

## 2020-02-20 NOTE — Patient Instructions (Signed)
Your provider has requested that you go to the basement level for lab work before leaving today. Press "B" on the elevator. The lab is located at the first door on the left as you exit the elevator.   Due to recent changes in healthcare laws, you may see the results of your imaging and laboratory studies on MyChart before your provider has had a chance to review them.  We understand that in some cases there may be results that are confusing or concerning to you. Not all laboratory results come back in the same time frame and the provider may be waiting for multiple results in order to interpret others.  Please give [place] 48 hours in order for your provider to thoroughly review all the results before contacting the office for clarification of your results.    After you finish the Mycolog cream use over the counter Vaseline or Desitin cream.   I appreciate the opportunity to care for you. Stan Head, MD, [facility]

## 2020-02-20 NOTE — Progress Notes (Signed)
Justin Pennington 58 y.o. 10-Sep-1960 [id number]  Assessment & Plan:   Encounter Diagnoses  Name Primary?  . Chronic diarrhea Yes  . Full incontinence of feces   . Acquired anal stenosis   . Anal skin tags   . Perianal dermatitis     I am going to evaluate him with the labs below.  Should fecal calprotectin or leukocytes be positive would consider a colonoscopy with ileoscopy.  He could have a low-grade Crohn's disease that is worsening.  I have not checked him for celiac disease before either and that is always possible.  I think he has anal skin tags and that along with anal stenosis is a little suggestive of Crohn's disease to me.  At 1 point in the past I had wondered whether these might not be condyloma and I think but might need to have that formally evaluated by surgery depending upon clinical course.  He has a fairly significant asymptomatic perianal dermatitis that I am going to treat with nystatin triamcinolone ointment and asked him to use a barrier cream after that.  Controlling his diarrhea should help improve that as well.  Imodium as needed at this point for now.  Something else to consider would be treatment with Xifaxan versus SIBO testing or both, Viberzi, bile acid binders depending upon results of this work-up.  Orders Placed This Encounter  Procedures  . Fecal leukocytes  . Calprotectin, Fecal  . CBC with Differential/Platelet  . Comprehensive metabolic panel  . C-reactive protein  . Sedimentation rate  . Tissue transglutaminase, IgA  . IgA    I appreciate the opportunity to care for this patient. CC: [name], FNP   Subjective:   Chief Complaint: Diarrhea, fecal urgency and incontinence  HPI Justin Pennington is a 59 year old white man known to me for prior screening colonoscopy tests x2, last in 2013, who has a history of chronic urgent defecation and loose stools.  Things seem to be worse in the last few months and has had at least several episodes of  incontinence which are quite problematic.  Typical days he will have a formed bowel movement and then have progressively looser stools perhaps 3 in the morning often another 1 in the afternoon and another in the early evening.  The symptoms are not nocturnal.  There is no bleeding.  Mild cramps but not much in the way of abdominal pain.  If he takes Imodium 1 or 2 this will help prevent problems and he often does that if he is traveling or going somewhere.  After doing that for a couple of days he may not have normal bowel movements or have less frequent defecation for a few days after that.  He does not seem to go without stools at all.  He had random colon biopsies in 2005.  These were normal.  He did have a single diminutive adenoma subcentimeter in 2005.  Next routine colonoscopy due 2023.  He has not been able to correlate diet changes to any great degree with symptoms i.e. he has restricted lactose artificial sweeteners etc. but salads will be a problem. Allergies  Allergen Reactions  . Crestor [Rosuvastatin]     Joint stiffness  . Lipitor [Atorvastatin Calcium] Other (See Comments)    Joint stiffness   Current Meds  Medication Sig  . albuterol (VENTOLIN HFA) 108 (90 Base) MCG/ACT inhaler INHALE 2 PUFFS BY MOUTH EVERY 6 HOURS AS NEEDED FOR WHEEZING OR SHORTNESS OF BREATH  . cetirizine (ZYRTEC) 10 MG  tablet Take 1 tablet (10 mg total) by mouth daily.  . finasteride (PROPECIA) 1 MG tablet Take 1 mg by mouth daily.  . fluticasone (FLONASE) 50 MCG/ACT nasal spray Place 2 sprays into both nostrils daily.  . Fluticasone-Salmeterol (ADVAIR DISKUS) 100-50 MCG/DOSE AEPB INHALE 1 PUFF THEN RINSE MOUTH, TWICE DAILY- MAINTENANCE  . predniSONE (STERAPRED UNI-PAK 21 TAB) 10 MG (21) TBPK tablet As directed x 6 days   Past Medical History:  Diagnosis Date  . Allergic conjunctivitis   . Chronic obstructive asthma, unspecified   . History of adenomatous polyp of colon 2005   Less than 1 cm  .  Hyperlipidemia   . IBS (irritable bowel syndrome)    Diarrhea predominant  . Insomnia, unspecified    Past Surgical History:  Procedure Laterality Date  . COLONOSCOPY    . GINGIVAL GRAFT    . 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   Occasional alcohol, never smoker no drug use no tobacco   family history includes Asthma in his brother; Cancer in his father; Colon cancer (age of onset: 75) in his maternal aunt; Diabetes in his mother.   Review of Systems As per HPI  Objective:   Physical Exam BP (!) 160/76   Pulse 68   Ht 5\' 10"  (1.778 m)   Wt 183 lb (83 kg)   BMI 26.26 kg/m  Well-developed well-nourished white man no acute distress Rectal exam shows multiple anal tags there is a perianal dermatitis with skin breakdown in the gluteal crease extending above the coccyx into the presacral area  Digital rectal exam reveals anal stenosis without significant tenderness     Data reviewed see HPI I have looked at his labs from 2020 he had a normal TSH then CBC was normal CMET normal.

## 2020-02-21 ENCOUNTER — Other Ambulatory Visit: Payer: Commercial Managed Care - PPO

## 2020-02-21 DIAGNOSIS — K529 Noninfective gastroenteritis and colitis, unspecified: Secondary | ICD-10-CM

## 2020-02-21 DIAGNOSIS — R159 Full incontinence of feces: Secondary | ICD-10-CM

## 2020-02-21 LAB — TISSUE TRANSGLUTAMINASE, IGA: (tTG) Ab, IgA: 2 U/mL

## 2020-02-27 ENCOUNTER — Other Ambulatory Visit: Payer: Commercial Managed Care - PPO

## 2020-02-27 DIAGNOSIS — R159 Full incontinence of feces: Secondary | ICD-10-CM

## 2020-02-27 DIAGNOSIS — K529 Noninfective gastroenteritis and colitis, unspecified: Secondary | ICD-10-CM

## 2020-02-27 NOTE — Progress Notes (Unsigned)
Fecal leukocyte

## 2020-02-28 LAB — CALPROTECTIN, FECAL

## 2020-02-29 LAB — FECAL LEUKOCYTES
Micro Number: 10864667
Result: NOT DETECTED
Specimen Quality: ADEQUATE

## 2020-03-01 LAB — CALPROTECTIN

## 2020-03-01 LAB — TEST AUTHORIZATION

## 2020-03-01 LAB — EXTRA SPECIMEN

## 2020-03-01 LAB — FECAL LEUKOCYTES

## 2020-03-04 ENCOUNTER — Other Ambulatory Visit: Payer: Self-pay

## 2020-03-04 ENCOUNTER — Other Ambulatory Visit: Payer: Commercial Managed Care - PPO

## 2020-03-04 DIAGNOSIS — K624 Stenosis of anus and rectum: Secondary | ICD-10-CM

## 2020-03-04 DIAGNOSIS — K529 Noninfective gastroenteritis and colitis, unspecified: Secondary | ICD-10-CM

## 2020-03-04 DIAGNOSIS — L309 Dermatitis, unspecified: Secondary | ICD-10-CM

## 2020-03-04 DIAGNOSIS — K644 Residual hemorrhoidal skin tags: Secondary | ICD-10-CM

## 2020-03-04 DIAGNOSIS — R159 Full incontinence of feces: Secondary | ICD-10-CM

## 2020-03-06 ENCOUNTER — Telehealth: Payer: Self-pay | Admitting: Internal Medicine

## 2020-03-06 NOTE — Telephone Encounter (Signed)
Patient notified that calprotectin is still pending.

## 2020-03-19 ENCOUNTER — Other Ambulatory Visit: Payer: Self-pay | Admitting: Internal Medicine

## 2020-03-19 ENCOUNTER — Telehealth: Payer: Self-pay | Admitting: Internal Medicine

## 2020-03-19 NOTE — Telephone Encounter (Signed)
Patient notified that he needs to return to provide a sample for the fecal calprotectin. He verbalized understanding and will come at his convenience.

## 2020-03-19 NOTE — Telephone Encounter (Signed)
Patient requesting lab results

## 2020-04-13 LAB — CALPROTECTIN: Calprotectin: 21 mcg/g

## 2020-04-26 NOTE — Telephone Encounter (Signed)
I spoke with [facility].  They do not have record of order, despite order fax confirmation from 10/11 at 14:59.  They requested I fax again.  .  Patient notified.  Orders and insurance faxed again.  Patient notified.

## 2020-04-26 NOTE — Telephone Encounter (Signed)
Pt called stating that he has not heard from [facility] regarding stool test. Pls call him.

## 2020-05-07 ENCOUNTER — Ambulatory Visit (INDEPENDENT_AMBULATORY_CARE_PROVIDER_SITE_OTHER): Payer: Commercial Managed Care - PPO | Admitting: Family

## 2020-05-07 ENCOUNTER — Encounter: Payer: Self-pay | Admitting: Family

## 2020-05-07 DIAGNOSIS — J4489 Other specified chronic obstructive pulmonary disease: Secondary | ICD-10-CM

## 2020-05-07 DIAGNOSIS — J449 Chronic obstructive pulmonary disease, unspecified: Secondary | ICD-10-CM | POA: Diagnosis not present

## 2020-05-07 MED ORDER — PREDNISONE 10 MG (21) PO TBPK: ORAL_TABLET | ORAL | 0 refills | Status: DC

## 2020-05-07 MED ORDER — AZITHROMYCIN 250 MG PO TABS: ORAL_TABLET | ORAL | 0 refills | Status: DC

## 2020-05-07 NOTE — Progress Notes (Signed)
Virtual Visit via telephone Note Due to COVID-19 pandemic this visit was conducted virtually. This visit type was conducted due to national recommendations for restrictions regarding the COVID-19 Pandemic (e.g. social distancing, sheltering in place) in an effort to limit this patient's exposure and mitigate transmission in our community. All issues noted in this document were discussed and addressed.  A physical exam was not performed with this format.  I connected with Justin Pennington on 05/07/20 at 9:34 AM  by telephone and verified that I am speaking with the correct person using two identifiers. Justin Pennington is currently located at work and no one is currently with him  during visit. The provider, Jannifer Rodney, FNP is located in their office at time of visit.  I discussed the limitations, risks, security and privacy concerns of performing an evaluation and management service by telephone and the availability of in person appointments. I also discussed with the patient that there may be a patient responsible charge related to this service. The patient expressed understanding and agreed to proceed.   History and Present Illness:  Sinusitis This is a new problem. The current episode started 1 to 4 weeks ago. The problem has been gradually worsening since onset. There has been no fever. His pain is at a severity of 1/10. The pain is mild. Associated symptoms include congestion, coughing, headaches, a hoarse voice, sinus pressure and sneezing. Pertinent negatives include no chills, ear pain, shortness of breath or sore throat. Past treatments include oral decongestants and acetaminophen. The treatment provided mild relief.      Review of Systems  Constitutional: Negative for chills.  HENT: Positive for congestion, hoarse voice, sinus pressure and sneezing. Negative for ear pain and sore throat.   Respiratory: Positive for cough. Negative for shortness of breath.   Neurological: Positive for  headaches.  All other systems reviewed and are negative.    Observations/Objective: No SOB or distress noted, hoarse voice  Assessment and Plan: 1. Chronic obstructive airway disease with asthma (HCC) Encouraged to be COVID tested, wants to hold off - Start zyrtec and flonase every day - Use a cool mist humidifier  -Use saline nose sprays frequently -Force fluids -For any cough or congestion  Use plain Mucinex- regular strength or max strength is fine -For fever or aces or pains- take tylenol or ibuprofen. -Throat lozenges if help -Call if symptoms worsen or do not improve  - predniSONE (STERAPRED UNI-PAK 21 TAB) 10 MG (21) TBPK tablet; Use as directed  Dispense: 21 tablet; Refill: 0 - azithromycin (ZITHROMAX) 250 MG tablet; Take 500 mg once, then 250 mg for four days  Dispense: 6 tablet; Refill: 0     I discussed the assessment and treatment plan with the patient. The patient was provided an opportunity to ask questions and all were answered. The patient agreed with the plan and demonstrated an understanding of the instructions.   The patient was advised to call back or seek an in-person evaluation if the symptoms worsen or if the condition fails to improve as anticipated.  The above assessment and management plan was discussed with the patient. The patient verbalized understanding of and has agreed to the management plan. Patient is aware to call the clinic if symptoms persist or worsen. Patient is aware when to return to the clinic for a follow-up visit. Patient educated on when it is appropriate to go to the emergency department.   Time call ended: 9:45 AM    I provided 11 minutes of  non-face-to-face time during this encounter.    Evelina Dun, FNP

## 2020-05-09 ENCOUNTER — Ambulatory Visit: Payer: PRIVATE HEALTH INSURANCE | Admitting: Internal Medicine

## 2020-05-13 NOTE — Telephone Encounter (Signed)
Patient has still not heard from [facility].  He is provided the number to contact them directly so he can complete the breath test that is ordered.

## 2020-05-13 NOTE — Telephone Encounter (Signed)
Patient is calling to follow up still waiting on someone to call him for stool test says he has been trying to complete the for a long time now.

## 2020-07-10 NOTE — Progress Notes (Signed)
HPI male never smoker followed for chronic obstructive asthma ( a1AT MM, history of pneumonia at age 60 or 53 months) , allergic conjunctivitis, allergic rhinitis a1AT 09/05/10- MM wnl Office spirometry 08/24/2013-moderately severe obstruction. FVC 3.64/77%, FEV1 2.13/57%, FEV1/FVC 0.59, FEF 25-75% 1.23/33% FENO 10/27/16-  34 H PFT 04/11/09- Moderately severe obstruction, no resp to BD, no restriction, NL DLCO   FEV1/FVC 0.60 ----------------------------------------------------------------------------------------------------  05/10/2019- 60 year old male never smoker followed for chronic obstructive asthma  ( a1AT MM, history of pneumonia at age 87 or 98 months ), allergic conjunctivitis, allergic rhinitis Advair 100, pro-air HFA, Flonase -----38yr f/u for COPD. States his breathing has been great since last visit.  Doesn't notice dyspnea, cough or wheeze. Continues to work.  Wife being treated for breast cancer. He has had flu vax but waiting for guidance before getting shingles vaccine- to discuss with her oncologist.  Uses rescue inhaler each AM, then 1-2x/ week otherwise.  PFT 04/11/09- Moderately severe obstruction, no resp to BD, no restriction, NL DLCO   FEV1/FVC 0.60 Recent seasonal nasal stuffy and drainage. Flonase usually sufficient. CXR 03/23/18- IMPRESSION: 1. No acute cardiopulmonary abnormalities.  07/11/20- 60 year old male never smoker followed for chronic obstructive asthma  ( a1AT MM, history of pneumonia at age 2 or 34 months ), allergic conjunctivitis, allergic rhinitis complicated by IBS, BPH, Hyperlipidemia, Advair 100, pro-air HFA, Flonase, zyrtec Covid vax- 2 Moderna Flu vax- had ACT 19 ------Patient states that he had bronchitis about 6 weeks ago and was on Zpak and prednisone and it has resolved. Patient states that he has been having watery eyes due to allergies but it is worse than normal.  Mild residual shortness of breath. Using rescue hfa 1-3x/ day. Eyes water but  nose ok.   ROS-see HPI    + = positive Constitutional:   No-   weight loss, night sweats, fevers, chills, fatigue, lassitude. HEENT:   No-  headaches, difficulty swallowing, tooth/dental problems, sore throat,       No-  sneezing, itching, ear ache, nasal congestion, post nasal drip,  CV:  No-   chest pain, orthopnea, PND, swelling in lower extremities, anasarca,  dizziness, palpitations Resp: +shortness of breath with exertion or at rest.              No-   productive cough,  No non-productive cough,  No- coughing up of blood.              No-   change in color of mucus.  + wheezing.   Skin: No-   rash or lesions. GI:  No-   heartburn, indigestion, abdominal pain, nausea, GU: . MS:  No-   joint pain or swelling.  Neuro-     nothing unusual Psych:  No- change in mood or affect. No depression or anxiety.  No memory loss.  OBJ- Physical Exam General- Alert, Oriented, Affect-appropriate, Distress- none acute, fit-appearing Skin- rash-none, lesions- none, excoriation- none Lymphadenopathy- none Head- atraumatic            Eyes- Gross vision intact, PERRLA, conjunctivae and secretions clear            Ears- Hearing, canals-normal            Nose- Clear, no-Septal dev, mucus, polyps, erosion, perforation.             Throat- Mallampati II , mucosa clear , drainage- none, tonsils- atrophic, + hoarse Neck- flexible , trachea midline, no stridor , thyroid nl, carotid no bruit Chest - symmetrical  excursion , unlabored           Heart/CV- RRR , no murmur , no gallop  , no rub, nl s1 s2                           - JVD- none , edema- none, stasis changes- none, varices- none           Lung- clear to P&A/=+ diminished, wheeze- none, cough- none , dullness-none, rub- none           Chest wall-  Abd-  Br/ Gen/ Rectal- Not done, not indicated Extrem- cyanosis- none, clubbing, none, atrophy- none, strength- nl Neuro- grossly intact to observation

## 2020-07-11 ENCOUNTER — Encounter: Payer: Self-pay | Admitting: Internal Medicine

## 2020-07-11 ENCOUNTER — Other Ambulatory Visit: Payer: Self-pay

## 2020-07-11 ENCOUNTER — Ambulatory Visit: Payer: Commercial Managed Care - PPO | Admitting: Internal Medicine

## 2020-07-11 DIAGNOSIS — H1013 Acute atopic conjunctivitis, bilateral: Secondary | ICD-10-CM | POA: Diagnosis not present

## 2020-07-11 DIAGNOSIS — J309 Allergic rhinitis, unspecified: Secondary | ICD-10-CM

## 2020-07-11 DIAGNOSIS — J4489 Other specified chronic obstructive pulmonary disease: Secondary | ICD-10-CM

## 2020-07-11 DIAGNOSIS — J449 Chronic obstructive pulmonary disease, unspecified: Secondary | ICD-10-CM

## 2020-07-11 MED ORDER — BREZTRI AEROSPHERE 160-9-4.8 MCG/ACT IN AERO: 2.0000 | INHALATION_SPRAY | Freq: Two times a day (BID) | RESPIRATORY_TRACT | 0 refills | Status: DC

## 2020-07-11 NOTE — Patient Instructions (Addendum)
Sample x 2 Breztri inhaler    Inhale 2 puffs then rinse mouth, twice daily    Try this instead of Advair. When the samples run out, go back to Advair for comparison.  For your eyes- look for otc Zaditor or Alaway eye drops  Please call if we can help

## 2020-07-23 ENCOUNTER — Telehealth: Payer: Self-pay | Admitting: Internal Medicine

## 2020-07-23 NOTE — Telephone Encounter (Signed)
We are being told that monoclonal antibody therapies are in very short supply. The waiting list locally is hundreds of people, but only the very sickest are being brought in for treatment. For the usual covid patients now, best is to stay home, quarantine to prevent spreading. Treat symptoms with otc cold and flu remedies. Stay warm, hydrated and rested.  If get really sick, especially if real shortness of breath, weakness- then go to ER.

## 2020-07-23 NOTE — Telephone Encounter (Signed)
Called and spoke with pt letting him know the recs per Dr. Maple Hudson and he verbalized understanding.nothing further needed.

## 2020-07-23 NOTE — Telephone Encounter (Signed)
Spoke with pt, states that he started having s/s on Saturday, tested positive for C19 today.  S/s: low grade temp, fatigue, headache, prod cough with clear mucus, PND, sinus congestion. Notes if he is coughing he has some mild wheezing, cough usually clears this up.   Denies: dyspnea, chest tightness/pain, nausea, vomiting, diarrhea, night sweats, loss of taste/smell.  Pt has been taking Tylenol to help decrease fever, has been taking Breztri BID, has albuterol inhaler but has not felt the need for it yet.   Pt has received covid vaccines X2, flu vaccine. Wants to know if CY thinks he needs MAB or has any other recs.    CY please advise, thanks!

## 2020-08-25 NOTE — Assessment & Plan Note (Signed)
Increased lacrimation without a lot of itching. Plan- discussed otc eye drops- Alaway, zaditor

## 2020-08-25 NOTE — Assessment & Plan Note (Signed)
Resolving exacerbation.  Plan- try samples Breztri instead of Advair for comparison

## 2020-11-11 ENCOUNTER — Ambulatory Visit: Payer: Commercial Managed Care - PPO | Admitting: Internal Medicine

## 2020-11-11 ENCOUNTER — Encounter: Payer: Self-pay | Admitting: Internal Medicine

## 2020-11-11 ENCOUNTER — Other Ambulatory Visit: Payer: Self-pay

## 2020-11-11 ENCOUNTER — Telehealth: Payer: Self-pay | Admitting: Internal Medicine

## 2020-11-11 VITALS — BP 140/68 | HR 83 | Temp 98.3°F | Ht 70.0 in | Wt 188.6 lb

## 2020-11-11 DIAGNOSIS — J449 Chronic obstructive pulmonary disease, unspecified: Secondary | ICD-10-CM

## 2020-11-11 DIAGNOSIS — J4489 Other specified chronic obstructive pulmonary disease: Secondary | ICD-10-CM

## 2020-11-11 DIAGNOSIS — J3089 Other allergic rhinitis: Secondary | ICD-10-CM | POA: Diagnosis not present

## 2020-11-11 DIAGNOSIS — R0609 Other forms of dyspnea: Secondary | ICD-10-CM

## 2020-11-11 DIAGNOSIS — J302 Other seasonal allergic rhinitis: Secondary | ICD-10-CM

## 2020-11-11 DIAGNOSIS — R0600 Dyspnea, unspecified: Secondary | ICD-10-CM | POA: Diagnosis not present

## 2020-11-11 LAB — D-DIMER, QUANTITATIVE: D-Dimer, Quant: 0.27 mcg/mL FEU (ref <0.50)

## 2020-11-11 MED ORDER — TRELEGY ELLIPTA 100-62.5-25 MCG/INH IN AEPB: 1.0000 | INHALATION_SPRAY | Freq: Every day | RESPIRATORY_TRACT | 0 refills | Status: DC

## 2020-11-11 NOTE — Telephone Encounter (Signed)
Called and spoke with pt who states he has had worsening SOB x2 weeks now. States that he has had some wheezing. Denies any chest tightness.  Pt said that his breathing is worse when he is really active and also states after he has been lying down, when he goes to get up he notices that he is SOB.  Pt denies any complaints of fever.  States that he has had to use his rescue inhaler on a daily basis now using it about 4-5 times daily.  Asked pt if he was on Advair Diskus or if he was on Breztri as it looked like pt was given a sample at last OV and he said that he was on Advair. Asked pt if he was doing the inhaler twice a day and he said that he had only been using it once a day after conversation with CY at last OV.  Pt said that he was about to be going out of the country on 6/3 and was hoping to get some meds and relief prior to him leaving. Stated to pt that we needed to see him in office to further assess especially knowing that he was about to be going out of the country and since his symptoms have now been going on x2 weeks. Pt verbalized understanding and has been scheduled for an OV with  CY Wednesday, 5/11. Nothing further needed.

## 2020-11-11 NOTE — Patient Instructions (Signed)
Order- lab- CB w diff, D-dimer     Dx Dyspnea on exertion  Order- sample x 2 Trelegy 100 inhaler     Inhale 1 puff, once daily, then rinse mouth Try this instead of Advair- . If you prefer Advair, use it 1 puff, TWICE daily, at least until back from your trip.  If not feeling better after a few days on Trelegy, please let me know

## 2020-11-11 NOTE — Progress Notes (Signed)
HPI male never smoker followed for chronic obstructive asthma ( a1AT MM, history of pneumonia at age 60 or 36 months) , allergic conjunctivitis, allergic rhinitis a1AT 09/05/10- MM wnl Office spirometry 08/24/2013-moderately severe obstruction. FVC 3.64/77%, FEV1 2.13/57%, FEV1/FVC 0.59, FEF 25-75% 1.23/33% FENO 10/27/16-  34 H PFT 04/11/09- Moderately severe obstruction, no resp to BD, no restriction, NL DLCO   FEV1/FVC 0.60 ----------------------------------------------------------------------------------------------------  07/11/20- 60 year old male never smoker followed for chronic obstructive asthma  ( a1AT MM, history of pneumonia at age 80 or 3 months ), allergic conjunctivitis, allergic rhinitis complicated by IBS, BPH, Hyperlipidemia, Advair 100, pro-air HFA, Flonase, zyrtec Covid vax- 2 Moderna Flu vax- had ACT 19 ------Patient states that he had bronchitis about 6 weeks ago and was on Zpak and prednisone and it has resolved. Patient states that he has been having watery eyes due to allergies but it is worse than normal.  Mild residual shortness of breath. Using rescue hfa 1-3x/ day. Eyes water but nose ok.   11/11/20- 60 year old male never smoker followed for chronic obstructive asthma  ( a1AT MM, history of pneumonia at age 38 or 44 months ), allergic conjunctivitis, allergic rhinitis complicated by IBS, BPH, Hyperlipidemia, -Advair 100, pro-air HFA, Flonase, zyrtec Covid vax- 2 Moderna ------Patient states that he has had increased shortness of breath the last 2 weeks. Worse when he first wakes up and with exertion.  Gradual onset of dyspnea w/o cough, wheeze, discolored sputum or obvious infection. Using Advair only once daily.  ROS-see HPI    + = positive Constitutional:   No-   weight loss, night sweats, fevers, chills, fatigue, lassitude. HEENT:   No-  headaches, difficulty swallowing, tooth/dental problems, sore throat,       No-  sneezing, itching, ear ache, nasal congestion,  post nasal drip,  CV:  No-   chest pain, orthopnea, PND, swelling in lower extremities, anasarca,  dizziness, palpitations Resp: +shortness of breath with exertion or at rest.              No-   productive cough,  No non-productive cough,  No- coughing up of blood.              No-   change in color of mucus.  + wheezing.   Skin: No-   rash or lesions. GI:  No-   heartburn, indigestion, abdominal pain, nausea, GU: . MS:  No-   joint pain or swelling.  Neuro-     nothing unusual Psych:  No- change in mood or affect. No depression or anxiety.  No memory loss.  OBJ- Physical Exam General- Alert, Oriented, Affect-appropriate, Distress- none acute, fit-appearing Skin- rash-none, lesions- none, excoriation- none Lymphadenopathy- none Head- atraumatic            Eyes- Gross vision intact, PERRLA, conjunctivae and secretions clear            Ears- Hearing, canals-normal            Nose- Clear, no-Septal dev, mucus, polyps, erosion, perforation.             Throat- Mallampati II , mucosa clear , drainage- none, tonsils- atrophic, + hoarse Neck- flexible , trachea midline, no stridor , thyroid nl, carotid no bruit Chest - symmetrical excursion , unlabored           Heart/CV- RRR , no murmur , no gallop  , no rub, nl s1 s2                           -  JVD- none , edema- none, stasis changes- none, varices- none           Lung- clear to P&A/+ diminished, wheeze- none, cough- none , dullness-none, rub- none           Chest wall-  Abd-  Br/ Gen/ Rectal- Not done, not indicated Extrem- cyanosis- none, clubbing, none, atrophy- none, strength- nl Neuro- grossly intact to observation

## 2020-11-12 LAB — CBC WITH DIFFERENTIAL/PLATELET
Basophils Absolute: 0 10*3/uL (ref 0.0–0.1)
Basophils Relative: 0.4 % (ref 0.0–3.0)
Eosinophils Absolute: 0.4 10*3/uL (ref 0.0–0.7)
Eosinophils Relative: 4.4 % (ref 0.0–5.0)
HCT: 43.6 % (ref 39.0–52.0)
Hemoglobin: 14.8 g/dL (ref 13.0–17.0)
Lymphocytes Relative: 25.3 % (ref 12.0–46.0)
Lymphs Abs: 2.2 10*3/uL (ref 0.7–4.0)
MCHC: 34 g/dL (ref 30.0–36.0)
MCV: 85.6 fl (ref 78.0–100.0)
Monocytes Absolute: 0.7 10*3/uL (ref 0.1–1.0)
Monocytes Relative: 8.7 % (ref 3.0–12.0)
Neutro Abs: 5.3 10*3/uL (ref 1.4–7.7)
Neutrophils Relative %: 61.2 % (ref 43.0–77.0)
Platelets: 181 10*3/uL (ref 150.0–400.0)
RBC: 5.1 Mil/uL (ref 4.22–5.81)
RDW: 13.3 % (ref 11.5–15.5)
WBC: 8.6 10*3/uL (ref 4.0–10.5)

## 2020-11-13 ENCOUNTER — Ambulatory Visit: Payer: Commercial Managed Care - PPO | Admitting: Internal Medicine

## 2020-11-28 ENCOUNTER — Telehealth: Payer: Self-pay | Admitting: Internal Medicine

## 2020-11-28 MED ORDER — TRELEGY ELLIPTA 100-62.5-25 MCG/INH IN AEPB: 1.0000 | INHALATION_SPRAY | Freq: Every day | RESPIRATORY_TRACT | 5 refills | Status: DC

## 2020-11-28 NOTE — Telephone Encounter (Signed)
Patient returned triage call, name and birth date confirmed. Patient states he was calling to let Dr Maple Hudson know that the Trelegy he was given at his office visit on 11/11/20 is working well and Pt states that he would like the prescription for Trelegy to be sent to his pharmacy: [facility] in [place], [place]   Dr Maple Hudson please advise.

## 2020-11-28 NOTE — Telephone Encounter (Signed)
Please Rx Trelegy 100   # 1, inhale 1 puff then rinse mouth, once daily   ref x `12 Thaks

## 2020-11-28 NOTE — Telephone Encounter (Signed)
Called and spoke with Patient.  Dr.young's recommendations given. Trelegy prescription sent to requested pharmacy.  Nothing further at this time.

## 2020-11-28 NOTE — Telephone Encounter (Signed)
Called and left message on voicemail to please return phone call. Contact number provided. 

## 2020-12-12 ENCOUNTER — Other Ambulatory Visit: Payer: Self-pay | Admitting: Internal Medicine

## 2021-04-14 NOTE — Assessment & Plan Note (Signed)
Nonspecific exacerbation Plan- check D-dimer, CBC w diff, samples Trelegy for trial instead of Advair

## 2021-04-14 NOTE — Assessment & Plan Note (Signed)
He is not describing symptoms to suggest active seasonal allergy now, but watching for that.

## 2021-05-06 ENCOUNTER — Telehealth: Payer: Self-pay | Admitting: Internal Medicine

## 2021-05-06 MED ORDER — TRELEGY ELLIPTA 100-62.5-25 MCG/ACT IN AEPB: 1.0000 | INHALATION_SPRAY | Freq: Every day | RESPIRATORY_TRACT | 3 refills | Status: DC

## 2021-05-06 NOTE — Telephone Encounter (Signed)
Rx for Trelegy has been sent to preferred pharmacy for pt. Attempted to call pt to let him know this had been done but unable to reach. Left pt a detailed message letting him know that the Rx was sent in. Nothing further needed.

## 2021-05-20 ENCOUNTER — Telehealth: Payer: Self-pay | Admitting: Internal Medicine

## 2021-05-20 NOTE — Telephone Encounter (Signed)
Per pt's spouse, pt had tooth pain,had tooth pulled. Still had trouble,went to dentist was given tramadol-acetaminophen 37.5-325. Spouse read side effects and saw respiratory pt's should use caution and wanted CY to okay pt to take this. Please advise [phone]

## 2021-05-20 NOTE — Telephone Encounter (Signed)
Justin Pennington wife is checking on message. Patient phone number is [phone] or Justin Pennington phone number is [phone].

## 2021-05-20 NOTE — Telephone Encounter (Signed)
I have called and spoke with pt and he is aware.  Nothing further is needed.

## 2021-05-20 NOTE — Telephone Encounter (Signed)
I don't think Justin Pennington will have a problem with that medicine.  He needs to avoid being really heavily sedated, but I don't expect a problem.

## 2021-05-20 NOTE — Telephone Encounter (Signed)
CY please advise if medication is ok for the pt to take since he has respiratory issues.  They are concerned by the side effects of the tramadol/acetaminophen.  thanks

## 2021-06-05 DIAGNOSIS — M25562 Pain in left knee: Secondary | ICD-10-CM | POA: Insufficient documentation

## 2021-06-05 DIAGNOSIS — M25512 Pain in left shoulder: Secondary | ICD-10-CM | POA: Insufficient documentation

## 2021-06-18 ENCOUNTER — Other Ambulatory Visit: Payer: Self-pay

## 2021-06-18 ENCOUNTER — Ambulatory Visit: Payer: Commercial Managed Care - PPO | Attending: Physician Assistant

## 2021-06-18 DIAGNOSIS — G8929 Other chronic pain: Secondary | ICD-10-CM | POA: Insufficient documentation

## 2021-06-18 DIAGNOSIS — M25612 Stiffness of left shoulder, not elsewhere classified: Secondary | ICD-10-CM | POA: Insufficient documentation

## 2021-06-18 DIAGNOSIS — M25512 Pain in left shoulder: Secondary | ICD-10-CM | POA: Diagnosis present

## 2021-06-18 NOTE — Therapy (Addendum)
[facility] [address] [place], [place], [address] Phone: [phone]   Fax:  [phone]  Physical Therapy Evaluation  Patient Details  Name: Justin Pennington MRN: [id number] Date of Birth: 08-Apr-1961 Referring Provider (PT): Chabon   Encounter Date: 06/18/2021   PT End of Session - 06/18/21 1239     Visit Number 1    Number of Visits 10    Date for PT Re-Evaluation 09/12/21    PT Start Time 1240    PT Stop Time 1315    PT Time Calculation (min) 35 min    Activity Tolerance Patient tolerated treatment well    Behavior During Therapy [facility] for tasks assessed/performed             Past Medical History:  Diagnosis Date   Allergic conjunctivitis    Chronic obstructive asthma, unspecified    History of adenomatous polyp of colon 2005   Less than 1 cm   Hyperlipidemia    IBS (irritable bowel syndrome)    Diarrhea predominant   Insomnia, unspecified     Past Surgical History:  Procedure Laterality Date   COLONOSCOPY     GINGIVAL GRAFT     LEFT MENISCUS TEAR -ARTHROSCOPIC REPAIR      There were no vitals filed for this visit.    Subjective Assessment - 06/18/21 1239     Subjective Patient reports that his left shoulder has been bothering him for the past 4-5 months. He notes that he had an injection a few weeks ago, but it has not taken his pain completely away. His pain had stayed pretty steady prior to the injection.    Pertinent History COPD    Limitations Lifting    Patient Stated Goals decreased pain, more motion, and be able to work without aggravating his shoulder    Currently in Pain? Yes    Pain Score 2    7-8/10 prior to his injection   Pain Location Shoulder    Pain Orientation Left;Lateral    Pain Descriptors / Indicators Nagging;Other (Comment)   Bruise type pain   Pain Type Chronic pain    Pain Onset More than a month ago    Pain Frequency Constant    Aggravating Factors  reaching for his seatbelt,  rolling over onto the left shoulder    Pain Relieving Factors injection    Effect of Pain on Daily Activities he avoids lifting any heavy objects                [facility] PT Assessment - 06/18/21 0001       Assessment   Medical Diagnosis Adhesive capsulitis of the left shoulder    Referring Provider (PT) Chabon    Onset Date/Surgical Date --   4-5 months ago   Hand Dominance Right    Next MD Visit Mid January    Prior Therapy No      Precautions   Precautions None      Restrictions   Weight Bearing Restrictions No      Balance Screen   Has the patient fallen in the past 6 months No    Is the patient reluctant to leave their home because of a fear of falling?  No      Home [employer] information [employer] residence      Prior Function   Level of Independence Independent    Vocation Full time employment    Vocation Requirements pushing, pulling, lifting tires  Leisure Golf      Cognition   Overall Cognitive Status Within Functional Limits for tasks assessed    Attention Focused    Focused Attention Appears intact    Memory Appears intact    Awareness Appears intact    Problem Solving Appears intact      Sensation   Additional Comments Patient reports no numbness or tingling      ROM / Strength   AROM / PROM / Strength Strength;AROM      AROM   AROM Assessment Site Shoulder    Right/Left Shoulder Right;Left    Right Shoulder Flexion --   WFL   Right Shoulder ABduction --   [facility]   Right Shoulder Internal Rotation --   T7   Right Shoulder External Rotation --   T3   Left Shoulder Flexion 142 Degrees   slight pain   Left Shoulder ABduction 124 Degrees   lateral shoulder pain   Left Shoulder Internal Rotation --   T12; "very uncomfortable"   Left Shoulder External Rotation --   T4; "uncomfortable"     Strength   Strength Assessment Site Shoulder    Right/Left Shoulder Right;Left    Right Shoulder Flexion 4/5    Right Shoulder ABduction 4+/5     Right Shoulder Internal Rotation 4+/5    Right Shoulder External Rotation 4/5    Left Shoulder Flexion 4/5   discomfort   Left Shoulder ABduction 4+/5   discomfort   Left Shoulder Internal Rotation 4+/5   Pain   Left Shoulder External Rotation 4/5      Palpation   Palpation comment TTP: left long head of the bicep, upper trapezius   Glenohueral joint mobility: WFL and nonpainful                       Objective measurements completed on examination: See above findings.       [facility] Adult PT Treatment/Exercise - 06/18/21 0001       Exercises   Exercises Shoulder      Shoulder Exercises: Standing   ABduction AAROM;Left;5 reps   with cane     Shoulder Exercises: ROM/Strengthening   Wall Wash 7 reps; flexion      Shoulder Exercises: Stretch   Other Shoulder Stretches Upper trapezius stretch   holding chair                         PT Long Term Goals - 06/18/21 1336       PT LONG TERM GOAL #1   Title Patient will be independent with his HEP.    Time 5    Period Weeks    Status New    Target Date 07/23/21      PT LONG TERM GOAL #2   Title Patient will be able to demonstrate at least 150 degrees of active left shoulder flexion for improved function reaching overhead.    Time 5    Period Weeks    Status New    Target Date 07/23/21      PT LONG TERM GOAL #3   Title Patient will be able to demontrate at least 140 degrees of active left shoulder abduction for improved function reaching.    Time 5    Period Weeks    Status New    Target Date 07/23/21      PT LONG TERM GOAL #4   Title Patient will report minimal to no  difficulty with his critical job demands due to his left shoulder.    Time 5    Period Weeks    Status New    Target Date 07/23/21                    Plan - 06/18/21 1239     Clinical Impression Statement Patient is a 60 year old male presenting to physical therapy with left shoulder pain that began 4-5 months  ago with no known cause. He presented today with low pain severity and irritability. His pain was able to be reproduced with left shoulder AROM and palpation to the long head of the biceps. Active shoulder abduction and functional internal rotation were the most aggravating to his familiar symptoms. He continues to require skilled physical therapy to address his remaining impairments to return to his prior level of function.    Personal Factors and Comorbidities Time since onset of injury/illness/exacerbation;Comorbidity 1    Comorbidities COPD    Examination-Activity Limitations Reach Overhead;Lift    Examination-Participation Restrictions Occupation    Stability/Clinical Decision Making Stable/Uncomplicated    Clinical Decision Making Low    Rehab Potential Excellent    PT Frequency 2x / week    PT Duration Other (comment)   5 weeks   PT Treatment/Interventions Electrical Stimulation;Cryotherapy;Moist Heat;Therapeutic activities;Therapeutic exercise;Neuromuscular re-education;Manual techniques;Patient/family education;Passive range of motion;Dry needling;Vasopneumatic Device;Taping    PT Next Visit Plan FOTO, UBE, internal rotation stretch, shoulder strengthening, and modalities as needed    PT Home Exercise Plan Wall slides (flexion; 5 second holld; 2 minutes; 2x per day), AA abduction (5 second holld; 2 minutes; 2x per day), upper trap stretch (4 reps x 30 seconds; 2x per day)    Consulted and Agree with Plan of Care Patient             Patient will benefit from skilled therapeutic intervention in order to improve the following deficits and impairments:  Decreased range of motion, Impaired UE functional use, Decreased activity tolerance, Pain, Decreased strength  Visit Diagnosis: Stiffness of left shoulder, not elsewhere classified  Chronic left shoulder pain     Problem List Patient Active Problem List   Diagnosis Date Noted   Hyperlipemia 06/28/2013   BPH (benign prostatic  hyperplasia) 06/28/2013   Seasonal and perennial allergic rhinitis 09/05/2010   Allergic conjunctivitis and rhinitis 06/12/2007   Chronic obstructive airway disease with asthma (HCC) 06/12/2007   INSOMNIA UNSPECIFIED 06/12/2007   IRRITABLE BOWEL SYNDROME, HX OF 06/12/2007    Granville Lewis, PT 06/18/2021, 1:49 PM  [facility] [address] [place], [place], [address] Phone: [phone]   Fax:  [phone]  Name: Justin Pennington MRN: [id number] Date of Birth: 02/07/1961  PHYSICAL THERAPY DISCHARGE SUMMARY  Visits from Start of Care: 1  Current functional level related to goals / functional outcomes: Patient was unable to meet his goals for skilled physical therapy.    Remaining deficits: See evaluation    Education / Equipment: HEP    Patient agrees to discharge. Patient goals were not met. Patient is being discharged due to not returning since the last visit.  Candi Leash, PT, DPT

## 2021-06-24 ENCOUNTER — Encounter: Payer: Commercial Managed Care - PPO | Admitting: *Deleted

## 2021-06-26 ENCOUNTER — Encounter: Payer: Commercial Managed Care - PPO | Admitting: *Deleted

## 2021-07-08 ENCOUNTER — Ambulatory Visit: Payer: Commercial Managed Care - PPO | Admitting: *Deleted

## 2021-08-12 DIAGNOSIS — M7542 Impingement syndrome of left shoulder: Secondary | ICD-10-CM | POA: Insufficient documentation

## 2021-08-12 DIAGNOSIS — M222X2 Patellofemoral disorders, left knee: Secondary | ICD-10-CM | POA: Insufficient documentation

## 2021-08-12 DIAGNOSIS — M7502 Adhesive capsulitis of left shoulder: Secondary | ICD-10-CM | POA: Insufficient documentation

## 2021-08-21 ENCOUNTER — Ambulatory Visit: Payer: Commercial Managed Care - PPO | Admitting: Physician Assistant

## 2021-10-22 ENCOUNTER — Ambulatory Visit: Payer: Commercial Managed Care - PPO | Admitting: Cardiology

## 2021-11-12 ENCOUNTER — Ambulatory Visit: Payer: Commercial Managed Care - PPO | Admitting: Internal Medicine

## 2021-11-14 ENCOUNTER — Ambulatory Visit: Payer: Commercial Managed Care - PPO | Admitting: Internal Medicine

## 2021-11-18 ENCOUNTER — Ambulatory Visit: Payer: Commercial Managed Care - PPO | Admitting: Physician Assistant

## 2021-11-19 NOTE — Progress Notes (Signed)
HPI male never smoker followed for chronic obstructive asthma ( a1AT MM, history of pneumonia at age 62 or 71 months) , allergic conjunctivitis, allergic rhinitis a1AT 09/05/10- MM wnl Office spirometry 08/24/2013-moderately severe obstruction. FVC 3.64/77%, FEV1 2.13/57%, FEV1/FVC 0.59, FEF 25-75% 1.23/33% FENO 10/27/16-  34 H PFT 04/11/18- Moderately severe obstruction, no resp to BD, no restriction, NL DLCO   FEV1/FVC 0.60  ----------------------------------------------------------------------------------------------------    11/11/20- 61 year old male never smoker followed for chronic obstructive asthma  ( a1AT MM, history of pneumonia at age 79 or 69 months ), allergic conjunctivitis, allergic rhinitis complicated by IBS, BPH, Hyperlipidemia, -Advair 100, pro-air HFA, Flonase, zyrtec Covid vax- 2 Moderna ------Patient states that he has had increased shortness of breath the last 2 weeks. Worse when he first wakes up and with exertion.  Gradual onset of dyspnea w/o cough, wheeze, discolored sputum or obvious infection. Using Advair only once daily.  11/20/21- 61 year old male never smoker followed for Chronic Obstructive Asthma ( a1AT MM, history of pneumonia at age 71 or 63 months ), allergic conjunctivitis, allergic rhinitis complicated by IBS, BPH, Hyperlipidemia, PMR, -Advair 100, pro-air HFA, Flonase, zyrtec Covid vax- 2 Moderna   ----------------------------------//consider update CXR, PFT, a1AT//  Is currently on an extended Prednisone taper due to joint/muscle pain from Zetia medication which is no longer taking.                                           Prolonged prednisone therapy for PMR being managed by his rheumatologist. Says his breathing is "great" with no cough or wheeze.  ROS-see HPI    + = positive Constitutional:   No-   weight loss, night sweats, fevers, chills, fatigue, lassitude. HEENT:   No-  headaches, difficulty swallowing, tooth/dental problems, sore throat,        No-  sneezing, itching, ear ache, nasal congestion, post nasal drip,  CV:  No-   chest pain, orthopnea, PND, swelling in lower extremities, anasarca,  dizziness, palpitations Resp: +shortness of breath with exertion or at rest.              No-   productive cough,  No non-productive cough,  No- coughing up of blood.              No-   change in color of mucus.  + wheezing.   Skin: No-   rash or lesions. GI:  No-   heartburn, indigestion, abdominal pain, nausea, GU: . MS:  No-   joint pain or swelling.  Neuro-     nothing unusual Psych:  No- change in mood or affect. No depression or anxiety.  No memory loss.  OBJ- Physical Exam General- Alert, Oriented, Affect-appropriate, Distress- none acute, +Cushingoid Skin- rash-none, lesions- none, excoriation- none Lymphadenopathy- none Head- atraumatic            Eyes- Gross vision intact, PERRLA, conjunctivae and secretions clear            Ears- Hearing, canals-normal            Nose- Clear, no-Septal dev, mucus, polyps, erosion, perforation.             Throat- Mallampati II , mucosa clear , drainage- none, tonsils- atrophic,  Neck- flexible , trachea midline, no stridor , thyroid nl, carotid no bruit Chest - symmetrical excursion , unlabored  Heart/CV- RRR , no murmur , no gallop  , no rub, nl s1 s2                           - JVD- none , edema- none, stasis changes- none, varices- none           Lung- clear to P&A/+ diminished, wheeze- none, cough- none , dullness-none, rub- none           Chest wall-  Abd-  Br/ Gen/ Rectal- Not done, not indicated Extrem- cyanosis- none, clubbing, none, atrophy- none, strength- nl Neuro- grossly intact to observation

## 2021-11-21 ENCOUNTER — Ambulatory Visit (INDEPENDENT_AMBULATORY_CARE_PROVIDER_SITE_OTHER): Payer: Commercial Managed Care - PPO

## 2021-11-21 ENCOUNTER — Encounter: Payer: Self-pay | Admitting: Internal Medicine

## 2021-11-21 ENCOUNTER — Ambulatory Visit: Payer: Commercial Managed Care - PPO | Admitting: Internal Medicine

## 2021-11-21 VITALS — BP 140/70 | HR 68 | Temp 98.4°F | Ht 70.0 in | Wt 192.0 lb

## 2021-11-21 DIAGNOSIS — J449 Chronic obstructive pulmonary disease, unspecified: Secondary | ICD-10-CM

## 2021-11-21 DIAGNOSIS — J4489 Other specified chronic obstructive pulmonary disease: Secondary | ICD-10-CM

## 2021-11-21 DIAGNOSIS — M353 Polymyalgia rheumatica: Secondary | ICD-10-CM | POA: Diagnosis not present

## 2021-11-21 NOTE — Assessment & Plan Note (Signed)
He reports this is the working diagnosis of his Rheumatologist.  He continues prolonged steroid therapy.

## 2021-11-21 NOTE — Patient Instructions (Signed)
Order- schedule PFT     dx COPD mixed type  Order- CXR    dx COPD mixed type  Good luck with the PMR  Please call if we can help

## 2021-11-21 NOTE — Assessment & Plan Note (Signed)
He is very comfortable with his breathing currently.  We are going to reassess pulmonary function and update his alpha-1 level.  We can schedule PFT and get CXR now.  Alpha-1 antitrypsin is an acute phase reactant and I do not know how his PMR and prolonged steroid therapy will affect it so we will wait on that.

## 2021-12-09 NOTE — Progress Notes (Deleted)
Cardiology Office Note   Date:  12/09/2021   ID:  Markus Arminio, DOB 11/06/1960, MRN [id number]  PCP:  Sharion Balloon, FNP  Cardiologist:   None Referring:  ***  No chief complaint on file.     History of Present Illness: Justin Pennington is a 61 y.o. male who was referred by *** for evaluation of ***   Past Medical History:  Diagnosis Date   Allergic conjunctivitis    Chronic obstructive asthma, unspecified    History of adenomatous polyp of colon 2005   Less than 1 cm   Hyperlipidemia    IBS (irritable bowel syndrome)    Diarrhea predominant   Insomnia, unspecified     Past Surgical History:  Procedure Laterality Date   COLONOSCOPY     GINGIVAL GRAFT     LEFT MENISCUS TEAR -ARTHROSCOPIC REPAIR       Current Outpatient Medications  Medication Sig Dispense Refill   albuterol (VENTOLIN HFA) 108 (90 Base) MCG/ACT inhaler INHALE 2 PUFFS EVERY 6 HOURS AS NEEDED FOR WHEEZING OR SHORTNESS OF BREATH. 8.5 each 2   cetirizine (ZYRTEC) 10 MG tablet Take 1 tablet (10 mg total) by mouth daily. 30 tablet 11   finasteride (PROPECIA) 1 MG tablet Take 1 mg by mouth daily.     fluticasone (FLONASE) 50 MCG/ACT nasal spray Place 2 sprays into both nostrils daily. (Patient taking differently: Place 2 sprays into both nostrils as needed.) 16 g 6   Fluticasone-Umeclidin-Vilant (TRELEGY ELLIPTA) 100-62.5-25 MCG/ACT AEPB Inhale 1 puff into the lungs daily. 180 each 3   No current facility-administered medications for this visit.    Allergies:   Crestor [rosuvastatin] and Lipitor [atorvastatin calcium]    Social History:  The patient  reports that he has never smoked. He has never used smokeless tobacco. He reports current alcohol use. He reports that he does not use drugs.   Family History:  The patient's ***family history includes Asthma in his brother; Cancer in his father; Colon cancer (age of onset: 4) in his maternal aunt; Diabetes in his mother.    ROS:  Please see the history  of present illness.   Otherwise, review of systems are positive for {NONE DEFAULTED:18576}.   All other systems are reviewed and negative.    PHYSICAL EXAM: VS:  There were no vitals taken for this visit. , BMI There is no height or weight on file to calculate BMI. GENERAL:  Well appearing HEENT:  Pupils equal round and reactive, fundi not visualized, oral mucosa unremarkable NECK:  No jugular venous distention, waveform within normal limits, carotid upstroke brisk and symmetric, no bruits, no thyromegaly LYMPHATICS:  No cervical, inguinal adenopathy LUNGS:  Clear to auscultation bilaterally BACK:  No CVA tenderness CHEST:  Unremarkable HEART:  PMI not displaced or sustained,S1 and S2 within normal limits, no S3, no S4, no clicks, no rubs, *** murmurs ABD:  Flat, positive bowel sounds normal in frequency in pitch, no bruits, no rebound, no guarding, no midline pulsatile mass, no hepatomegaly, no splenomegaly EXT:  2 plus pulses throughout, no edema, no cyanosis no clubbing SKIN:  No rashes no nodules NEURO:  Cranial nerves II through XII grossly intact, motor grossly intact throughout PSYCH:  Cognitively intact, oriented to person place and time    EKG:  EKG {ACTION; IS/IS [id number] ordered today. The ekg ordered today demonstrates ***   Recent Labs: No results found for requested labs within last 8760 hours.    Lipid Panel  Component Value Date/Time   CHOL 206 (H) 04/06/2019 0906   TRIG 143 04/06/2019 0906   TRIG 138 06/28/2013 1040   HDL 38 (L) 04/06/2019 0906   HDL 47 06/28/2013 1040   CHOLHDL 5.4 (H) 04/06/2019 0906   LDLCALC 142 (H) 04/06/2019 0906   LDLCALC 104 (H) 06/28/2013 1040      Wt Readings from Last 3 Encounters:  11/21/21 192 lb (87.1 kg)  11/11/20 188 lb 9.6 oz (85.5 kg)  07/11/20 187 lb 8 oz (85 kg)      Other studies Reviewed: Additional studies/ records that were reviewed today include: ***. Review of the above records demonstrates:  Please  see elsewhere in the note.  ***   ASSESSMENT AND PLAN:  Dyslipidemia:  ***   Current medicines are reviewed at length with the patient today.  The patient {ACTIONS; HAS/DOES NOT HAVE:19233} concerns regarding medicines.  The following changes have been made:  {PLAN; NO CHANGE:13088:s}  Labs/ tests ordered today include: *** No orders of the defined types were placed in this encounter.    Disposition:   FU with ***    Signed, Minus Breeding, MD  12/09/2021 8:18 PM    [facility]

## 2021-12-10 ENCOUNTER — Ambulatory Visit: Payer: Commercial Managed Care - PPO | Admitting: Cardiology

## 2021-12-10 DIAGNOSIS — E785 Hyperlipidemia, unspecified: Secondary | ICD-10-CM

## 2022-01-12 DIAGNOSIS — R9431 Abnormal electrocardiogram [ECG] [EKG]: Secondary | ICD-10-CM | POA: Insufficient documentation

## 2022-01-12 DIAGNOSIS — R0602 Shortness of breath: Secondary | ICD-10-CM | POA: Insufficient documentation

## 2022-01-12 NOTE — Progress Notes (Unsigned)
Cardiology Office Note   Date:  01/14/2022   ID:  Justin Pennington, DOB 05-02-1961, MRN [id number]  PCP:  Justin Spencer, FNP  Cardiologist:   None Referring:  Justin Spencer, FNP  Chief Complaint  Patient presents with   Abnormal ECG      History of Present Illness: Justin Pennington is a 61 y.o. male who presents for evaluation of dyslipidemia.  He is referred by  Justin Spencer, FNP.  He has cardiovascular risk factors but he has not had any cardiac work-up previously.  He was noted to have dyslipidemia and was started on statin but muscle aches.  Did not tolerate Crestor or Lipitor.  His insurance would not cover PCSK9.  He was tried on Zetia and he got muscle aches have persisted.  He eventually went to a rheumatologist found to have PMR.  He is on a long steroid taper for that.  He has not had any cardiac symptoms.  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  This is heavy lifting and walking.The patient denies any new symptoms such as chest discomfort, neck or arm discomfort. There has been no new shortness of breath, PND or orthopnea. There have been no reported palpitations, presyncope or syncope.  He has not had any prior cardiac testing.   Past Medical History:  Diagnosis Date   Allergic conjunctivitis    Chronic obstructive asthma, unspecified    History of adenomatous polyp of colon 2005   Less than 1 cm   Hyperlipidemia    IBS (irritable bowel syndrome)    Diarrhea predominant   Insomnia, unspecified     Past Surgical History:  Procedure Laterality Date   COLONOSCOPY     GINGIVAL GRAFT     LEFT MENISCUS TEAR -ARTHROSCOPIC REPAIR       Current Outpatient Medications  Medication Sig Dispense Refill   albuterol (VENTOLIN HFA) 108 (90 Base) MCG/ACT inhaler INHALE 2 PUFFS EVERY 6 HOURS AS NEEDED FOR WHEEZING OR SHORTNESS OF BREATH. 8.5 each 2   finasteride (PROPECIA) 1 MG tablet Take 1 mg by mouth daily.      Fluticasone-Umeclidin-Vilant (TRELEGY ELLIPTA) 100-62.5-25 MCG/ACT AEPB Inhale 1 puff into the lungs daily. 180 each 3   cetirizine (ZYRTEC) 10 MG tablet Take 1 tablet (10 mg total) by mouth daily. (Patient not taking: Reported on 01/14/2022) 30 tablet 11   No current facility-administered medications for this visit.    Allergies:   Crestor [rosuvastatin], Lipitor [atorvastatin calcium], and Zetia [ezetimibe]    Social History:  The patient  reports that he has never smoked. He has never used smokeless tobacco. He reports current alcohol use. He reports that he does not use drugs.   Family History:  The patient's family history includes Asthma in his brother; Cancer in his father; Colon cancer (age of onset: 13) in his maternal aunt; Diabetes in his mother.    ROS:  Please see the history of present illness.   Otherwise, review of systems are positive for none.   All other systems are reviewed and negative.    PHYSICAL EXAM: VS:  BP (!) 160/80   Pulse 90   Ht 5\' 10"  (1.778 m)   Wt 190 lb (86.2 kg)   BMI 27.26 kg/m  , BMI Body mass index is 27.26 kg/m. GENERAL:  Well appearing HEENT:  Pupils equal round and reactive, fundi not visualized, oral mucosa unremarkable NECK:  No jugular venous  distention, waveform within normal limits, carotid upstroke brisk and symmetric, no bruits, no thyromegaly LYMPHATICS:  No cervical, inguinal adenopathy LUNGS:  Clear to auscultation bilaterally BACK:  No CVA tenderness CHEST:  Unremarkable HEART:  PMI not displaced or sustained,S1 and S2 within normal limits, no S3, no S4, no clicks, no rubs, no murmurs ABD:  Flat, positive bowel sounds normal in frequency in pitch, no bruits, no rebound, no guarding, no midline pulsatile mass, no hepatomegaly, no splenomegaly EXT:  2 plus pulses throughout, no edema, no cyanosis no clubbing SKIN:  No rashes no nodules NEURO:  Cranial nerves II through XII grossly intact, motor grossly intact throughout PSYCH:   Cognitively intact, oriented to person place and time    EKG:  EKG is ordered today. The ekg ordered today demonstrates sinus rhythm, rate 90, axis within normal limits, intervals within normal limits, poor anterior R wave progression, nonspecific T wave changes.   Recent Labs: No results found for requested labs within last 365 days.    Lipid Panel    Component Value Date/Time   CHOL 206 (H) 04/06/2019 0906   TRIG 143 04/06/2019 0906   TRIG 138 06/28/2013 1040   HDL 38 (L) 04/06/2019 0906   HDL 47 06/28/2013 1040   CHOLHDL 5.4 (H) 04/06/2019 0906   LDLCALC 142 (H) 04/06/2019 0906   LDLCALC 104 (H) 06/28/2013 1040      Wt Readings from Last 3 Encounters:  01/14/22 190 lb (86.2 kg)  11/21/21 192 lb (87.1 kg)  11/11/20 188 lb 9.6 oz (85.5 kg)      Other studies Reviewed: Additional studies/ records that were reviewed today include: Labs. Review of the above records demonstrates:  Please see elsewhere in the note.     ASSESSMENT AND PLAN:  Abnormal EKG: Has a slightly abnormal EKG.  This is nonspecific T wave changes.  This is not different from 2019.  He is not having any symptoms.  I am going to however screen him with a coronary calcium score to help [place] establish goals of therapy.  Dyslipidemia: His LDL was 142 with an HDL of 38.  Has been intolerant to statins although I might suggest pravastatin if he has an elevated coronary calcium score.  We talked about Mediterranean plant-based diet.  Hypertension: I told him the goals of therapy and asked him to keep a blood pressure diary 2 times a day for the next couple of weeks and I will review this.  We talked about weight loss which will be difficult with the steroids.  He likely will need low-dose antihypertensive.   Current medicines are reviewed at length with the patient today.  The patient does not have concerns regarding medicines.  The following changes have been made:  no change  Labs/ tests ordered today  include:   Orders Placed This Encounter  Procedures   CT CARDIAC SCORING (SELF PAY ONLY)   EKG 12-Lead     Disposition:   FU with me based on the results of the above   Signed, Justin Rotunda, MD  01/14/2022 4:26 PM    [facility]  '

## 2022-01-14 ENCOUNTER — Encounter: Payer: Self-pay | Admitting: Cardiology

## 2022-01-14 ENCOUNTER — Ambulatory Visit: Payer: Commercial Managed Care - PPO | Admitting: Cardiology

## 2022-01-14 VITALS — BP 160/80 | HR 90 | Ht 70.0 in | Wt 190.0 lb

## 2022-01-14 DIAGNOSIS — R9431 Abnormal electrocardiogram [ECG] [EKG]: Secondary | ICD-10-CM | POA: Diagnosis not present

## 2022-01-14 DIAGNOSIS — R0602 Shortness of breath: Secondary | ICD-10-CM | POA: Diagnosis not present

## 2022-01-14 NOTE — Patient Instructions (Signed)
Medication Instructions:  The current medical regimen is effective;  continue present plan and medications.  *If you need a refill on your cardiac medications before your next appointment, please call your pharmacy*  Testing/Procedures: Your physician has requested that you have a Coronary Calcium score which is completed by CT. Cardiac computed tomography (CT) is a painless test that uses an x-ray machine to take clear, detailed pictures of your heart. There are no instructions for this testing.  You may eat/drink and take your normal medications this day.  The cost of the testing is $99 due at the time of your appointment.   Follow-Up: At [facility], you and your health needs are our priority.  As part of our continuing mission to provide you with exceptional heart care, we have created designated Provider Care Teams.  These Care Teams include your primary Cardiologist (physician) and Advanced Practice Providers (APPs -  Physician Assistants and Nurse Practitioners) who all work together to provide you with the care you need, when you need it.  We recommend signing up for the patient portal called "MyChart".  Sign up information is provided on this After Visit Summary.  MyChart is used to connect with patients for Virtual Visits (Telemedicine).  Patients are able to view lab/test results, encounter notes, upcoming appointments, etc.  Non-urgent messages can be sent to your provider as well.   To learn more about what you can do with MyChart, go to [email or web address].     Follow up will be determine after the above testing has been completed.   Important Information About Sugar

## 2022-01-15 ENCOUNTER — Encounter (HOSPITAL_BASED_OUTPATIENT_CLINIC_OR_DEPARTMENT_OTHER): Payer: Self-pay

## 2022-01-22 ENCOUNTER — Ambulatory Visit: Payer: Commercial Managed Care - PPO | Admitting: Physician Assistant

## 2022-02-06 ENCOUNTER — Ambulatory Visit (HOSPITAL_BASED_OUTPATIENT_CLINIC_OR_DEPARTMENT_OTHER)
Admission: RE | Admit: 2022-02-06 | Discharge: 2022-02-06 | Disposition: A | Discharge status: Home / Self Care | Payer: Commercial Managed Care - PPO | Source: Ambulatory Visit | Attending: Cardiology | Admitting: Cardiology

## 2022-02-06 DIAGNOSIS — R0602 Shortness of breath: Secondary | ICD-10-CM

## 2022-02-06 DIAGNOSIS — R9431 Abnormal electrocardiogram [ECG] [EKG]: Secondary | ICD-10-CM

## 2022-02-10 DIAGNOSIS — I10 Essential (primary) hypertension: Secondary | ICD-10-CM | POA: Insufficient documentation

## 2022-02-10 DIAGNOSIS — R931 Abnormal findings on diagnostic imaging of heart and coronary circulation: Secondary | ICD-10-CM | POA: Insufficient documentation

## 2022-02-10 NOTE — Progress Notes (Unsigned)
Cardiology Office Note   Date:  02/11/2022   ID:  Justin Pennington, DOB 10/15/1960, MRN [id number]  PCP:  Justin Spencer, FNP  Cardiologist:   None Referring:  Justin Spencer, FNP  Chief Complaint  Patient presents with   Elevated coronary calcium      History of Present Illness: Justin Pennington is a 61 y.o. male who presents for evaluation of dyslipidemia.  He is referred by  Justin Spencer, FNP.  He had significant cardiovascular risk factors He has cardiovascular risk factors but he has not had any cardiac work-up previously.  I sent him for coronary calcium score.  This demonstrated a score total of 331 with predominance in the right coronary artery.  This was 92nd percentile for his age.  He is not having symptoms of chest discomfort or shortness of breath however.  He is a little bit limited by knee pain.   He is being managed with steroids right now for PMR.  I brought him back to discuss his results.  He does report he has no chest discomfort.  He does not have any shortness of breath, PND or orthopnea.  He denies any palpitations, presyncope or syncope.   Past Medical History:  Diagnosis Date   Allergic conjunctivitis    Chronic obstructive asthma, unspecified    History of adenomatous polyp of colon 2005   Less than 1 cm   Hyperlipidemia    IBS (irritable bowel syndrome)    Diarrhea predominant   Insomnia, unspecified     Past Surgical History:  Procedure Laterality Date   COLONOSCOPY     GINGIVAL GRAFT     LEFT MENISCUS TEAR -ARTHROSCOPIC REPAIR       Current Outpatient Medications  Medication Sig Dispense Refill   albuterol (VENTOLIN HFA) 108 (90 Base) MCG/ACT inhaler INHALE 2 PUFFS EVERY 6 HOURS AS NEEDED FOR WHEEZING OR SHORTNESS OF BREATH. 8.5 each 2   cetirizine (ZYRTEC) 10 MG tablet Take 1 tablet (10 mg total) by mouth daily. 30 tablet 11   finasteride (PROPECIA) 1 MG tablet Take 1 mg by mouth daily.     Fluticasone-Umeclidin-Vilant (TRELEGY ELLIPTA)  100-62.5-25 MCG/ACT AEPB Inhale 1 puff into the lungs daily. 180 each 3   predniSONE (DELTASONE) 1 MG tablet Take by mouth as directed.     predniSONE (DELTASONE) 5 MG tablet TAKE AS DIRECTED with ONE MG TABLET     No current facility-administered medications for this visit.    Allergies:   Crestor [rosuvastatin], Lipitor [atorvastatin calcium], and Zetia [ezetimibe]   ROS:  Please see the history of present illness.   Otherwise, review of systems are positive for none.   All other systems are reviewed and negative.    PHYSICAL EXAM: VS:  BP (!) 142/78   Pulse 80   Ht 5\' 10"  (1.778 m)   Wt 189 lb (85.7 kg)   BMI 27.12 kg/m  , BMI Body mass index is 27.12 kg/m. GENERAL:  Well appearing NECK:  No jugular venous distention, waveform within normal limits, carotid upstroke brisk and symmetric, no bruits, no thyromegaly LUNGS:  Clear to auscultation bilaterally CHEST:  Unremarkable HEART:  PMI not displaced or sustained,S1 and S2 within normal limits, no S3, no S4, no clicks, no rubs, no murmurs ABD:  Flat, positive bowel sounds normal in frequency in pitch, no bruits, no rebound, no guarding, no midline pulsatile mass, no hepatomegaly, no splenomegaly EXT:  2 plus pulses throughout, no edema, no cyanosis no  clubbing   EKG:  EKG is not ordered today.   Recent Labs: No results found for requested labs within last 365 days.    Lipid Panel    Component Value Date/Time   CHOL 206 (H) 04/06/2019 0906   TRIG 143 04/06/2019 0906   TRIG 138 06/28/2013 1040   HDL 38 (L) 04/06/2019 0906   HDL 47 06/28/2013 1040   CHOLHDL 5.4 (H) 04/06/2019 0906   LDLCALC 142 (H) 04/06/2019 0906   LDLCALC 104 (H) 06/28/2013 1040      Wt Readings from Last 3 Encounters:  02/11/22 189 lb (85.7 kg)  01/14/22 190 lb (86.2 kg)  11/21/21 192 lb (87.1 kg)      Other studies Reviewed: Additional studies/ records that were reviewed today include: CT and lipids. Review of the above records  demonstrates:  Please see elsewhere in the note.     ASSESSMENT AND PLAN:  Elevated coronary calcium: I am going to bring him back for POET (Plain Old Exercise Treadmill) to make sure there is no obstructive coronary disease.  Dyslipidemia: His LDL was 142 but he has been totally intolerant of statins.  Ongoing to bring him back and have been seen in [facility] to start PCSK9.   Hypertension: His blood pressure has been running a little bit high but we are going to wait until he is off the steroids and see if this does not bring him to target and he is also going to try for 5 pounds of weight loss.    Current medicines are reviewed at length with the patient today.  The patient does not have concerns regarding medicines.  The following changes have been made: As above  Labs/ tests ordered today include:     No orders of the defined types were placed in this encounter.    Disposition:   FU with me in about 4 month   Signed, Rollene Rotunda, MD  02/11/2022 1:50 PM    [facility]  '

## 2022-02-11 ENCOUNTER — Ambulatory Visit: Payer: Commercial Managed Care - PPO | Admitting: Cardiology

## 2022-02-11 ENCOUNTER — Encounter: Payer: Self-pay | Admitting: Cardiology

## 2022-02-11 VITALS — BP 142/78 | HR 80 | Ht 70.0 in | Wt 189.0 lb

## 2022-02-11 DIAGNOSIS — R931 Abnormal findings on diagnostic imaging of heart and coronary circulation: Secondary | ICD-10-CM

## 2022-02-11 DIAGNOSIS — E785 Hyperlipidemia, unspecified: Secondary | ICD-10-CM

## 2022-02-11 DIAGNOSIS — I10 Essential (primary) hypertension: Secondary | ICD-10-CM

## 2022-02-11 DIAGNOSIS — I2510 Atherosclerotic heart disease of native coronary artery without angina pectoris: Secondary | ICD-10-CM

## 2022-02-11 DIAGNOSIS — I2583 Coronary atherosclerosis due to lipid rich plaque: Secondary | ICD-10-CM

## 2022-02-11 NOTE — Patient Instructions (Signed)
Medication Instructions:  The current medical regimen is effective;  continue present plan and medications.  *If you need a refill on your cardiac medications before your next appointment, please call your pharmacy*  Testing/Procedures: Your physician has requested that you have an exercise tolerance test. For further information please visit [email or web address] Please also follow instruction sheet, as given.   Follow-Up: At [facility], you and your health needs are our priority.  As part of our continuing mission to provide you with exceptional heart care, we have created designated Provider Care Teams.  These Care Teams include your primary Cardiologist (physician) and Advanced Practice Providers (APPs -  Physician Assistants and Nurse Practitioners) who all work together to provide you with the care you need, when you need it.  We recommend signing up for the patient portal called "MyChart".  Sign up information is provided on this After Visit Summary.  MyChart is used to connect with patients for Virtual Visits (Telemedicine).  Patients are able to view lab/test results, encounter notes, upcoming appointments, etc.  Non-urgent messages can be sent to your provider as well.   To learn more about what you can do with MyChart, go to [email or web address].    Your next appointment:   4 month(s)  The format for your next appointment:   In Person  Provider:   Rollene Rotunda, MD{   Important Information About Sugar

## 2022-02-12 ENCOUNTER — Encounter (HOSPITAL_COMMUNITY): Payer: Commercial Managed Care - PPO

## 2022-02-20 ENCOUNTER — Encounter (HOSPITAL_COMMUNITY): Payer: Commercial Managed Care - PPO

## 2022-02-27 ENCOUNTER — Telehealth: Payer: Self-pay | Admitting: Cardiology

## 2022-02-27 DIAGNOSIS — I2510 Atherosclerotic heart disease of native coronary artery without angina pectoris: Secondary | ICD-10-CM

## 2022-02-27 DIAGNOSIS — R931 Abnormal findings on diagnostic imaging of heart and coronary circulation: Secondary | ICD-10-CM

## 2022-02-27 NOTE — Telephone Encounter (Signed)
  Pt said, he can't run on a treadmill and he is asking if stress test can be change to lexiscan

## 2022-02-27 NOTE — Telephone Encounter (Signed)
Spoke to patient, patient reports he is unable to walk on a treadmill long due to knee pain.  Requesting stress test be changed.   Routed to MD

## 2022-02-27 NOTE — Telephone Encounter (Signed)
Order changed-will have scheduling team reschedule.    Patient aware.

## 2022-03-06 ENCOUNTER — Inpatient Hospital Stay (HOSPITAL_COMMUNITY): Admission: RE | Admit: 2022-03-06 | Payer: Commercial Managed Care - PPO | Source: Ambulatory Visit

## 2022-03-06 ENCOUNTER — Ambulatory Visit: Payer: Commercial Managed Care - PPO

## 2022-03-06 ENCOUNTER — Ambulatory Visit (HOSPITAL_COMMUNITY)
Admission: RE | Admit: 2022-03-06 | Discharge: 2022-03-06 | Disposition: A | Discharge status: Home / Self Care | Payer: Commercial Managed Care - PPO | Source: Ambulatory Visit | Attending: Cardiology | Admitting: Cardiology

## 2022-03-06 DIAGNOSIS — R931 Abnormal findings on diagnostic imaging of heart and coronary circulation: Secondary | ICD-10-CM | POA: Insufficient documentation

## 2022-03-06 DIAGNOSIS — I2510 Atherosclerotic heart disease of native coronary artery without angina pectoris: Secondary | ICD-10-CM | POA: Insufficient documentation

## 2022-03-06 DIAGNOSIS — I2583 Coronary atherosclerosis due to lipid rich plaque: Secondary | ICD-10-CM | POA: Diagnosis not present

## 2022-03-06 LAB — MYOCARDIAL PERFUSION IMAGING
LV dias vol: 97 mL (ref 62–150)
LV sys vol: 33 mL
Nuc Stress EF: 66 %
Peak HR: 97 {beats}/min
Rest HR: 62 {beats}/min
Rest Nuclear Isotope Dose: 10.9 mCi
SDS: 0
SRS: 0
SSS: 0
ST Depression (mm): 0 mm
Stress Nuclear Isotope Dose: 31.5 mCi
TID: 1.05

## 2022-03-06 MED ORDER — REGADENOSON 0.4 MG/5ML IV SOLN
0.4000 mg | Freq: Once | INTRAVENOUS | Status: AC
  Administered 2022-03-06: 0.4 mg via INTRAVENOUS

## 2022-03-06 MED ORDER — TECHNETIUM TC 99M TETROFOSMIN IV KIT
10.9000 | PACK | Freq: Once | INTRAVENOUS | Status: AC | PRN
  Administered 2022-03-06: 10.9 via INTRAVENOUS

## 2022-03-06 MED ORDER — TECHNETIUM TC 99M TETROFOSMIN IV KIT
31.5000 | PACK | Freq: Once | INTRAVENOUS | Status: AC | PRN
  Administered 2022-03-06: 31.5 via INTRAVENOUS

## 2022-03-20 ENCOUNTER — Encounter: Payer: Self-pay | Admitting: Pharmacist

## 2022-03-20 ENCOUNTER — Ambulatory Visit: Payer: Commercial Managed Care - PPO | Attending: Internal Medicine | Admitting: Pharmacist

## 2022-03-20 DIAGNOSIS — I2510 Atherosclerotic heart disease of native coronary artery without angina pectoris: Secondary | ICD-10-CM

## 2022-03-20 DIAGNOSIS — I2583 Coronary atherosclerosis due to lipid rich plaque: Secondary | ICD-10-CM | POA: Diagnosis not present

## 2022-03-20 DIAGNOSIS — E785 Hyperlipidemia, unspecified: Secondary | ICD-10-CM

## 2022-03-20 DIAGNOSIS — M1712 Unilateral primary osteoarthritis, left knee: Secondary | ICD-10-CM | POA: Insufficient documentation

## 2022-03-20 NOTE — Patient Instructions (Signed)
It was nice meeting you today  We would like your LDL (bad cholesterol) to be less than 70  We can update your lipid panel today  The medication we recommend is called Repatha.  You would inject one pen once every 2 weeks  I will complete the prior authorization for you and contact you when it is approved  Once you start the medication we will recheck your cholesterol in about 2-3 months  Please call with any questions  Laural Golden, PharmD, BCACP, CDCES, CPP [address] [place], [place], [address] Phone: [phone], Fax: [phone]

## 2022-03-20 NOTE — Progress Notes (Signed)
Patient ID: Justin Pennington                 DOB: 05-10-1961                    MRN: [id number]     HPI: Justin Pennington is a 61 y.o. male patient referred to lipid clinic by Dr Antoine Poche. PMH is significant for CAD, elevated coronary calcium score, polymyalgia rheumatica on prednisone, and statin intolerance.  Patient presents today in good spirits. Records show last lipid panel performed in 2020 however patient says it was updated at another office last November. Can not find results. Patient believes TC actually increased from 2020.  Has had severe myalgia to atorvastatin and pitavastatin.  PCP placed him on Zetia last year which he believes caused the polymyalgia rheumatica.  Reports his   Current Medications:  N/A  Intolerances:  Atorvastatin Zetia Pitavastatin  Risk Factors:  CAD Elevated coronary calcium score  LDL goal: <70  Labs: TC 206, Trigs 143, HDL 38, LDL 142 (04/06/2019)  Coronary calcium score of 663. This was 92nd percentile for age-,race-, and sex-matched controls.  Past Medical History:  Diagnosis Date   Allergic conjunctivitis    Chronic obstructive asthma, unspecified    History of adenomatous polyp of colon 2005   Less than 1 cm   Hyperlipidemia    IBS (irritable bowel syndrome)    Diarrhea predominant   Insomnia, unspecified     Current Outpatient Medications on File Prior to Visit  Medication Sig Dispense Refill   albuterol (VENTOLIN HFA) 108 (90 Base) MCG/ACT inhaler INHALE 2 PUFFS EVERY 6 HOURS AS NEEDED FOR WHEEZING OR SHORTNESS OF BREATH. 8.5 each 2   cetirizine (ZYRTEC) 10 MG tablet Take 1 tablet (10 mg total) by mouth daily. 30 tablet 11   finasteride (PROPECIA) 1 MG tablet Take 1 mg by mouth daily.     Fluticasone-Umeclidin-Vilant (TRELEGY ELLIPTA) 100-62.5-25 MCG/ACT AEPB Inhale 1 puff into the lungs daily. 180 each 3   predniSONE (DELTASONE) 1 MG tablet Take by mouth as directed.     predniSONE (DELTASONE) 5 MG tablet TAKE AS DIRECTED with ONE MG  TABLET     No current facility-administered medications on file prior to visit.    Allergies  Allergen Reactions   Crestor [Rosuvastatin]     Joint stiffness   Lipitor [Atorvastatin Calcium] Other (See Comments)    Joint stiffness   Zetia [Ezetimibe] Other (See Comments)    "Muscle aches"    Assessment/Plan:  1. Hyperlipidemia - Patient last LDL in chart 142 but from 0865. This is over goal of <70.  Do not have results from last November. Patient is not fasting but will update lipid panel today since lipids will be rechecked regardless in 2-3 months after starting PCSK9i. If triglycerides are elevated, can treat at that time.  Discussed mechanism of action or Repatha. Counseled on storage, site selection, administration, and possible adverse effects. When lab results come back will complete PA and send to pharmacy. Recheck cholesterol panel in 2-3 months.  Check lipid panel Start Repatha 140mg  q 2 weeks Recheck lipid panel in 2-3 months  , PharmD, BCACP, CDCES, CPP [address] [place], [place], [place] Phone: [phone], Fax: [phone]

## 2022-03-21 LAB — LIPID PANEL
Chol/HDL Ratio: 5.8 ratio — ABNORMAL HIGH (ref 0.0–5.0)
Cholesterol, Total: 228 mg/dL — ABNORMAL HIGH (ref 100–199)
HDL: 39 mg/dL — ABNORMAL LOW (ref >39)
LDL Chol Calc (NIH): 130 mg/dL — ABNORMAL HIGH (ref 0–99)
Triglycerides: 329 mg/dL — ABNORMAL HIGH (ref 0–149)
VLDL Cholesterol Cal: 59 mg/dL — ABNORMAL HIGH (ref 5–40)

## 2022-03-24 DIAGNOSIS — M1712 Unilateral primary osteoarthritis, left knee: Secondary | ICD-10-CM | POA: Insufficient documentation

## 2022-03-25 ENCOUNTER — Telehealth: Payer: Self-pay | Admitting: Pharmacist

## 2022-03-25 DIAGNOSIS — E782 Mixed hyperlipidemia: Secondary | ICD-10-CM

## 2022-03-25 DIAGNOSIS — R931 Abnormal findings on diagnostic imaging of heart and coronary circulation: Secondary | ICD-10-CM

## 2022-03-25 NOTE — Telephone Encounter (Signed)
PA for Repatha submitted.  Key:  [id number]

## 2022-04-02 MED ORDER — REPATHA SURECLICK 140 MG/ML ~~LOC~~ SOAJ: 1.0000 mL | SUBCUTANEOUS | 3 refills | Status: AC

## 2022-04-02 NOTE — Addendum Note (Signed)
Addended by: Rollen Sox on: 04/02/2022 02:34 PM   Modules accepted: Orders

## 2022-04-02 NOTE — Telephone Encounter (Addendum)
PA approved through 04/02/23.  Patient is aware and will update lipid panel in 2-3 months

## 2022-05-08 ENCOUNTER — Telehealth: Payer: Self-pay | Admitting: Internal Medicine

## 2022-05-08 ENCOUNTER — Telehealth: Payer: Self-pay

## 2022-05-08 NOTE — Telephone Encounter (Signed)
   Pre-operative Risk Assessment    Patient Name: Justin Pennington  DOB: 04/02/1961 MRN: [id number]     Request for Surgical Clearance    Procedure:  Left total knee arthroplasty   Date of Surgery:  Clearance TBD                                 Surgeon:  Dr. Hart Robinsons Surgeon's Group or Practice Name:  [facility]  Phone number:  [phone] Fax number:  [phone]   Type of Clearance Requested:   - Medical    Type of Anesthesia:  Spinal   Additional requests/questions:    SignedMendel Ryder   05/08/2022, 11:27 AM

## 2022-05-08 NOTE — Telephone Encounter (Signed)
   Name: Justin Pennington  DOB: 1960/10/02  MRN: [id number]  Primary Cardiologist: None   Preoperative team, please contact this patient and set up a phone call appointment for further preoperative risk assessment. Please obtain consent and complete medication review. Thank you for your help.  I confirm that guidance regarding antiplatelet and oral anticoagulation therapy has been completed and, if necessary, noted below (none requested).    [name], NP 05/08/2022, 11:38 AM [place]

## 2022-05-08 NOTE — Telephone Encounter (Signed)
Will be on the look out for surgical clearance request. Nothing further needed at this time

## 2022-05-08 NOTE — Telephone Encounter (Signed)
Pt scheduled for a tele visit on 05/12/22. Med rec and consent done

## 2022-05-08 NOTE — Telephone Encounter (Signed)
  Patient Consent for Virtual Visit         Justin Pennington has provided verbal consent on 05/08/2022 for a virtual visit (video or telephone).   CONSENT FOR VIRTUAL VISIT FOR:  Justin Pennington  By participating in this virtual visit I agree to the following:  I hereby voluntarily request, consent and authorize [facility] and its employed or contracted physicians, physician assistants, nurse practitioners or other licensed health care professionals (the Practitioner), to provide me with telemedicine health care services (the "Services") as deemed necessary by the treating Practitioner. I acknowledge and consent to receive the Services by the Practitioner via telemedicine. I understand that the telemedicine visit will involve communicating with the Practitioner through live audiovisual communication technology and the disclosure of certain medical information by electronic transmission. I acknowledge that I have been given the opportunity to request an in-person assessment or other available alternative prior to the telemedicine visit and am voluntarily participating in the telemedicine visit.  I understand that I have the right to withhold or withdraw my consent to the use of telemedicine in the course of my care at any time, without affecting my right to future care or treatment, and that the Practitioner or I may terminate the telemedicine visit at any time. I understand that I have the right to inspect all information obtained and/or recorded in the course of the telemedicine visit and may receive copies of available information for a reasonable fee.  I understand that some of the potential risks of receiving the Services via telemedicine include:  Delay or interruption in medical evaluation due to technological equipment failure or disruption; Information transmitted may not be sufficient (e.g. poor resolution of images) to allow for appropriate medical decision making by the Practitioner;  and/or  In rare instances, security protocols could fail, causing a breach of personal health information.  Furthermore, I acknowledge that it is my responsibility to provide information about my medical history, conditions and care that is complete and accurate to the best of my ability. I acknowledge that Practitioner's advice, recommendations, and/or decision may be based on factors not within their control, such as incomplete or inaccurate data provided by me or distortions of diagnostic images or specimens that may result from electronic transmissions. I understand that the practice of medicine is not an exact science and that Practitioner makes no warranties or guarantees regarding treatment outcomes. I acknowledge that a copy of this consent can be made available to me via my patient portal ([facility]), or I can request a printed copy by calling the office of [facility].    I understand that my insurance will be billed for this visit.   I have read or had this consent read to me. I understand the contents of this consent, which adequately explains the benefits and risks of the Services being provided via telemedicine.  I have been provided ample opportunity to ask questions regarding this consent and the Services and have had my questions answered to my satisfaction. I give my informed consent for the services to be provided through the use of telemedicine in my medical care

## 2022-05-12 ENCOUNTER — Ambulatory Visit (INDEPENDENT_AMBULATORY_CARE_PROVIDER_SITE_OTHER): Payer: Commercial Managed Care - PPO | Admitting: Internal Medicine

## 2022-05-12 ENCOUNTER — Ambulatory Visit: Payer: Commercial Managed Care - PPO | Attending: Internal Medicine | Admitting: General Practice

## 2022-05-12 ENCOUNTER — Encounter: Payer: Self-pay | Admitting: Internal Medicine

## 2022-05-12 VITALS — BP 148/82 | HR 76 | Ht 70.0 in | Wt 183.0 lb

## 2022-05-12 DIAGNOSIS — J4489 Other specified chronic obstructive pulmonary disease: Secondary | ICD-10-CM | POA: Diagnosis not present

## 2022-05-12 DIAGNOSIS — Z01810 Encounter for preprocedural cardiovascular examination: Secondary | ICD-10-CM

## 2022-05-12 DIAGNOSIS — R931 Abnormal findings on diagnostic imaging of heart and coronary circulation: Secondary | ICD-10-CM

## 2022-05-12 DIAGNOSIS — M353 Polymyalgia rheumatica: Secondary | ICD-10-CM | POA: Diagnosis not present

## 2022-05-12 NOTE — Progress Notes (Signed)
Virtual Visit via Telephone Note   Because of [name] co-morbid illnesses, he is at least at moderate risk for complications without adequate follow up.  This format is felt to be most appropriate for this patient at this time.  The patient did not have access to video technology/had technical difficulties with video requiring transitioning to audio format only (telephone).  All issues noted in this document were discussed and addressed.  No physical exam could be performed with this format.  Please refer to the patient's chart for his consent to telehealth for [facility].  Evaluation Performed:  Preoperative cardiovascular risk assessment _____________   Date:  05/12/2022   Patient ID:  Justin Pennington, DOB 04/30/1961, MRN [id number] Patient Location:  Home Provider location:   Office  Primary Care Provider:  Sharion Balloon, FNP Primary Cardiologist:  Minus Breeding, MD  Chief Complaint / Patient Profile   61 y.o. y/o male with a h/o elevated coronary calcium score, asthma, HTN who is pending left total knee arthroplasty and presents today for telephonic preoperative cardiovascular risk assessment.  Past Medical History    Past Medical History:  Diagnosis Date   Allergic conjunctivitis    Chronic obstructive asthma, unspecified    History of adenomatous polyp of colon 2005   Less than 1 cm   Hyperlipidemia    IBS (irritable bowel syndrome)    Diarrhea predominant   Insomnia, unspecified    Past Surgical History:  Procedure Laterality Date   COLONOSCOPY     GINGIVAL GRAFT     LEFT MENISCUS TEAR -ARTHROSCOPIC REPAIR      Allergies  Allergies  Allergen Reactions   Crestor [Rosuvastatin]     Joint stiffness   Lipitor [Atorvastatin Calcium] Other (See Comments)    Joint stiffness   Zetia [Ezetimibe] Other (See Comments)    "Muscle aches"    History of Present Illness    Justin Pennington is a 61 y.o. male who presents via audio/video conferencing for a  telehealth visit today.  Pt was last seen in cardiology clinic on 02/11/2022 by Dr. Minus Breeding.  At that time Haven Foss was doing well .  The patient is now pending procedure as outlined above. Since his last visit, he remains stable from a cardiac standpoint  Today he denies chest pain, shortness of breath, lower extremity edema, fatigue, palpitations, melena, hematuria, hemoptysis, diaphoresis, weakness, presyncope, syncope, orthopnea, and PND.   Home Medications    Prior to Admission medications   Medication Sig Start Date End Date Taking? Authorizing Provider  albuterol (VENTOLIN HFA) 108 (90 Base) MCG/ACT inhaler INHALE 2 PUFFS EVERY 6 HOURS AS NEEDED FOR WHEEZING OR SHORTNESS OF BREATH. 12/12/20   Baird Lyons D, MD  cetirizine (ZYRTEC) 10 MG tablet Take 1 tablet (10 mg total) by mouth daily. 02/08/17   Hawks, Christy A, FNP  Evolocumab (REPATHA SURECLICK) 878 MG/ML SOAJ Inject 1 mL into the skin every 14 (fourteen) days. 04/02/22   Pavero, Harrell Gave, RPH  finasteride (PROPECIA) 1 MG tablet Take 1 mg by mouth daily. 12/14/19   [provider]  Fluticasone-Umeclidin-Vilant (TRELEGY ELLIPTA) 100-62.5-25 MCG/ACT AEPB Inhale 1 puff into the lungs daily. 05/06/21   Deneise Lever, MD  predniSONE (DELTASONE) 1 MG tablet Take by mouth as directed. 01/02/22   [provider]  predniSONE (DELTASONE) 5 MG tablet TAKE AS DIRECTED with ONE MG TABLET    [provider]    Physical Exam    Vital Signs:  [place]  Oki does not have vital signs available for review today.  Given telephonic nature of communication, physical exam is limited. AAOx3. NAD. Normal affect.  Speech and respirations are unlabored.  Accessory Clinical Findings    None  Assessment & Plan    1.  Preoperative Cardiovascular Risk Assessment: Left total knee arthroplasty, Dr. Valma Cava, [facility]      Primary Cardiologist: Rollene Rotunda, MD  Chart reviewed as part of  pre-operative protocol coverage. Given past medical history and time since last visit, based on ACC/AHA guidelines, Bray Vickerman would be at acceptable risk for the planned procedure without further cardiovascular testing.   Patient was advised that if he develops new symptoms prior to surgery to contact our office to arrange a follow-up appointment.  He verbalized understanding.  His RCRI is a class I risk, 0.4% risk of major cardiac event.  He is able to complete greater than 4 METS of physical activity  I will route this recommendation to the requesting party via Epic fax function and remove from pre-op pool.  Please call with questions.       Time:   Today, I have spent 5 minutes with the patient with telehealth technology discussing medical history, symptoms, and management plan.     Ronney Asters, NP  05/12/2022, 8:23 AM

## 2022-05-12 NOTE — Progress Notes (Unsigned)
HPI male never smoker followed for chronic obstructive asthma ( a1AT MM, history of pneumonia at age 61 or 65 months) , allergic conjunctivitis, allergic rhinitis a1AT 09/05/10- MM wnl Office spirometry 08/24/2013-moderately severe obstruction. FVC 3.64/77%, FEV1 2.13/57%, FEV1/FVC 0.59, FEF 25-75% 1.23/33% FENO 10/27/16-  34 H PFT 04/11/18- Moderately severe obstruction, no resp to BD, no restriction, NL DLCO   FEV1/FVC 0.60  ----------------------------------------------------------------------------------------------------    11/20/21- 61 year old male never smoker followed for Chronic Obstructive Asthma ( a1AT MM, history of pneumonia at age 47 or 17 months ), allergic conjunctivitis, allergic rhinitis complicated by IBS, BPH, Hyperlipidemia, PMR, -Advair 100, pro-air HFA, Flonase, zyrtec Covid vax- 2 Moderna   ----------------------------------//consider update CXR, PFT, a1AT//  Is currently on an extended Prednisone taper due to joint/muscle pain from Zetia medication which is no longer taking.                                           Prolonged prednisone therapy for PMR being managed by his rheumatologist. Says his breathing is "great" with no cough or wheeze.  05/12/22- 61 year old male never smoker followed for Chronic Obstructive Asthma ( a1AT MM, history of pneumonia at age 27 or 55 months ), allergic conjunctivitis, allergic rhinitis, CAD, complicated by IBS, BPH, Hyperlipidemia, PMR, -Advair 100, pro-air HFA, Flonase, zyrtec Covid vax- 2 Moderna  Flu vax- plans after surgery Preop clearance for L TKR/ Dr Thomasena Edis PFT ordered 11/21/21- scheduled for Nov 24 Cardiac Scoring CT 92%- Dr Antoine Poche Continues low-dose prednisone for polymyalgia rheumatica. He has been limited by left knee pain and looking forward to surgery.  Breathing has been no problem at all and he does not remember when he last used his rescue inhaler.  Denies cough, wheeze or phlegm.  No concerns identified. CXR  11/22/21- IMPRESSION: Stable chronic COPD/emphysema pattern. No interval change or superimposed acute process by plain radiography   ROS-see HPI    + = positive Constitutional:   No-   weight loss, night sweats, fevers, chills, fatigue, lassitude. HEENT:   No-  headaches, difficulty swallowing, tooth/dental problems, sore throat,       No-  sneezing, itching, ear ache, nasal congestion, post nasal drip,  CV:  No-   chest pain, orthopnea, PND, swelling in lower extremities, anasarca,  dizziness, palpitations Resp: +shortness of breath with exertion or at rest.              No-   productive cough,  No non-productive cough,  No- coughing up of blood.              No-   change in color of mucus.  + wheezing.   Skin: No-   rash or lesions. GI:  No-   heartburn, indigestion, abdominal pain, nausea, GU: . MS:  No-   joint pain or swelling.  Neuro-     nothing unusual Psych:  No- change in mood or affect. No depression or anxiety.  No memory loss.  OBJ- Physical Exam General- Alert, Oriented, Affect-appropriate, Distress- none acute,  Skin- rash-none, lesions- none, excoriation- none Lymphadenopathy- none Head- atraumatic            Eyes- Gross vision intact, PERRLA, conjunctivae and secretions clear            Ears- Hearing, canals-normal            Nose- Clear, no-Septal dev, mucus, polyps,  erosion, perforation.             Throat- Mallampati II , mucosa clear , drainage- none, tonsils- atrophic,  Neck- flexible , trachea midline, no stridor , thyroid nl, carotid no bruit Chest - symmetrical excursion , unlabored           Heart/CV- RRR , no murmur , no gallop  , no rub, nl s1 s2                           - JVD- none , edema- none, stasis changes- none, varices- none           Lung- clear to P&A/+ diminished, wheeze- none, cough- none , dullness-none, rub- none           Chest wall-  Abd-  Br/ Gen/ Rectal- Not done, not indicated Extrem- cyanosis- none, clubbing, none, atrophy- none,  strength- nl Neuro- grossly intact to observation

## 2022-05-12 NOTE — Patient Instructions (Signed)
Ok to reschedule your PFT depending on the surgery schedule.  Ok from pulmonary standpoint to go ahead with planned knee surgery.  Please call if we can help

## 2022-05-13 ENCOUNTER — Encounter: Payer: Self-pay | Admitting: Internal Medicine

## 2022-05-13 NOTE — Assessment & Plan Note (Signed)
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  Clinically very stable and well-controlled.  Current meds are appropriate. Plan-he is clear from pulmonary standpoint to proceed with planned knee replacement surgery

## 2022-05-13 NOTE — Assessment & Plan Note (Signed)
Being followed by cardiology for coronary disease

## 2022-05-13 NOTE — Assessment & Plan Note (Signed)
He continues maintenance low-dose prednisone

## 2022-05-18 DIAGNOSIS — M25662 Stiffness of left knee, not elsewhere classified: Secondary | ICD-10-CM | POA: Insufficient documentation

## 2022-05-24 ENCOUNTER — Emergency Department (HOSPITAL_BASED_OUTPATIENT_CLINIC_OR_DEPARTMENT_OTHER): Payer: Commercial Managed Care - PPO

## 2022-05-24 ENCOUNTER — Emergency Department (HOSPITAL_BASED_OUTPATIENT_CLINIC_OR_DEPARTMENT_OTHER)
Admission: EM | Admit: 2022-05-24 | Discharge: 2022-05-24 | Disposition: A | Discharge status: Home / Self Care | Payer: Commercial Managed Care - PPO | Attending: Emergency Medicine | Admitting: Emergency Medicine

## 2022-05-24 ENCOUNTER — Emergency Department (HOSPITAL_BASED_OUTPATIENT_CLINIC_OR_DEPARTMENT_OTHER): Payer: Commercial Managed Care - PPO | Admitting: Radiology

## 2022-05-24 DIAGNOSIS — J029 Acute pharyngitis, unspecified: Secondary | ICD-10-CM | POA: Diagnosis present

## 2022-05-24 DIAGNOSIS — H6123 Impacted cerumen, bilateral: Secondary | ICD-10-CM | POA: Insufficient documentation

## 2022-05-24 DIAGNOSIS — H60501 Unspecified acute noninfective otitis externa, right ear: Secondary | ICD-10-CM | POA: Diagnosis not present

## 2022-05-24 DIAGNOSIS — U071 COVID-19: Secondary | ICD-10-CM | POA: Insufficient documentation

## 2022-05-24 LAB — RESP PANEL BY RT-PCR (FLU A&B, COVID) ARPGX2
Influenza A by PCR: NEGATIVE
Influenza B by PCR: NEGATIVE
SARS Coronavirus 2 by RT PCR: POSITIVE — AB

## 2022-05-24 MED ORDER — AEROCHAMBER PLUS FLO-VU MEDIUM MISC
1.0000 | Freq: Once | Status: AC
  Administered 2022-05-24: 1
  Filled 2022-05-24: qty 1

## 2022-05-24 MED ORDER — ALBUTEROL SULFATE HFA 108 (90 BASE) MCG/ACT IN AERS
2.0000 | INHALATION_SPRAY | Freq: Once | RESPIRATORY_TRACT | Status: AC
  Administered 2022-05-24: 2 via RESPIRATORY_TRACT
  Filled 2022-05-24: qty 6.7

## 2022-05-24 MED ORDER — AMOXICILLIN 500 MG PO CAPS: 500.0000 mg | ORAL_CAPSULE | Freq: Three times a day (TID) | ORAL | 0 refills | Status: DC

## 2022-05-24 MED ORDER — NIRMATRELVIR/RITONAVIR (PAXLOVID)TABLET: 3.0000 | ORAL_TABLET | Freq: Two times a day (BID) | ORAL | 0 refills | Status: AC

## 2022-05-24 MED ORDER — NEOMYCIN-POLYMYXIN-HC 3.5-10000-1 OT SUSP: 4.0000 [drp] | Freq: Three times a day (TID) | OTIC | 0 refills | Status: DC

## 2022-05-24 NOTE — ED Triage Notes (Signed)
Pt took tylenol at approx 8am today.

## 2022-05-24 NOTE — ED Triage Notes (Signed)
Pt c/o productive cough and fever for past 2 days, along with right ear congestion. Took at home covid test yesterday - negative. Pt took tylenol and mucinex with some relief.

## 2022-05-24 NOTE — ED Notes (Signed)
Ambulated in the hallway with Justin Pennington on RA.  His lowest O2sat=95%

## 2022-05-24 NOTE — ED Provider Notes (Signed)
[facility] EMERGENCY DEPT Provider Note   CSN: [id number] Arrival date & time: 05/24/22  1212     History  Chief Complaint  Patient presents with   Cough   Fever    Justin Pennington is a 61 y.o. male.  Patient reports 2 days of productive cough, sinus congestion, runny nose, sore throat and right ear discomfort.  Took a COVID test at home yesterday which was negative.  Reports fever up to 101.  Has had productive cough, chills, sore throat, body aches, ear congestion and fever.  Sick contacts at home.  He does take prednisone chronically for rheumatoid condition is currently on 4 mg daily.  Denies any chest pain or shortness of breath.  Denies any abdominal pain, nausea vomiting or diarrhea.  Denies any chest pain.  No leg pain or leg swelling.  The history is provided by the patient.  Cough Associated symptoms: fever, headaches, myalgias and rhinorrhea   Associated symptoms: no chest pain   Fever Associated symptoms: congestion, cough, headaches, myalgias and rhinorrhea   Associated symptoms: no chest pain, no dysuria, no nausea and no vomiting        Home Medications Prior to Admission medications   Medication Sig Start Date End Date Taking? Authorizing Provider  albuterol (VENTOLIN HFA) 108 (90 Base) MCG/ACT inhaler INHALE 2 PUFFS EVERY 6 HOURS AS NEEDED FOR WHEEZING OR SHORTNESS OF BREATH. 12/12/20   Jetty Duhamel D, MD  cetirizine (ZYRTEC) 10 MG tablet Take 1 tablet (10 mg total) by mouth daily. 02/08/17   Hawks, Christy A, FNP  Evolocumab (REPATHA SURECLICK) 140 MG/ML SOAJ Inject 1 mL into the skin every 14 (fourteen) days. 04/02/22   Pavero, Cristal Deer, RPH  finasteride (PROPECIA) 1 MG tablet Take 1 mg by mouth daily. 12/14/19   [provider]  Fluticasone-Umeclidin-Vilant (TRELEGY ELLIPTA) 100-62.5-25 MCG/ACT AEPB Inhale 1 puff into the lungs daily. 05/06/21   Jetty Duhamel D, MD  predniSONE (DELTASONE) 5 MG tablet 1 mg.    [provider]       Allergies    Crestor [rosuvastatin], Lipitor [atorvastatin calcium], and Zetia [ezetimibe]    Review of Systems   Review of Systems  Constitutional:  Positive for activity change, appetite change and fever.  HENT:  Positive for congestion and rhinorrhea.   Respiratory:  Positive for cough.   Cardiovascular:  Negative for chest pain.  Gastrointestinal:  Negative for abdominal pain, nausea and vomiting.  Genitourinary:  Negative for dysuria and hematuria.  Musculoskeletal:  Positive for arthralgias and myalgias.  Skin:  Negative for wound.  Neurological:  Positive for weakness and headaches.   all other systems are negative except as noted in the HPI and PMH.    Physical Exam Updated Vital Signs BP (!) 141/74   Pulse 91   Temp 97.8 F (36.6 C)   Resp 18   SpO2 97%  Physical Exam Vitals and nursing note reviewed.  Constitutional:      General: He is not in acute distress.    Appearance: He is well-developed.  HENT:     Head: Normocephalic and atraumatic.     Ears:     Comments: Cerumen impaction bilaterally with some clear discharge from right ear canal    Mouth/Throat:     Pharynx: Posterior oropharyngeal erythema present. No oropharyngeal exudate.  Eyes:     Conjunctiva/sclera: Conjunctivae normal.     Pupils: Pupils are equal, round, and reactive to light.  Neck:     Comments: No meningismus.  Cardiovascular:     Rate and Rhythm: Normal rate and regular rhythm.     Heart sounds: Normal heart sounds. No murmur heard. Pulmonary:     Effort: Pulmonary effort is normal. No respiratory distress.     Breath sounds: Normal breath sounds.  Chest:     Chest wall: No tenderness.  Abdominal:     Palpations: Abdomen is soft.     Tenderness: There is no abdominal tenderness. There is no guarding or rebound.  Musculoskeletal:        General: No tenderness. Normal range of motion.     Cervical back: Normal range of motion and neck supple.  Skin:    General: Skin is warm.   Neurological:     Mental Status: He is alert and oriented to person, place, and time.     Cranial Nerves: No cranial nerve deficit.     Motor: No abnormal muscle tone.     Coordination: Coordination normal.     Comments: No ataxia on finger to nose bilaterally. No pronator drift. 5/5 strength throughout. CN 2-12 intact.Equal grip strength. Sensation intact.   Psychiatric:        Behavior: Behavior normal.     ED Results / Procedures / Treatments   Labs (all labs ordered are listed, but only abnormal results are displayed) Labs Reviewed  RESP PANEL BY RT-PCR (FLU A&B, COVID) ARPGX2 - Abnormal; Notable for the following components:      Result Value   SARS Coronavirus 2 by RT PCR POSITIVE (*)    All other components within normal limits    EKG None  Radiology No results found.  Procedures Procedures    Medications Ordered in ED Medications  albuterol (VENTOLIN HFA) 108 (90 Base) MCG/ACT inhaler 2 puff (has no administration in time range)    ED Course/ Medical Decision Making/ A&P                           Medical Decision Making Amount and/or Complexity of Data Reviewed Labs: ordered. Decision-making details documented in ED Course. Radiology: ordered and independent interpretation performed. Decision-making details documented in ED Course. ECG/medicine tests: ordered and independent interpretation performed. Decision-making details documented in ED Course.  Risk Prescription drug management.   2 days of sinus congestion, body aches, chills, cough and fever.  No hypoxia or increased work of breathing.  Seems to have cerumen impaction bilaterally.  Will irrigate ears.  COVID test is positive.  Chest x-ray is negative without evidence of infiltrate.  Results reviewed interpreted by me.  No hypoxia or increased work of breathing.  Patient is COVID-positive.  No hypoxia or increased work of breathing.  Ears were irrigated.  On reassessment he does have some  irritation to the right ear canal and will initiate on topical antibiotics for otitis externa.  Discussed quarantine precautions.  Discussed p.o. hydration, antipyretics and PCP follow-up.  Return to the ED with difficulty breathing, chest pain, or other concerns.  Discussed follow-up with ENT regarding your issues        Final Clinical Impression(s) / ED Diagnoses Final diagnoses:  COVID-19  Acute otitis externa of right ear, unspecified type    Rx / DC Orders ED Discharge Orders     None         Khristian Seals, Jeannett Senior, MD 05/24/22 1803

## 2022-05-24 NOTE — ED Notes (Signed)
Right ear flushed w/ sterile water, moderate amount of buildup flushed out. Pt states he feels relief.

## 2022-05-24 NOTE — ED Notes (Signed)
Discharge instructions, follow up care, and prescriptions reviewed and explained, pt verbalized understanding. Pt caox4, ambulatory, in  no obvious resp distress on d/c.

## 2022-05-24 NOTE — Discharge Instructions (Signed)
Your COVID test is positive.  Keep yourself quarantined for a total of 5 days.  Use Tylenol Motrin as needed for aches and for fever.  Follow-up with the ear nose and throat doctor regarding your ear discomfort and irritation.  Return to the ED with difficulty breathing, chest pain, shortness of breath, other concerns

## 2022-05-29 ENCOUNTER — Ambulatory Visit: Payer: Commercial Managed Care - PPO | Admitting: Internal Medicine

## 2022-06-02 ENCOUNTER — Other Ambulatory Visit: Payer: Self-pay | Admitting: Internal Medicine

## 2022-06-09 NOTE — Progress Notes (Deleted)
Cardiology Office Note   Date:  06/09/2022   ID:  Justin Pennington, DOB 06/26/1961, MRN [id number]  PCP:  [name], FNP  Cardiologist:   Rollene Rotunda, MD Referring:  [name], FNP  No chief complaint on file.     History of Present Illness: Justin Pennington is a 61 y.o. male who presents for evaluation of dyslipidemia.   He had elevated coronary calcium when I saw him.  He had a negative PET.    ***   ***   He is referred by  [name], FNP.  He had significant cardiovascular risk factors He has cardiovascular risk factors but he has not had any cardiac work-up previously.  I sent him for coronary calcium score.  This demonstrated a score total of 331 with predominance in the right coronary artery.  This was 92nd percentile for his age.  He is not having symptoms of chest discomfort or shortness of breath however.  He is a little bit limited by knee pain.   He is being managed with steroids right now for PMR.  I brought him back to discuss his results.  He does report he has no chest discomfort.  He does not have any shortness of breath, PND or orthopnea.  He denies any palpitations, presyncope or syncope.   Past Medical History:  Diagnosis Date   Allergic conjunctivitis    Chronic obstructive asthma, unspecified    History of adenomatous polyp of colon 2005   Less than 1 cm   Hyperlipidemia    IBS (irritable bowel syndrome)    Diarrhea predominant   Insomnia, unspecified     Past Surgical History:  Procedure Laterality Date   COLONOSCOPY     GINGIVAL GRAFT     LEFT MENISCUS TEAR -ARTHROSCOPIC REPAIR       Current Outpatient Medications  Medication Sig Dispense Refill   albuterol (VENTOLIN HFA) 108 (90 Base) MCG/ACT inhaler INHALE 2 PUFFS EVERY 6 HOURS AS NEEDED FOR WHEEZING OR SHORTNESS OF BREATH. 8.5 each 2   amoxicillin (AMOXIL) 500 MG capsule Take 1 capsule (500 mg total) by mouth 3 (three) times daily. 21 capsule 0   cetirizine (ZYRTEC) 10 MG  tablet Take 1 tablet (10 mg total) by mouth daily. 30 tablet 11   Evolocumab (REPATHA SURECLICK) 140 MG/ML SOAJ Inject 1 mL into the skin every 14 (fourteen) days. 6 mL 3   finasteride (PROPECIA) 1 MG tablet Take 1 mg by mouth daily.     neomycin-polymyxin-hydrocortisone (CORTISPORIN) 3.5-10000-1 OTIC suspension Place 4 drops into the right ear 3 (three) times daily. 7 mL 0   predniSONE (DELTASONE) 5 MG tablet 1 mg.     TRELEGY ELLIPTA 100-62.5-25 MCG/ACT AEPB TAKE 1 PUFF BY MOUTH EVERY DAY 180 each 3   No current facility-administered medications for this visit.    Allergies:   Crestor [rosuvastatin], Lipitor [atorvastatin calcium], and Zetia [ezetimibe]   ROS:  Please see the history of present illness.   Otherwise, review of systems are positive for ***.   All other systems are reviewed and negative.    PHYSICAL EXAM: VS:  There were no vitals taken for this visit. , BMI There is no height or weight on file to calculate BMI. GENERAL:  Well appearing NECK:  No jugular venous distention, waveform within normal limits, carotid upstroke brisk and symmetric, no bruits, no thyromegaly LUNGS:  Clear to auscultation bilaterally CHEST:  Unremarkable HEART:  PMI not displaced or  sustained,S1 and S2 within normal limits, no S3, no S4, no clicks, no rubs, *** murmurs ABD:  Flat, positive bowel sounds normal in frequency in pitch, no bruits, no rebound, no guarding, no midline pulsatile mass, no hepatomegaly, no splenomegaly EXT:  2 plus pulses throughout, no edema, no cyanosis no clubbing     ***GENERAL:  Well appearing NECK:  No jugular venous distention, waveform within normal limits, carotid upstroke brisk and symmetric, no bruits, no thyromegaly LUNGS:  Clear to auscultation bilaterally CHEST:  Unremarkable HEART:  PMI not displaced or sustained,S1 and S2 within normal limits, no S3, no S4, no clicks, no rubs, no murmurs ABD:  Flat, positive bowel sounds normal in frequency in pitch, no  bruits, no rebound, no guarding, no midline pulsatile mass, no hepatomegaly, no splenomegaly EXT:  2 plus pulses throughout, no edema, no cyanosis no clubbing   EKG:  EKG is *** ordered today. ***  Recent Labs: No results found for requested labs within last 365 days.    Lipid Panel    Component Value Date/Time   CHOL 228 (H) 03/20/2022 1455   TRIG 329 (H) 03/20/2022 1455   TRIG 138 06/28/2013 1040   HDL 39 (L) 03/20/2022 1455   HDL 47 06/28/2013 1040   CHOLHDL 5.8 (H) 03/20/2022 1455   LDLCALC 130 (H) 03/20/2022 1455   LDLCALC 104 (H) 06/28/2013 1040      Wt Readings from Last 3 Encounters:  05/12/22 183 lb (83 kg)  03/06/22 189 lb (85.7 kg)  02/11/22 189 lb (85.7 kg)      Other studies Reviewed: Additional studies/ records that were reviewed today include: *** Review of the above records demonstrates:  Please see elsewhere in the note.     ASSESSMENT AND PLAN:  Elevated coronary calcium:   He had a negative POET (Plain Old Exercise Treadmill).  ***  I am going to bring him back for POET (Plain Old Exercise Treadmill) to make sure there is no obstructive coronary disease.  Dyslipidemia: His LDL was *** 142 but he has been totally intolerant of statins.  Ongoing to bring him back and have been seen in the [facility] to start PCSK9.   Hypertension: His blood pressure is *** has been running a little bit high but we are going to wait until he is off the steroids and see if this does not bring him to target and he is also going to try for 5 pounds of weight loss.    Current medicines are reviewed at length with the patient today.  The patient does not have concerns regarding medicines.  The following changes have been made:  ***  Labs/ tests ordered today include:   ***  No orders of the defined types were placed in this encounter.    Disposition:   FU with me in *** month   Signed, Rollene Rotunda, MD  06/09/2022 8:55 PM    [facility]  '

## 2022-06-10 ENCOUNTER — Ambulatory Visit: Payer: Commercial Managed Care - PPO | Admitting: Cardiology

## 2022-06-10 DIAGNOSIS — E785 Hyperlipidemia, unspecified: Secondary | ICD-10-CM

## 2022-06-10 DIAGNOSIS — I10 Essential (primary) hypertension: Secondary | ICD-10-CM

## 2022-06-10 DIAGNOSIS — R931 Abnormal findings on diagnostic imaging of heart and coronary circulation: Secondary | ICD-10-CM

## 2022-07-01 ENCOUNTER — Encounter: Payer: Self-pay | Admitting: Internal Medicine

## 2022-07-15 NOTE — Progress Notes (Signed)
Cardiology Clinic Note   Patient Name: Justin Pennington Date of Encounter: 07/17/2022  Primary Care Provider:  Sharion Balloon, FNP Primary Cardiologist:  Minus Breeding, MD  Patient Profile     Justin Pennington presents to the clinic today for follow-up evaluation of his hypertension, coronary artery disease, and hyperlipidemia.  Past Medical History    Past Medical History:  Diagnosis Date   Allergic conjunctivitis    Chronic obstructive asthma, unspecified    History of adenomatous polyp of colon 2005   Less than 1 cm   Hyperlipidemia    IBS (irritable bowel syndrome)    Diarrhea predominant   Insomnia, unspecified    Past Surgical History:  Procedure Laterality Date   COLONOSCOPY     GINGIVAL GRAFT     LEFT MENISCUS TEAR -ARTHROSCOPIC REPAIR      Allergies  Allergies  Allergen Reactions   Crestor [Rosuvastatin]     Joint stiffness   Lipitor [Atorvastatin Calcium] Other (See Comments)    Joint stiffness   Zetia [Ezetimibe] Other (See Comments)    "Muscle aches"    History of Present Illness     Justin Pennington has a PMH of asthma, elevated coronary calcium score, asthma, hyperlipidemia, IBS, and insomnia.  He was initially seen by Dr. Percival Spanish after being referred from his PCP for evaluation of his elevated coronary calcium score.  He was noted to have a score of 331 with predominant plaque in his right coronary artery.  This placed him at the 92nd percentile for his age.  He denied having chest pain and shortness of breath.  He was somewhat limited in his physical activity due to knee pain.  He was seeing orthopedics.  He followed up with Dr. Percival Spanish 02/11/2022.  During that time he continued to do well from a cardiac standpoint.  He denied chest pain and shortness of breath.  He denied palpitations presyncope and syncope.  He underwent stress testing on 03/06/2022.  His study was normal and low risk.  No ST deviation was noted.  His EF was noted to be 66%.  He presents  to the clinic today for follow-up evaluation and states he feels well today.  He is planning left knee replacement on Thursday.  He is compliant with his Repatha.  We reviewed his September 23 lipid panel.  We also discussed his previous calcium score.  He reports that he had some cream in his coffee this morning.  I will have him return after his knee replacement surgery for fasting lab work.  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  We will continue his heart healthy low-sodium high-fiber diet, physical activity, and current medication.  Plan follow-up in 12 months.  Today he denies chest pain, shortness of breath, lower extremity edema, fatigue, palpitations, melena, hematuria, hemoptysis, diaphoresis, weakness, presyncope, syncope, orthopnea, and PND.     Home Medications    Prior to Admission medications   Medication Sig Start Date End Date Taking? Authorizing Provider  albuterol (VENTOLIN HFA) 108 (90 Base) MCG/ACT inhaler INHALE 2 PUFFS EVERY 6 HOURS AS NEEDED FOR WHEEZING OR SHORTNESS OF BREATH. 12/12/20   Baird Lyons D, MD  amoxicillin (AMOXIL) 500 MG capsule Take 1 capsule (500 mg total) by mouth 3 (three) times daily. 05/24/22   Rancour, Annie Main, MD  cetirizine (ZYRTEC) 10 MG tablet Take 1 tablet (10 mg total) by mouth daily. 02/08/17   Sharion Balloon, FNP  Evolocumab (REPATHA SURECLICK) 161 MG/ML SOAJ Inject 1 mL into the skin every 14 (fourteen) days. 04/02/22   Pavero, Harrell Gave, RPH  finasteride (PROPECIA) 1 MG tablet Take 1 mg by mouth daily. 12/14/19   [provider]  neomycin-polymyxin-hydrocortisone (CORTISPORIN) 3.5-10000-1 OTIC suspension Place 4 drops into the right ear 3 (three) times daily. 05/24/22   Rancour, Annie Main, MD  predniSONE (DELTASONE) 5 MG tablet 1 mg.    [provider]  Donnal Debar 100-62.5-25 MCG/ACT AEPB TAKE 1 PUFF BY MOUTH EVERY DAY 06/02/22   Deneise Lever, MD     Family History    Family History  Problem Relation Age of Onset   Cancer Father        BLADDER   Colon cancer Maternal Aunt 60   Diabetes Mother        borderline diabetes   Asthma Brother    Stomach cancer Neg Hx    He indicated that his mother is alive. He indicated that his father is deceased. He indicated that the status of his brother is unknown. He indicated that his maternal aunt is deceased. He indicated that the status of his neg hx is unknown.  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   Occasional alcohol, never smoker no drug use no tobacco   Social Determinants of Radio broadcast assistant Strain: Not on file  Food Insecurity: Not on file  Transportation Needs: Not on file  Physical Activity: Not on file  Stress: Not on file  Social Connections: Not on file  Intimate Partner Violence: Not on file     Review of Systems    General:  No chills, fever, night sweats or weight changes.  Cardiovascular:  No chest pain, dyspnea on exertion, edema, orthopnea, palpitations, paroxysmal nocturnal dyspnea. Dermatological: No rash, lesions/masses Respiratory: No cough, dyspnea Urologic: No hematuria, dysuria Abdominal:   No nausea, vomiting, diarrhea, bright red blood per rectum, melena, or hematemesis Neurologic:  No visual changes, wkns, changes in mental status. All other systems  reviewed and are otherwise negative except as noted above.  Physical Exam    VS:  BP 136/74   Pulse 74   Ht 5\' 10"  (1.778 m)   Wt 185 lb 3.2 oz (84 kg)   SpO2 97%   BMI 26.57 kg/m  , BMI Body mass index is 26.57 kg/m. GEN: Well nourished, well developed, in no acute distress. HEENT: normal. Neck: Supple, no JVD, carotid bruits, or masses. Cardiac: RRR, no murmurs, rubs, or gallops. No clubbing, cyanosis, edema.  Radials/DP/PT 2+ and equal bilaterally.  Respiratory:  Respirations regular and unlabored, clear to auscultation bilaterally. GI: Soft, nontender, nondistended, BS + x 4. MS: no deformity or atrophy. Skin: warm and dry, no rash. Neuro:  Strength  and sensation are intact. Psych: Normal affect.  Accessory Clinical Findings    Recent Labs: No results found for requested labs within last 365 days.   Recent Lipid Panel    Component Value Date/Time   CHOL 228 (H) 03/20/2022 1455   TRIG 329 (H) 03/20/2022 1455   TRIG 138 06/28/2013 1040   HDL 39 (L) 03/20/2022 1455   HDL 47 06/28/2013 1040   CHOLHDL 5.8 (H) 03/20/2022 1455   LDLCALC 130 (H) 03/20/2022 1455   LDLCALC 104 (H) 06/28/2013 1040         ECG personally reviewed by me today-normal sinus rhythm no ectopy 74 bpm- No acute changes  Nuclear stress test 03/06/2022    The study is normal. The study is low risk.   No ST deviation was noted.   LV perfusion is normal. There is no evidence of ischemia. There is no evidence of infarction.   Left ventricular function is normal. Nuclear stress EF: 66 %. The left ventricular ejection fraction is hyperdynamic (>65%). End diastolic cavity size is normal. End systolic cavity size is normal.   Prior study not available for comparison.   Normal resting and stress perfusion. No ischemia or infarction EF 66%  Assessment & Plan   1.  Elevated coronary calcium score, CAD-no chest pain today.  Underwent stress testing 03/06/2022 which showed low risk and no ischemia.   Previous coronary calcium score was 331 which placed him at the 92nd percentile for age and gender. Continue Repatha Heart healthy low-sodium diet-salty 6 given Increase physical activity as tolerated  Hyperlipidemia-LDL 130 on 03/20/22 Continue Repatha Heart healthy low-sodium high-fiber diet Increase physical activity as tolerated Repeat fasting lipids and LFT's  Essential hypertension-BP today 136/74.  Well-controlled at home. Increase physical activity as tolerated Secondary causes of hypertension reviewed Maintain blood pressure log  Disposition: Follow-up with Dr. Antoine Poche or me in 12 months.   Thomasene Ripple. Gianne Shugars NP-C     07/17/2022, 8:24 AM [facility] [address] Office [phone] Fax [phone]    I spent 14 minutes examining this patient, reviewing medications, and using patient centered shared decision making involving her cardiac care.  Prior to her visit I spent greater than 20 minutes reviewing her past medical history,  medications, and prior cardiac tests.

## 2022-07-17 ENCOUNTER — Ambulatory Visit: Payer: Commercial Managed Care - PPO | Attending: Cardiology | Admitting: General Practice

## 2022-07-17 ENCOUNTER — Encounter: Payer: Self-pay | Admitting: General Practice

## 2022-07-17 VITALS — BP 136/74 | HR 74 | Ht 70.0 in | Wt 185.2 lb

## 2022-07-17 DIAGNOSIS — E782 Mixed hyperlipidemia: Secondary | ICD-10-CM

## 2022-07-17 DIAGNOSIS — I10 Essential (primary) hypertension: Secondary | ICD-10-CM | POA: Diagnosis not present

## 2022-07-17 DIAGNOSIS — R931 Abnormal findings on diagnostic imaging of heart and coronary circulation: Secondary | ICD-10-CM | POA: Diagnosis not present

## 2022-07-17 NOTE — Patient Instructions (Addendum)
Medication Instructions:  Your physician recommends that you continue on your current medications as directed. Please refer to the Current Medication list given to you today.  *If you need a refill on your cardiac medications before your next appointment, please call your pharmacy*  Lab Work: Your physician recommends that you return for lab work in 3-6 weeks:  Fasting Lipid Panel-DO NOT eat or drink past midnight.  Hepatic (Liver) Function Panel  If you have labs (blood work) drawn today and your tests are completely normal, you will receive your results only by: MyChart Message (if you have MyChart) OR A paper copy in the mail If you have any lab test that is abnormal or we need to change your treatment, we will call you to review the results.  Testing/Procedures: NONE ordered at this time of appointment   Follow-Up: At [facility], you and your health needs are our priority.  As part of our continuing mission to provide you with exceptional heart care, we have created designated Provider Care Teams.  These Care Teams include your primary Cardiologist (physician) and Advanced Practice Providers (APPs -  Physician Assistants and Nurse Practitioners) who all work together to provide you with the care you need, when you need it.  We recommend signing up for the patient portal called "MyChart".  Sign up information is provided on this After Visit Summary.  MyChart is used to connect with patients for Virtual Visits (Telemedicine).  Patients are able to view lab/test results, encounter notes, upcoming appointments, etc.  Non-urgent messages can be sent to your provider as well.   To learn more about what you can do with MyChart, go to [email or web address].    Your next appointment:   1 year(s)  Provider:   Minus Breeding, MD     Other Instructions Maintain physical activity  Maintain high fiber diet

## 2022-07-29 ENCOUNTER — Ambulatory Visit: Payer: Commercial Managed Care - PPO | Attending: Specialist

## 2022-07-29 ENCOUNTER — Other Ambulatory Visit: Payer: Self-pay

## 2022-07-29 DIAGNOSIS — M25562 Pain in left knee: Secondary | ICD-10-CM | POA: Insufficient documentation

## 2022-07-29 DIAGNOSIS — M25662 Stiffness of left knee, not elsewhere classified: Secondary | ICD-10-CM | POA: Diagnosis present

## 2022-07-29 DIAGNOSIS — R262 Difficulty in walking, not elsewhere classified: Secondary | ICD-10-CM | POA: Diagnosis present

## 2022-07-29 NOTE — Therapy (Signed)
OUTPATIENT PHYSICAL THERAPY LOWER EXTREMITY EVALUATION   Patient Name: Justin Pennington MRN: [id number] DOB:1960-12-17, 61 y.o., male Today's Date: 07/29/2022  END OF SESSION:  PT End of Session - 07/29/22 0858     Visit Number 1    Number of Visits 12    Date for PT Re-Evaluation 08/28/22    PT Start Time 0901    PT Stop Time 0945    PT Time Calculation (min) 44 min    Activity Tolerance Patient tolerated treatment well    Behavior During Therapy WFL for tasks assessed/performed             Past Medical History:  Diagnosis Date   Allergic conjunctivitis    Chronic obstructive asthma, unspecified    History of adenomatous polyp of colon 2005   Less than 1 cm   Hyperlipidemia    IBS (irritable bowel syndrome)    Diarrhea predominant   Insomnia, unspecified    Past Surgical History:  Procedure Laterality Date   COLONOSCOPY     GINGIVAL GRAFT     LEFT MENISCUS TEAR -ARTHROSCOPIC REPAIR     Patient Active Problem List   Diagnosis Date Noted   Primary osteoarthritis of left knee 03/20/2022   Elevated coronary artery calcium score 02/10/2022   Essential hypertension 02/10/2022   Nonspecific abnormal electrocardiogram (ECG) (EKG) 01/12/2022   SOB (shortness of breath) 01/12/2022   Polymyalgia rheumatica ([employer]) 11/21/2021   Patellofemoral syndrome of left knee 08/12/2021   Impingement syndrome of left shoulder region 08/12/2021   Adhesive capsulitis of left shoulder 08/12/2021   Pain in joint of left shoulder 06/05/2021   Pain in joint of left knee 06/05/2021   Hyperlipemia 06/28/2013   BPH (benign prostatic hyperplasia) 06/28/2013   Seasonal and perennial allergic rhinitis 09/05/2010   Allergic conjunctivitis and rhinitis 06/12/2007   Chronic obstructive airway disease with asthma ([place]) 06/12/2007   INSOMNIA UNSPECIFIED 06/12/2007   IRRITABLE BOWEL SYNDROME, HX OF 06/12/2007   REFERRING PROVIDER: Sydnee Cabal, MD   REFERRING DIAG: Unilateral primary  osteoarthritis, left knee   THERAPY DIAG:  Acute pain of left knee  Stiffness of left knee, not elsewhere classified  Difficulty in walking, not elsewhere classified  Rationale for Evaluation and Treatment: Rehabilitation  ONSET DATE: 07/23/22  SUBJECTIVE:   SUBJECTIVE STATEMENT: Patient reports that he had a left knee replacement on 07/23/22. He notes that it has been hurting, but not more than expected. He has been using his ice machine to help with his pain. He has been doing his HEP at home about twice per day.   PERTINENT HISTORY: Hypertension, asthma, and osteoarthritis PAIN:  Are you having pain? Yes: NPRS scale: 15/10 Pain location: left knee Pain description: sharp, stabbing, quick, pulling Aggravating factors: sitting still Relieving factors: ice and medication  PRECAUTIONS: Knee  WEIGHT BEARING RESTRICTIONS: No  FALLS:  Has patient fallen in last 6 months? 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); stooping, kneeling, crawling, prolonged standing and walking, lifting and moving a truck tire  PLOF: Independent  PATIENT GOALS: return to work, play golf, reduced pain, and walk without an assistive device   NEXT MD VISIT: 08/07/22  OBJECTIVE:   PATIENT SURVEYS:  FOTO 40.01  COGNITION: Overall cognitive status: Within functional limits for tasks assessed     SENSATION: Patient reports no numbness  or tingling  PALPATION: TTP: left quadriceps   LOWER EXTREMITY ROM:  Active ROM Right eval Left eval  Hip flexion    Hip extension    Hip abduction    Hip adduction    Hip internal rotation    Hip external rotation    Knee flexion 138 80/87 (PROM)   Knee extension 0 27  Ankle dorsiflexion    Ankle plantarflexion    Ankle inversion    Ankle eversion     (Blank  rows = not tested)  LOWER EXTREMITY SPECIAL TESTS:  Not tested due to surgical condition  GAIT: Assistive device utilized: Environmental consultant - 2 wheeled Level of assistance: Modified independence Comments: Decreased gait speed, left knee flexed in stance phase, decreased stance time on left lower extremity, and decreased stride length   TODAY'S TREATMENT:                                                                                                                              DATE:                                     07/29/22 EXERCISE LOG  Exercise Repetitions and Resistance Comments  Quad set 10 reps w/ 5 second hold    Gastroc stretch  4 x 30 seconds                Blank cell = exercise not performed today    PATIENT EDUCATION:  Education details: Plan of care, HEP, prognosis, healing, swelling, anatomy, and goals for therapy Person educated: Patient and Spouse Education method: Explanation Education comprehension: verbalized understanding  HOME EXERCISE PROGRAM: Reviewed HEP provided by referring physician  ASSESSMENT:  CLINICAL IMPRESSION: Patient is a 62 y.o. male who was seen today for physical therapy evaluation and treatment following a left total knee arthroplasty on 07/23/22. He presented with moderate pain severity and irritability with left knee being the most aggravating his familiar knee pain. Pain was his primary limiting factor to his active and passive left knee range of motion. Recommend that he continue with skill physical therapy to address his impairments to return to his prior level of function.   OBJECTIVE IMPAIRMENTS: Abnormal gait, decreased activity tolerance, decreased mobility, difficulty walking, decreased ROM, decreased strength, hypomobility, increased edema, impaired flexibility, impaired tone, and pain.   ACTIVITY LIMITATIONS: carrying, lifting, standing, squatting, stairs, transfers, bed mobility, bathing, dressing, and locomotion level  PARTICIPATION  LIMITATIONS: meal prep, cleaning, laundry, driving, shopping, community activity, occupation, and yard work  PERSONAL FACTORS: Profession, Time since onset of injury/illness/exacerbation, and 3+ comorbidities: Hypertension, asthma, and osteoarthritis  are also affecting patient's functional outcome.   REHAB POTENTIAL: Good  CLINICAL DECISION MAKING: Stable/uncomplicated  EVALUATION COMPLEXITY: Low   GOALS: Goals reviewed with patient? Yes  LONG TERM GOALS: Target date: 08/26/22  Patient will be independent with his HEP.  Baseline:  Goal status: INITIAL  2.  Patient will be able to demonstrate at least 120 degrees of active left knee flexion for improved function navigating stairs.  Baseline:  Goal status: INITIAL  3.  Patient will be able to demonstrate active left knee extension within 5 degrees of neutral for improved gait mechanics.  Baseline:  Goal status: INITIAL  4.  Patient will be able to safely ambulate at least 80 feet without an assistive device for improved household mobility.  Baseline:  Goal status: INITIAL  5.  Patient will be able to navigate at least 3 steps with a reciprocal pattern for improved household and community mobility.  Baseline:  Goal status: INITIAL  PLAN:  PT FREQUENCY: 2-3x/week  PT DURATION: 4 weeks  PLANNED INTERVENTIONS: Therapeutic exercises, Therapeutic activity, Neuromuscular re-education, Balance training, Gait training, Patient/Family education, Self Care, Joint mobilization, Stair training, Electrical stimulation, Cryotherapy, Moist heat, Vasopneumatic device, Manual therapy, and Re-evaluation  PLAN FOR NEXT SESSION: nustep, focus on knee extension (quad set, LAQ, SLR, and TKE), manual therapy, and modalities as needed   Granville Lewis, PT 07/29/2022, 2:39 PM

## 2022-07-31 ENCOUNTER — Ambulatory Visit: Payer: Commercial Managed Care - PPO

## 2022-07-31 DIAGNOSIS — M25562 Pain in left knee: Secondary | ICD-10-CM

## 2022-07-31 DIAGNOSIS — M25662 Stiffness of left knee, not elsewhere classified: Secondary | ICD-10-CM

## 2022-07-31 DIAGNOSIS — R262 Difficulty in walking, not elsewhere classified: Secondary | ICD-10-CM

## 2022-07-31 NOTE — Therapy (Signed)
OUTPATIENT PHYSICAL THERAPY LOWER EXTREMITY TREATMENT   Patient Name: Justin Pennington MRN: [id number] DOB:1960-10-27, 62 y.o., male Today's Date: 07/31/2022  END OF SESSION:  PT End of Session - 07/31/22 0824     Visit Number 2    Number of Visits 12    Date for PT Re-Evaluation 08/28/22    PT Start Time 0815    PT Stop Time 0904    PT Time Calculation (min) 49 min    Activity Tolerance Patient tolerated treatment well    Behavior During Therapy WFL for tasks assessed/performed             Past Medical History:  Diagnosis Date   Allergic conjunctivitis    Chronic obstructive asthma, unspecified    History of adenomatous polyp of colon 2005   Less than 1 cm   Hyperlipidemia    IBS (irritable bowel syndrome)    Diarrhea predominant   Insomnia, unspecified    Past Surgical History:  Procedure Laterality Date   COLONOSCOPY     GINGIVAL GRAFT     LEFT MENISCUS TEAR -ARTHROSCOPIC REPAIR     Patient Active Problem List   Diagnosis Date Noted   Primary osteoarthritis of left knee 03/20/2022   Elevated coronary artery calcium score 02/10/2022   Essential hypertension 02/10/2022   Nonspecific abnormal electrocardiogram (ECG) (EKG) 01/12/2022   SOB (shortness of breath) 01/12/2022   Polymyalgia rheumatica ([facility]) 11/21/2021   Patellofemoral syndrome of left knee 08/12/2021   Impingement syndrome of left shoulder region 08/12/2021   Adhesive capsulitis of left shoulder 08/12/2021   Pain in joint of left shoulder 06/05/2021   Pain in joint of left knee 06/05/2021   Hyperlipemia 06/28/2013   BPH (benign prostatic hyperplasia) 06/28/2013   Seasonal and perennial allergic rhinitis 09/05/2010   Allergic conjunctivitis and rhinitis 06/12/2007   Chronic obstructive airway disease with asthma ([facility]) 06/12/2007   INSOMNIA UNSPECIFIED 06/12/2007   IRRITABLE BOWEL SYNDROME, HX OF 06/12/2007   REFERRING PROVIDER: Eugenia Mcalpine, MD   REFERRING DIAG: Unilateral primary  osteoarthritis, left knee   THERAPY DIAG:  Acute pain of left knee  Stiffness of left knee, not elsewhere classified  Difficulty in walking, not elsewhere classified  Rationale for Evaluation and Treatment: Rehabilitation  ONSET DATE: 07/23/22  SUBJECTIVE:   SUBJECTIVE STATEMENT: Patient reports that his knee is aching a lot this morning. He notes that it feels like it did the second day after surgery.   PERTINENT HISTORY: Hypertension, asthma, and osteoarthritis PAIN:  Are you having pain? Yes: NPRS scale: 9/10 Pain location: left knee Pain description: sharp, stabbing, quick, pulling Aggravating factors: sitting still Relieving factors: ice and medication  PRECAUTIONS: Knee  WEIGHT BEARING RESTRICTIONS: No  FALLS:  Has patient fallen in last 6 months? 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); stooping, kneeling, crawling, prolonged standing and walking, lifting and moving a truck tire  PLOF: Independent  PATIENT GOALS: return to work, play golf, reduced pain, and walk without an assistive device   NEXT MD VISIT: 08/07/22  OBJECTIVE: all objective assessments were completed at his initial evaluation on 07/29/22 unless otherwise noted  PATIENT SURVEYS:  FOTO 40.01  COGNITION: Overall cognitive status: Within functional limits for tasks assessed     SENSATION: Patient reports no numbness or tingling  PALPATION: TTP: left quadriceps   LOWER  EXTREMITY ROM:  Active ROM Right eval Left eval  Hip flexion    Hip extension    Hip abduction    Hip adduction    Hip internal rotation    Hip external rotation    Knee flexion 138 80/87 (PROM)   Knee extension 0 27  Ankle dorsiflexion    Ankle plantarflexion    Ankle inversion    Ankle eversion     (Blank rows = not  tested)  LOWER EXTREMITY SPECIAL TESTS:  Not tested due to surgical condition  GAIT: Assistive device utilized: Environmental consultant - 2 wheeled Level of assistance: Modified independence Comments: Decreased gait speed, left knee flexed in stance phase, decreased stance time on left lower extremity, and decreased stride length   TODAY'S TREATMENT:                                                                                                                              DATE:                                     1/26 EXERCISE LOG  Exercise Repetitions and Resistance Comments  Nustep  L1 x 15 minutes; seat 9   Gastroc stretch  4 x 30 seconds    Lunges onto step  6" step x 2 minutes   Marching on foam  2 minutes        Blank cell = exercise not performed today  Manual Therapy Soft Tissue Mobilization: left quadriceps, hamstring, and IT band, for improved soft tissue extensibility Passive ROM: flexion and extension, to tolerance   Modalities  Date:  Vaso: Knee, 34 degrees; low pressure, 7 mins, Pain and Edema                                   07/29/22 EXERCISE LOG  Exercise Repetitions and Resistance Comments  Quad set 10 reps w/ 5 second hold    Gastroc stretch  4 x 30 seconds                Blank cell = exercise not performed today    PATIENT EDUCATION:  Education details: reviewed HEP and safely completing these activities to avoid aggravating his symptoms, expectation for soreness, ice Person educated: Patient and Spouse Education method: Explanation Education comprehension: verbalized understanding  HOME EXERCISE PROGRAM: Reviewed HEP provided by referring physician  ASSESSMENT:  CLINICAL IMPRESSION: Patient was introduced to multiple new interventions for improved knee mobility. He required minimal cueing with these interventions for proper exercise performance. He was educated on pacing himself at home with his HEP to avoid significantly aggravating his familiar symptoms. He  experienced a mild increase in discomfort with today's interventions, but this did not limit his ability to complete these activities. Manual therapy focused on soft tissue mobilization  to left quadriceps for reduced pain and improved mobility with minimal effectiveness. However, he reported feeling better upon the conclusion of treatment compared to prior to therapy. He continues to require skilled physical therapy to address his remaining impairments to return to his prior level of function.   OBJECTIVE IMPAIRMENTS: Abnormal gait, decreased activity tolerance, decreased mobility, difficulty walking, decreased ROM, decreased strength, hypomobility, increased edema, impaired flexibility, impaired tone, and pain.   ACTIVITY LIMITATIONS: carrying, lifting, standing, squatting, stairs, transfers, bed mobility, bathing, dressing, and locomotion level  PARTICIPATION LIMITATIONS: meal prep, cleaning, laundry, driving, shopping, community activity, occupation, and yard work  PERSONAL FACTORS: Profession, Time since onset of injury/illness/exacerbation, and 3+ comorbidities: Hypertension, asthma, and osteoarthritis  are also affecting patient's functional outcome.   REHAB POTENTIAL: Good  CLINICAL DECISION MAKING: Stable/uncomplicated  EVALUATION COMPLEXITY: Low   GOALS: Goals reviewed with patient? Yes  LONG TERM GOALS: Target date: 08/26/22  Patient will be independent with his HEP.  Baseline:  Goal status: INITIAL  2.  Patient will be able to demonstrate at least 120 degrees of active left knee flexion for improved function navigating stairs.  Baseline:  Goal status: INITIAL  3.  Patient will be able to demonstrate active left knee extension within 5 degrees of neutral for improved gait mechanics.  Baseline:  Goal status: INITIAL  4.  Patient will be able to safely ambulate at least 80 feet without an assistive device for improved household mobility.  Baseline:  Goal status:  INITIAL  5.  Patient will be able to navigate at least 3 steps with a reciprocal pattern for improved household and community mobility.  Baseline:  Goal status: INITIAL  PLAN:  PT FREQUENCY: 2-3x/week  PT DURATION: 4 weeks  PLANNED INTERVENTIONS: Therapeutic exercises, Therapeutic activity, Neuromuscular re-education, Balance training, Gait training, Patient/Family education, Self Care, Joint mobilization, Stair training, Electrical stimulation, Cryotherapy, Moist heat, Vasopneumatic device, Manual therapy, and Re-evaluation  PLAN FOR NEXT SESSION: nustep, focus on knee extension (quad set, LAQ, SLR, and TKE), manual therapy, and modalities as needed   Darlin Coco, PT 07/31/2022, 10:19 AM

## 2022-08-03 ENCOUNTER — Ambulatory Visit: Payer: Commercial Managed Care - PPO

## 2022-08-03 ENCOUNTER — Encounter: Payer: Self-pay | Admitting: Internal Medicine

## 2022-08-03 DIAGNOSIS — M25662 Stiffness of left knee, not elsewhere classified: Secondary | ICD-10-CM

## 2022-08-03 DIAGNOSIS — M25562 Pain in left knee: Secondary | ICD-10-CM

## 2022-08-03 DIAGNOSIS — R262 Difficulty in walking, not elsewhere classified: Secondary | ICD-10-CM

## 2022-08-03 NOTE — Therapy (Signed)
OUTPATIENT PHYSICAL THERAPY LOWER EXTREMITY TREATMENT   Patient Name: Nyshaun Standage MRN: [id number] DOB:08/04/1960, 62 y.o., male Today's Date: 08/03/2022  END OF SESSION:  PT End of Session - 08/03/22 1503     Visit Number 3    Number of Visits 12    Date for PT Re-Evaluation 08/28/22    PT Start Time 1430    PT Stop Time 1520    PT Time Calculation (min) 50 min    Activity Tolerance Patient tolerated treatment well    Behavior During Therapy WFL for tasks assessed/performed             Past Medical History:  Diagnosis Date   Allergic conjunctivitis    Chronic obstructive asthma, unspecified    History of adenomatous polyp of colon 2005   Less than 1 cm   Hyperlipidemia    IBS (irritable bowel syndrome)    Diarrhea predominant   Insomnia, unspecified    Past Surgical History:  Procedure Laterality Date   COLONOSCOPY     GINGIVAL GRAFT     LEFT MENISCUS TEAR -ARTHROSCOPIC REPAIR     Patient Active Problem List   Diagnosis Date Noted   Primary osteoarthritis of left knee 03/20/2022   Elevated coronary artery calcium score 02/10/2022   Essential hypertension 02/10/2022   Nonspecific abnormal electrocardiogram (ECG) (EKG) 01/12/2022   SOB (shortness of breath) 01/12/2022   Polymyalgia rheumatica ([place]) 11/21/2021   Patellofemoral syndrome of left knee 08/12/2021   Impingement syndrome of left shoulder region 08/12/2021   Adhesive capsulitis of left shoulder 08/12/2021   Pain in joint of left shoulder 06/05/2021   Pain in joint of left knee 06/05/2021   Hyperlipemia 06/28/2013   BPH (benign prostatic hyperplasia) 06/28/2013   Seasonal and perennial allergic rhinitis 09/05/2010   Allergic conjunctivitis and rhinitis 06/12/2007   Chronic obstructive airway disease with asthma ([place]) 06/12/2007   INSOMNIA UNSPECIFIED 06/12/2007   IRRITABLE BOWEL SYNDROME, HX OF 06/12/2007   REFERRING PROVIDER: Sydnee Cabal, MD   REFERRING DIAG: Unilateral primary  osteoarthritis, left knee   THERAPY DIAG:  Acute pain of left knee  Stiffness of left knee, not elsewhere classified  Difficulty in walking, not elsewhere classified  Rationale for Evaluation and Treatment: Rehabilitation  ONSET DATE: 07/23/22  SUBJECTIVE:   SUBJECTIVE STATEMENT: Patient reports that his knee is getting better as he is able to walk a few steps without his walker.   PERTINENT HISTORY: Hypertension, asthma, and osteoarthritis PAIN:  Are you having pain? Yes: NPRS scale: 3-4/10 Pain location: left knee Pain description: sharp, stabbing, quick, pulling Aggravating factors: sitting still Relieving factors: ice and medication  PRECAUTIONS: Knee  WEIGHT BEARING RESTRICTIONS: No  FALLS:  Has patient fallen in last 6 months? 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); stooping, kneeling, crawling, prolonged standing and walking, lifting and moving a truck tire  PLOF: Independent  PATIENT GOALS: return to work, play golf, reduced pain, and walk without an assistive device   NEXT MD VISIT: 08/07/22  OBJECTIVE: all objective assessments were completed at his initial evaluation on 07/29/22 unless otherwise noted  PATIENT SURVEYS:  FOTO 40.01  COGNITION: Overall cognitive status: Within functional limits for tasks assessed     SENSATION: Patient reports no numbness or tingling  PALPATION: TTP: left quadriceps   LOWER EXTREMITY ROM:  Active  ROM Right eval Left eval  Hip flexion    Hip extension    Hip abduction    Hip adduction    Hip internal rotation    Hip external rotation    Knee flexion 138 80/87 (PROM)   Knee extension 0 27  Ankle dorsiflexion    Ankle plantarflexion    Ankle inversion    Ankle eversion     (Blank rows = not tested)  LOWER  EXTREMITY SPECIAL TESTS:  Not tested due to surgical condition  GAIT: Assistive device utilized: Environmental consultant - 2 wheeled Level of assistance: Modified independence Comments: Decreased gait speed, left knee flexed in stance phase, decreased stance time on left lower extremity, and decreased stride length   TODAY'S TREATMENT:                                                                                                                              DATE:                                     1/29 EXERCISE LOG  Exercise Repetitions and Resistance Comments  Nustep  L2 x 16 minutes; seat 8-7   Squatting  20 reps  With UE support  Gastroc stretch 4 x 30 seconds   Seated HS stretch  4 x 30 seconds   Heel slide  20 reps    Blank cell = exercise not performed today  Manual Therapy Passive ROM: left knee extension, with therapist overpressure Modalities  Date:  Vaso: Knee, 34 degrees; low pressure, 10 mins, Pain and Edema                                    1/26 EXERCISE LOG  Exercise Repetitions and Resistance Comments  Nustep  L1 x 15 minutes; seat 9   Gastroc stretch  4 x 30 seconds    Lunges onto step  6" step x 2 minutes   Marching on foam  2 minutes        Blank cell = exercise not performed today  Manual Therapy Soft Tissue Mobilization: left quadriceps, hamstring, and IT band, for improved soft tissue extensibility Passive ROM: flexion and extension, to tolerance   Modalities  Date:  Vaso: Knee, 34 degrees; low pressure, 7 mins, Pain and Edema                                   07/29/22 EXERCISE LOG  Exercise Repetitions and Resistance Comments  Quad set 10 reps w/ 5 second hold    Gastroc stretch  4 x 30 seconds                Blank cell = exercise not performed today  PATIENT EDUCATION:  Education details: reviewed HEP and safely completing these activities to avoid aggravating his symptoms, expectation for soreness, ice Person educated: Patient and  Spouse Education method: Explanation Education comprehension: verbalized understanding  HOME EXERCISE PROGRAM: Reviewed HEP provided by referring physician  ASSESSMENT:  CLINICAL IMPRESSION: Patient was introduced to a seated hamstring stretch and squatting for improved knee extension and lower extremity strength. He required minimal cueing with squatting from proper mechanics to promote squat depth. Manual therapy focused on overpressure into knee extension for improved soft tissue extensibility. He reported feeling alright upon the conclusion of treatment. He continues to require skilled physical therapy to address his remaining impairments to return to his prior level of function.   OBJECTIVE IMPAIRMENTS: Abnormal gait, decreased activity tolerance, decreased mobility, difficulty walking, decreased ROM, decreased strength, hypomobility, increased edema, impaired flexibility, impaired tone, and pain.   ACTIVITY LIMITATIONS: carrying, lifting, standing, squatting, stairs, transfers, bed mobility, bathing, dressing, and locomotion level  PARTICIPATION LIMITATIONS: meal prep, cleaning, laundry, driving, shopping, community activity, occupation, and yard work  PERSONAL FACTORS: Profession, Time since onset of injury/illness/exacerbation, and 3+ comorbidities: Hypertension, asthma, and osteoarthritis  are also affecting patient's functional outcome.   REHAB POTENTIAL: Good  CLINICAL DECISION MAKING: Stable/uncomplicated  EVALUATION COMPLEXITY: Low   GOALS: Goals reviewed with patient? Yes  LONG TERM GOALS: Target date: 08/26/22  Patient will be independent with his HEP.  Baseline:  Goal status: INITIAL  2.  Patient will be able to demonstrate at least 120 degrees of active left knee flexion for improved function navigating stairs.  Baseline:  Goal status: INITIAL  3.  Patient will be able to demonstrate active left knee extension within 5 degrees of neutral for improved gait  mechanics.  Baseline:  Goal status: INITIAL  4.  Patient will be able to safely ambulate at least 80 feet without an assistive device for improved household mobility.  Baseline:  Goal status: INITIAL  5.  Patient will be able to navigate at least 3 steps with a reciprocal pattern for improved household and community mobility.  Baseline:  Goal status: INITIAL  PLAN:  PT FREQUENCY: 2-3x/week  PT DURATION: 4 weeks  PLANNED INTERVENTIONS: Therapeutic exercises, Therapeutic activity, Neuromuscular re-education, Balance training, Gait training, Patient/Family education, Self Care, Joint mobilization, Stair training, Electrical stimulation, Cryotherapy, Moist heat, Vasopneumatic device, Manual therapy, and Re-evaluation  PLAN FOR NEXT SESSION: nustep, focus on knee extension (quad set, LAQ, SLR, and TKE), manual therapy, and modalities as needed   Darlin Coco, PT 08/03/2022, 3:38 PM

## 2022-08-06 ENCOUNTER — Ambulatory Visit: Payer: Commercial Managed Care - PPO | Attending: Specialist

## 2022-08-06 DIAGNOSIS — M25562 Pain in left knee: Secondary | ICD-10-CM | POA: Diagnosis present

## 2022-08-06 DIAGNOSIS — R262 Difficulty in walking, not elsewhere classified: Secondary | ICD-10-CM | POA: Diagnosis present

## 2022-08-06 DIAGNOSIS — M25662 Stiffness of left knee, not elsewhere classified: Secondary | ICD-10-CM | POA: Insufficient documentation

## 2022-08-06 NOTE — Therapy (Signed)
OUTPATIENT PHYSICAL THERAPY LOWER EXTREMITY TREATMENT   Patient Name: Justin Pennington MRN: [id number] DOB:11/26/1960, 62 y.o., male Today's Date: 08/06/2022  END OF SESSION:  PT End of Session - 08/06/22 0818     Visit Number 4    Number of Visits 12    Date for PT Re-Evaluation 08/28/22    PT Start Time 0815    PT Stop Time 0905    PT Time Calculation (min) 50 min    Activity Tolerance Patient tolerated treatment well    Behavior During Therapy [facility] for tasks assessed/performed             Past Medical History:  Diagnosis Date   Allergic conjunctivitis    Chronic obstructive asthma, unspecified    History of adenomatous polyp of colon 2005   Less than 1 cm   Hyperlipidemia    IBS (irritable bowel syndrome)    Diarrhea predominant   Insomnia, unspecified    Past Surgical History:  Procedure Laterality Date   COLONOSCOPY     GINGIVAL GRAFT     LEFT MENISCUS TEAR -ARTHROSCOPIC REPAIR     Patient Active Problem List   Diagnosis Date Noted   Primary osteoarthritis of left knee 03/20/2022   Elevated coronary artery calcium score 02/10/2022   Essential hypertension 02/10/2022   Nonspecific abnormal electrocardiogram (ECG) (EKG) 01/12/2022   SOB (shortness of breath) 01/12/2022   Polymyalgia rheumatica ([place]) 11/21/2021   Patellofemoral syndrome of left knee 08/12/2021   Impingement syndrome of left shoulder region 08/12/2021   Adhesive capsulitis of left shoulder 08/12/2021   Pain in joint of left shoulder 06/05/2021   Pain in joint of left knee 06/05/2021   Hyperlipemia 06/28/2013   BPH (benign prostatic hyperplasia) 06/28/2013   Seasonal and perennial allergic rhinitis 09/05/2010   Allergic conjunctivitis and rhinitis 06/12/2007   Chronic obstructive airway disease with asthma ([place]) 06/12/2007   INSOMNIA UNSPECIFIED 06/12/2007   IRRITABLE BOWEL SYNDROME, HX OF 06/12/2007   REFERRING PROVIDER: Sydnee Cabal, MD   REFERRING DIAG: Unilateral primary  osteoarthritis, left knee   THERAPY DIAG:  Acute pain of left knee  Stiffness of left knee, not elsewhere classified  Difficulty in walking, not elsewhere classified  Rationale for Evaluation and Treatment: Rehabilitation  ONSET DATE: 07/23/22  SUBJECTIVE:   SUBJECTIVE STATEMENT: Patient reports that his knee feels alright, but his thigh is really sore.   PERTINENT HISTORY: Hypertension, asthma, and osteoarthritis PAIN:  Are you having pain? Yes: NPRS scale: 3/10 Pain location: left knee Pain description: sharp, stabbing, quick, pulling Aggravating factors: sitting still Relieving factors: ice and medication  PRECAUTIONS: Knee  WEIGHT BEARING RESTRICTIONS: No  FALLS:  Has patient fallen in last 6 months? 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); stooping, kneeling, crawling, prolonged standing and walking, lifting and moving a truck tire  PLOF: Independent  PATIENT GOALS: return to work, play golf, reduced pain, and walk without an assistive device   NEXT MD VISIT: 08/07/22  OBJECTIVE: all objective assessments were completed at his initial evaluation on 07/29/22 unless otherwise noted  PATIENT SURVEYS:  FOTO 40.01  COGNITION: Overall cognitive status: Within functional limits for tasks assessed     SENSATION: Patient reports no numbness or tingling  PALPATION: TTP: left quadriceps   LOWER EXTREMITY ROM:  Active ROM Right eval Left eval Left 08/06/22  Progress report  Hip flexion     Hip extension     Hip abduction     Hip adduction     Hip internal rotation     Hip external rotation     Knee flexion 138 80/87 (PROM)  85/89 (PROM)  Knee extension 0 27 15  Ankle dorsiflexion     Ankle plantarflexion     Ankle inversion     Ankle eversion      (Blank rows = not  tested)  LOWER EXTREMITY SPECIAL TESTS:  Not tested due to surgical condition  GAIT: Assistive device utilized: Environmental consultant - 2 wheeled Level of assistance: Modified independence Comments: Decreased gait speed, left knee flexed in stance phase, decreased stance time on left lower extremity, and decreased stride length   TODAY'S TREATMENT:                                                                                                                              DATE:                                     2/1 EXERCISE LOG  Exercise Repetitions and Resistance Comments  Nustep  L3 x 17 minutes; seat 8-7   Lunges onto a step  6" step; 2 minutes   Rocker board  2 minutes   Standing TKE  15 reps w/ 5 second hold    Heel slide  25 reps    Blank cell = exercise not performed today  Manual Therapy Soft Tissue Mobilization: left quadriceps, for improved soft tissue extensibility Passive ROM: flexion and extension, to tolerance   Modalities  Date:  Vaso: Knee, 34 degrees; low pressure, 9 mins, Pain and Edema                                   1/29 EXERCISE LOG  Exercise Repetitions and Resistance Comments  Nustep  L2 x 16 minutes; seat 8-7   Squatting  20 reps  With UE support  Gastroc stretch 4 x 30 seconds   Seated HS stretch  4 x 30 seconds   Heel slide  20 reps    Blank cell = exercise not performed today  Manual Therapy Passive ROM: left knee extension, with therapist overpressure Modalities  Date:  Vaso: Knee, 34 degrees; low pressure, 10 mins, Pain and Edema                                    1/26 EXERCISE LOG  Exercise Repetitions and Resistance Comments  Nustep  L1 x 15 minutes; seat 9   Gastroc stretch  4 x 30 seconds    Lunges onto step  6" step x 2 minutes  Marching on foam  2 minutes        Blank cell = exercise not performed today  Manual Therapy Soft Tissue Mobilization: left quadriceps, hamstring, and IT band, for improved soft tissue extensibility Passive  ROM: flexion and extension, to tolerance   Modalities  Date:  Vaso: Knee, 34 degrees; low pressure, 7 mins, Pain and Edema  PATIENT EDUCATION:  Education details: reviewed HEP and safely completing these activities to avoid aggravating his symptoms, expectation for soreness, ice Person educated: Patient and Spouse Education method: Explanation Education comprehension: verbalized understanding  HOME EXERCISE PROGRAM: Reviewed HEP provided by referring physician  ASSESSMENT:  CLINICAL IMPRESSION: Patient is making good progress with skilled physical therapy as evidenced by his subjective reports, objective measures, functional mobility, and progress toward his goals. He was able to demonstrate improved knee flexion and extension since his initial evaluation on 07/29/22. Treatment focused on new and familiar interventions for improved knee mobility. Manual therapy focused on PROM for improved knee mobility. He reported feeling alright upon the conclusion of treatment. He continues to require skilled physical therapy therapy to address his remaining impairments to return to his prior level of function.   OBJECTIVE IMPAIRMENTS: Abnormal gait, decreased activity tolerance, decreased mobility, difficulty walking, decreased ROM, decreased strength, hypomobility, increased edema, impaired flexibility, impaired tone, and pain.   ACTIVITY LIMITATIONS: carrying, lifting, standing, squatting, stairs, transfers, bed mobility, bathing, dressing, and locomotion level  PARTICIPATION LIMITATIONS: meal prep, cleaning, laundry, driving, shopping, community activity, occupation, and yard work  PERSONAL FACTORS: Profession, Time since onset of injury/illness/exacerbation, and 3+ comorbidities: Hypertension, asthma, and osteoarthritis  are also affecting patient's functional outcome.   REHAB POTENTIAL: Good  CLINICAL DECISION MAKING: Stable/uncomplicated  EVALUATION COMPLEXITY: Low   GOALS: Goals  reviewed with patient? Yes  LONG TERM GOALS: Target date: 08/26/22  Patient will be independent with his HEP.  Baseline:  Goal status: INITIAL  2.  Patient will be able to demonstrate at least 120 degrees of active left knee flexion for improved function navigating stairs.  Baseline:  Goal status: INITIAL  3.  Patient will be able to demonstrate active left knee extension within 5 degrees of neutral for improved gait mechanics.  Baseline:  Goal status: INITIAL  4.  Patient will be able to safely ambulate at least 80 feet without an assistive device for improved household mobility.  Baseline:  Goal status: INITIAL  5.  Patient will be able to navigate at least 3 steps with a reciprocal pattern for improved household and community mobility.  Baseline:  Goal status: INITIAL  PLAN:  PT FREQUENCY: 2-3x/week  PT DURATION: 4 weeks  PLANNED INTERVENTIONS: Therapeutic exercises, Therapeutic activity, Neuromuscular re-education, Balance training, Gait training, Patient/Family education, Self Care, Joint mobilization, Stair training, Electrical stimulation, Cryotherapy, Moist heat, Vasopneumatic device, Manual therapy, and Re-evaluation  PLAN FOR NEXT SESSION: nustep, focus on knee extension (quad set, LAQ, SLR, and TKE), manual therapy, and modalities as needed   Darlin Coco, PT 08/06/2022, 10:51 AM

## 2022-08-11 ENCOUNTER — Ambulatory Visit: Payer: Commercial Managed Care - PPO

## 2022-08-11 DIAGNOSIS — R262 Difficulty in walking, not elsewhere classified: Secondary | ICD-10-CM

## 2022-08-11 DIAGNOSIS — M25662 Stiffness of left knee, not elsewhere classified: Secondary | ICD-10-CM

## 2022-08-11 DIAGNOSIS — M25562 Pain in left knee: Secondary | ICD-10-CM

## 2022-08-11 NOTE — Therapy (Signed)
OUTPATIENT PHYSICAL THERAPY LOWER EXTREMITY TREATMENT   Patient Name: Sherod Cisse MRN: [id number] DOB:02/13/1961, 62 y.o., male Today's Date: 08/11/2022  END OF SESSION:  PT End of Session - 08/11/22 0828     Visit Number 5    Number of Visits 12    Date for PT Re-Evaluation 08/28/22    PT Start Time 0815    PT Stop Time 0910    PT Time Calculation (min) 55 min    Activity Tolerance Patient tolerated treatment well    Behavior During Therapy WFL for tasks assessed/performed             Past Medical History:  Diagnosis Date   Allergic conjunctivitis    Chronic obstructive asthma, unspecified    History of adenomatous polyp of colon 2005   Less than 1 cm   Hyperlipidemia    IBS (irritable bowel syndrome)    Diarrhea predominant   Insomnia, unspecified    Past Surgical History:  Procedure Laterality Date   COLONOSCOPY     GINGIVAL GRAFT     LEFT MENISCUS TEAR -ARTHROSCOPIC REPAIR     Patient Active Problem List   Diagnosis Date Noted   Primary osteoarthritis of left knee 03/20/2022   Elevated coronary artery calcium score 02/10/2022   Essential hypertension 02/10/2022   Nonspecific abnormal electrocardiogram (ECG) (EKG) 01/12/2022   SOB (shortness of breath) 01/12/2022   Polymyalgia rheumatica ([place]) 11/21/2021   Patellofemoral syndrome of left knee 08/12/2021   Impingement syndrome of left shoulder region 08/12/2021   Adhesive capsulitis of left shoulder 08/12/2021   Pain in joint of left shoulder 06/05/2021   Pain in joint of left knee 06/05/2021   Hyperlipemia 06/28/2013   BPH (benign prostatic hyperplasia) 06/28/2013   Seasonal and perennial allergic rhinitis 09/05/2010   Allergic conjunctivitis and rhinitis 06/12/2007   Chronic obstructive airway disease with asthma ([place]) 06/12/2007   INSOMNIA UNSPECIFIED 06/12/2007   IRRITABLE BOWEL SYNDROME, HX OF 06/12/2007   REFERRING PROVIDER: Sydnee Cabal, MD   REFERRING DIAG: Unilateral primary  osteoarthritis, left knee   THERAPY DIAG:  Acute pain of left knee  Stiffness of left knee, not elsewhere classified  Difficulty in walking, not elsewhere classified  Rationale for Evaluation and Treatment: Rehabilitation  ONSET DATE: 07/23/22  SUBJECTIVE:   SUBJECTIVE STATEMENT: Patient reports that his knee is sore this morning. He saw his physician and was told everything was looking.   PERTINENT HISTORY: Hypertension, asthma, and osteoarthritis PAIN:  Are you having pain? Yes: NPRS scale: 3/10 Pain location: left knee Pain description: sharp, stabbing, quick, pulling Aggravating factors: sitting still Relieving factors: ice and medication  PRECAUTIONS: Knee  WEIGHT BEARING RESTRICTIONS: No  FALLS:  Has patient fallen in last 6 months? 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); stooping, kneeling, crawling, prolonged standing and walking, lifting and moving a truck tire  PLOF: Independent  PATIENT GOALS: return to work, play golf, reduced pain, and walk without an assistive device   NEXT MD VISIT: 08/07/22  OBJECTIVE: all objective assessments were completed at his initial evaluation on 07/29/22 unless otherwise noted  PATIENT SURVEYS:  FOTO 40.01  COGNITION: Overall cognitive status: Within functional limits for tasks assessed     SENSATION: Patient reports no numbness or tingling  PALPATION: TTP: left quadriceps   LOWER EXTREMITY ROM:  Active ROM  Right eval Left eval Left 08/06/22 Progress report  Hip flexion     Hip extension     Hip abduction     Hip adduction     Hip internal rotation     Hip external rotation     Knee flexion 138 80/87 (PROM)  85/89 (PROM)  Knee extension 0 27 15  Ankle dorsiflexion     Ankle plantarflexion     Ankle inversion      Ankle eversion      (Blank rows = not tested)  LOWER EXTREMITY SPECIAL TESTS:  Not tested due to surgical condition  GAIT: Assistive device utilized: Environmental consultant - 2 wheeled Level of assistance: Modified independence Comments: Decreased gait speed, left knee flexed in stance phase, decreased stance time on left lower extremity, and decreased stride length   TODAY'S TREATMENT:                                                                                                                              DATE:                                     2/(6 EXERCISE LOG  Exercise Repetitions and Resistance Comments  Nustep L3 x 16 minutes; seat 8-6   Rocker board 3 minutes   Side stepping on foam 2.5 minutes    LAQ 25 reps    Seated heel slide 2 minutes    Eccentric heel tap  6" step x 20 reps    Tandem balance on foam 4 x 30 seconds    Blank cell = exercise not performed today  Modalities  Date:  Vaso: Knee, 34 degrees; low pressure, 15 mins, Pain and Edema                                   2/1 EXERCISE LOG  Exercise Repetitions and Resistance Comments  Nustep  L3 x 17 minutes; seat 8-7   Lunges onto a step  6" step; 2 minutes   Rocker board  2 minutes   Standing TKE  15 reps w/ 5 second hold    Heel slide  25 reps    Blank cell = exercise not performed today  Manual Therapy Soft Tissue Mobilization: left quadriceps, for improved soft tissue extensibility Passive ROM: flexion and extension, to tolerance   Modalities  Date:  Vaso: Knee, 34 degrees; low pressure, 9 mins, Pain and Edema                                   1/29 EXERCISE LOG  Exercise Repetitions and Resistance Comments  Nustep  L2 x 16 minutes; seat 8-7   Squatting  20 reps  With UE support  Gastroc  stretch 4 x 30 seconds   Seated HS stretch  4 x 30 seconds   Heel slide  20 reps    Blank cell = exercise not performed today  Manual Therapy Passive ROM: left knee extension, with therapist  overpressure Modalities  Date:  Vaso: Knee, 34 degrees; low pressure, 10 mins, Pain and Edema  PATIENT EDUCATION:  Education details: healing, benefits of walking and exercise Person educated: Patient and Spouse Education method: Explanation Education comprehension: verbalized understanding  HOME EXERCISE PROGRAM: Reviewed HEP provided by referring physician  ASSESSMENT:  CLINICAL IMPRESSION: Patient was introduced to multiple new interventions for improved quadriceps control and lower extremity stability. He required minimal cueing with long arc quads to limit hip flexion to isolate quadriceps engagement. He experienced a mild increase in soreness and fatigue with today's interventions, but this did not limit his ability to complete any of today's interventions. He reported that his knee felt good upon the conclusion of treatment. He continues to require skilled physical therapy to address his remaining impairments to return to his prior level of function.   OBJECTIVE IMPAIRMENTS: Abnormal gait, decreased activity tolerance, decreased mobility, difficulty walking, decreased ROM, decreased strength, hypomobility, increased edema, impaired flexibility, impaired tone, and pain.   ACTIVITY LIMITATIONS: carrying, lifting, standing, squatting, stairs, transfers, bed mobility, bathing, dressing, and locomotion level  PARTICIPATION LIMITATIONS: meal prep, cleaning, laundry, driving, shopping, community activity, occupation, and yard work  PERSONAL FACTORS: Profession, Time since onset of injury/illness/exacerbation, and 3+ comorbidities: Hypertension, asthma, and osteoarthritis  are also affecting patient's functional outcome.   REHAB POTENTIAL: Good  CLINICAL DECISION MAKING: Stable/uncomplicated  EVALUATION COMPLEXITY: Low   GOALS: Goals reviewed with patient? Yes  LONG TERM GOALS: Target date: 08/26/22  Patient will be independent with his HEP.  Baseline:  Goal status:  INITIAL  2.  Patient will be able to demonstrate at least 120 degrees of active left knee flexion for improved function navigating stairs.  Baseline:  Goal status: INITIAL  3.  Patient will be able to demonstrate active left knee extension within 5 degrees of neutral for improved gait mechanics.  Baseline:  Goal status: INITIAL  4.  Patient will be able to safely ambulate at least 80 feet without an assistive device for improved household mobility.  Baseline:  Goal status: INITIAL  5.  Patient will be able to navigate at least 3 steps with a reciprocal pattern for improved household and community mobility.  Baseline:  Goal status: INITIAL  PLAN:  PT FREQUENCY: 2-3x/week  PT DURATION: 4 weeks  PLANNED INTERVENTIONS: Therapeutic exercises, Therapeutic activity, Neuromuscular re-education, Balance training, Gait training, Patient/Family education, Self Care, Joint mobilization, Stair training, Electrical stimulation, Cryotherapy, Moist heat, Vasopneumatic device, Manual therapy, and Re-evaluation  PLAN FOR NEXT SESSION: nustep, focus on knee extension (quad set, LAQ, SLR, and TKE), manual therapy, and modalities as needed   Darlin Coco, PT 08/11/2022, 12:41 PM

## 2022-08-14 ENCOUNTER — Ambulatory Visit: Payer: Commercial Managed Care - PPO

## 2022-08-14 DIAGNOSIS — R262 Difficulty in walking, not elsewhere classified: Secondary | ICD-10-CM

## 2022-08-14 DIAGNOSIS — M25662 Stiffness of left knee, not elsewhere classified: Secondary | ICD-10-CM

## 2022-08-14 DIAGNOSIS — M25562 Pain in left knee: Secondary | ICD-10-CM

## 2022-08-14 NOTE — Therapy (Signed)
OUTPATIENT PHYSICAL THERAPY LOWER EXTREMITY TREATMENT   Patient Name: Keshon Worrell MRN: [id number] DOB:14-May-1961, 62 y.o., male Today's Date: 08/14/2022  END OF SESSION:  PT End of Session - 08/14/22 0823     Visit Number 6    Number of Visits 12    Date for PT Re-Evaluation 08/28/22    PT Start Time 0815    PT Stop Time 0911    PT Time Calculation (min) 56 min    Activity Tolerance Patient tolerated treatment well    Behavior During Therapy WFL for tasks assessed/performed             Past Medical History:  Diagnosis Date   Allergic conjunctivitis    Chronic obstructive asthma, unspecified    History of adenomatous polyp of colon 2005   Less than 1 cm   Hyperlipidemia    IBS (irritable bowel syndrome)    Diarrhea predominant   Insomnia, unspecified    Past Surgical History:  Procedure Laterality Date   COLONOSCOPY     GINGIVAL GRAFT     LEFT MENISCUS TEAR -ARTHROSCOPIC REPAIR     Patient Active Problem List   Diagnosis Date Noted   Primary osteoarthritis of left knee 03/20/2022   Elevated coronary artery calcium score 02/10/2022   Essential hypertension 02/10/2022   Nonspecific abnormal electrocardiogram (ECG) (EKG) 01/12/2022   SOB (shortness of breath) 01/12/2022   Polymyalgia rheumatica ([place]) 11/21/2021   Patellofemoral syndrome of left knee 08/12/2021   Impingement syndrome of left shoulder region 08/12/2021   Adhesive capsulitis of left shoulder 08/12/2021   Pain in joint of left shoulder 06/05/2021   Pain in joint of left knee 06/05/2021   Hyperlipemia 06/28/2013   BPH (benign prostatic hyperplasia) 06/28/2013   Seasonal and perennial allergic rhinitis 09/05/2010   Allergic conjunctivitis and rhinitis 06/12/2007   Chronic obstructive airway disease with asthma ([place]) 06/12/2007   INSOMNIA UNSPECIFIED 06/12/2007   IRRITABLE BOWEL SYNDROME, HX OF 06/12/2007   REFERRING PROVIDER: Sydnee Cabal, MD   REFERRING DIAG: Unilateral primary  osteoarthritis, left knee   THERAPY DIAG:  Acute pain of left knee  Stiffness of left knee, not elsewhere classified  Difficulty in walking, not elsewhere classified  Rationale for Evaluation and Treatment: Rehabilitation  ONSET DATE: 07/23/22  SUBJECTIVE:   SUBJECTIVE STATEMENT: Patient reports that his knee feels alright today. He still has some difficulty getting comfortable and sleeping at night.   PERTINENT HISTORY: Hypertension, asthma, and osteoarthritis PAIN:  Are you having pain? Yes: NPRS scale: 3/10 Pain location: left knee Pain description: sharp, stabbing, quick, pulling Aggravating factors: sitting still Relieving factors: ice and medication  PRECAUTIONS: Knee  WEIGHT BEARING RESTRICTIONS: No  FALLS:  Has patient fallen in last 6 months? 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); stooping, kneeling, crawling, prolonged standing and walking, lifting and moving a truck tire  PLOF: Independent  PATIENT GOALS: return to work, play golf, reduced pain, and walk without an assistive device   NEXT MD VISIT: 08/07/22  OBJECTIVE: all objective assessments were completed at his initial evaluation on 07/29/22 unless otherwise noted  PATIENT SURVEYS:  FOTO 40.01  COGNITION: Overall cognitive status: Within functional limits for tasks assessed     SENSATION: Patient reports no numbness or tingling  PALPATION: TTP: left quadriceps   LOWER EXTREMITY ROM:  Active ROM  Right eval Left eval Left 08/06/22 Progress report  Hip flexion     Hip extension     Hip abduction     Hip adduction     Hip internal rotation     Hip external rotation     Knee flexion 138 80/87 (PROM)  85/89 (PROM)  Knee extension 0 27 15  Ankle dorsiflexion     Ankle plantarflexion     Ankle  inversion     Ankle eversion      (Blank rows = not tested)  LOWER EXTREMITY SPECIAL TESTS:  Not tested due to surgical condition  GAIT: Assistive device utilized: Environmental consultant - 2 wheeled Level of assistance: Modified independence Comments: Decreased gait speed, left knee flexed in stance phase, decreased stance time on left lower extremity, and decreased stride length   TODAY'S TREATMENT:                                                                                                                              DATE:                                     2/9 EXERCISE LOG  Exercise Repetitions and Resistance Comments  Nustep  L3 x 15 minutes; seat 7-6   Gastroc stretch  4 x 30 seconds    Lunges onto step  8" step x 30 reps    Tandem on foam  4 x 30 seconds each    LAQ 25 reps    SLR 20 reps     Blank cell = exercise not performed today  Manual Therapy Soft Tissue Mobilization: left quadriceps, for reduced pain and tone Joint Mobilizations: patellar, grade I-III for reduced pain and improved mobility  Passive ROM: flexion and extension, to tolerance   Modalities  Date:  Vaso: Knee, 34 degrees; medium pressure, 15 mins, Pain and Edema                                   2/(6 EXERCISE LOG  Exercise Repetitions and Resistance Comments  Nustep L3 x 16 minutes; seat 8-6   Rocker board 3 minutes   Side stepping on foam 2.5 minutes    LAQ 25 reps    Seated heel slide 2 minutes    Eccentric heel tap  6" step x 20 reps    Tandem balance on foam 4 x 30 seconds    Blank cell = exercise not performed today  Modalities  Date:  Vaso: Knee, 34 degrees; low pressure, 15 mins, Pain and Edema                                   2/1 EXERCISE LOG  Exercise Repetitions and Resistance Comments  Nustep  L3 x 17 minutes; seat 8-7   Lunges onto a step  6" step; 2 minutes   Rocker board  2 minutes   Standing TKE  15 reps w/ 5 second hold    Heel slide  25 reps    Blank cell = exercise not  performed today  Manual Therapy Soft Tissue Mobilization: left quadriceps, for improved soft tissue extensibility Passive ROM: flexion and extension, to tolerance   Modalities  Date:  Vaso: Knee, 34 degrees; low pressure, 9 mins, Pain and Edema  PATIENT EDUCATION:  Education details: healing, benefits of walking and exercise Person educated: Patient and Spouse Education method: Explanation Education comprehension: verbalized understanding  HOME EXERCISE PROGRAM: Reviewed HEP provided by referring physician  ASSESSMENT:  CLINICAL IMPRESSION: Patient was progressed with multiple new and familiar interventions for improved knee mobility. He required minimal cueing with straight leg raises for quadriceps engagement to maintain knee extension. Manual therapy focused on improved knee mobility through the use of soft tissue mobilization, patellar mobilizations, and passive range of motion. He reported that his knee "does not feel bad" upon the conclusion of treatment. He continues to require skilled physical therapy to address his remaining impairments to return to his prior level of function.   OBJECTIVE IMPAIRMENTS: Abnormal gait, decreased activity tolerance, decreased mobility, difficulty walking, decreased ROM, decreased strength, hypomobility, increased edema, impaired flexibility, impaired tone, and pain.   ACTIVITY LIMITATIONS: carrying, lifting, standing, squatting, stairs, transfers, bed mobility, bathing, dressing, and locomotion level  PARTICIPATION LIMITATIONS: meal prep, cleaning, laundry, driving, shopping, community activity, occupation, and yard work  PERSONAL FACTORS: Profession, Time since onset of injury/illness/exacerbation, and 3+ comorbidities: Hypertension, asthma, and osteoarthritis  are also affecting patient's functional outcome.   REHAB POTENTIAL: Good  CLINICAL DECISION MAKING: Stable/uncomplicated  EVALUATION COMPLEXITY: Low   GOALS: Goals reviewed with  patient? Yes  LONG TERM GOALS: Target date: 08/26/22  Patient will be independent with his HEP.  Baseline:  Goal status: INITIAL  2.  Patient will be able to demonstrate at least 120 degrees of active left knee flexion for improved function navigating stairs.  Baseline:  Goal status: INITIAL  3.  Patient will be able to demonstrate active left knee extension within 5 degrees of neutral for improved gait mechanics.  Baseline:  Goal status: INITIAL  4.  Patient will be able to safely ambulate at least 80 feet without an assistive device for improved household mobility.  Baseline:  Goal status: INITIAL  5.  Patient will be able to navigate at least 3 steps with a reciprocal pattern for improved household and community mobility.  Baseline:  Goal status: INITIAL  PLAN:  PT FREQUENCY: 2-3x/week  PT DURATION: 4 weeks  PLANNED INTERVENTIONS: Therapeutic exercises, Therapeutic activity, Neuromuscular re-education, Balance training, Gait training, Patient/Family education, Self Care, Joint mobilization, Stair training, Electrical stimulation, Cryotherapy, Moist heat, Vasopneumatic device, Manual therapy, and Re-evaluation  PLAN FOR NEXT SESSION: nustep, focus on knee extension (quad set, LAQ, SLR, and TKE), manual therapy, and modalities as needed   Darlin Coco, PT 08/14/2022, 10:12 AM

## 2022-08-17 ENCOUNTER — Ambulatory Visit: Payer: Commercial Managed Care - PPO

## 2022-08-17 DIAGNOSIS — R262 Difficulty in walking, not elsewhere classified: Secondary | ICD-10-CM

## 2022-08-17 DIAGNOSIS — M25562 Pain in left knee: Secondary | ICD-10-CM | POA: Diagnosis not present

## 2022-08-17 DIAGNOSIS — M25662 Stiffness of left knee, not elsewhere classified: Secondary | ICD-10-CM

## 2022-08-17 NOTE — Therapy (Signed)
OUTPATIENT PHYSICAL THERAPY LOWER EXTREMITY TREATMENT   Patient Name: Justin Pennington MRN: [id number] DOB:December 21, 1960, 62 y.o., male Today's Date: 08/17/2022  END OF SESSION:  PT End of Session - 08/17/22 1437     Visit Number 7    Number of Visits 12    Date for PT Re-Evaluation 08/28/22    PT Start Time 1430    PT Stop Time 1524    PT Time Calculation (min) 54 min    Activity Tolerance Patient tolerated treatment well    Behavior During Therapy WFL for tasks assessed/performed              Past Medical History:  Diagnosis Date   Allergic conjunctivitis    Chronic obstructive asthma, unspecified    History of adenomatous polyp of colon 2005   Less than 1 cm   Hyperlipidemia    IBS (irritable bowel syndrome)    Diarrhea predominant   Insomnia, unspecified    Past Surgical History:  Procedure Laterality Date   COLONOSCOPY     GINGIVAL GRAFT     LEFT MENISCUS TEAR -ARTHROSCOPIC REPAIR     Patient Active Problem List   Diagnosis Date Noted   Primary osteoarthritis of left knee 03/20/2022   Elevated coronary artery calcium score 02/10/2022   Essential hypertension 02/10/2022   Nonspecific abnormal electrocardiogram (ECG) (EKG) 01/12/2022   SOB (shortness of breath) 01/12/2022   Polymyalgia rheumatica ([place]) 11/21/2021   Patellofemoral syndrome of left knee 08/12/2021   Impingement syndrome of left shoulder region 08/12/2021   Adhesive capsulitis of left shoulder 08/12/2021   Pain in joint of left shoulder 06/05/2021   Pain in joint of left knee 06/05/2021   Hyperlipemia 06/28/2013   BPH (benign prostatic hyperplasia) 06/28/2013   Seasonal and perennial allergic rhinitis 09/05/2010   Allergic conjunctivitis and rhinitis 06/12/2007   Chronic obstructive airway disease with asthma ([place]) 06/12/2007   INSOMNIA UNSPECIFIED 06/12/2007   IRRITABLE BOWEL SYNDROME, HX OF 06/12/2007   REFERRING PROVIDER: Sydnee Cabal, MD   REFERRING DIAG: Unilateral primary  osteoarthritis, left knee   THERAPY DIAG:  Acute pain of left knee  Stiffness of left knee, not elsewhere classified  Difficulty in walking, not elsewhere classified  Rationale for Evaluation and Treatment: Rehabilitation  ONSET DATE: 07/23/22  SUBJECTIVE:   SUBJECTIVE STATEMENT: Patient reports that he was really sore after his last appointment, but it is a little better today.   PERTINENT HISTORY: Hypertension, asthma, and osteoarthritis PAIN:  Are you having pain? Yes: NPRS scale: 3/10 Pain location: left knee Pain description: sharp, stabbing, quick, pulling Aggravating factors: sitting still Relieving factors: ice and medication  PRECAUTIONS: Knee  WEIGHT BEARING RESTRICTIONS: No  FALLS:  Has patient fallen in last 6 months? 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); stooping, kneeling, crawling, prolonged standing and walking, lifting and moving a truck tire  PLOF: Independent  PATIENT GOALS: return to work, play golf, reduced pain, and walk without an assistive device   NEXT MD VISIT: 08/07/22  OBJECTIVE: all objective assessments were completed at his initial evaluation on 07/29/22 unless otherwise noted  PATIENT SURVEYS:  FOTO 40.01  COGNITION: Overall cognitive status: Within functional limits for tasks assessed     SENSATION: Patient reports no numbness or tingling  PALPATION: TTP: left quadriceps   LOWER EXTREMITY ROM:  Active ROM  Right eval Left eval Left 08/06/22 Progress report  Hip flexion     Hip extension     Hip abduction     Hip adduction     Hip internal rotation     Hip external rotation     Knee flexion 138 80/87 (PROM)  85/89 (PROM)  Knee extension 0 27 15  Ankle dorsiflexion     Ankle plantarflexion     Ankle inversion     Ankle  eversion      (Blank rows = not tested)  LOWER EXTREMITY SPECIAL TESTS:  Not tested due to surgical condition  GAIT: Assistive device utilized: Environmental consultant - 2 wheeled Level of assistance: Modified independence Comments: Decreased gait speed, left knee flexed in stance phase, decreased stance time on left lower extremity, and decreased stride length   TODAY'S TREATMENT:                                                                                                                              DATE:                                     2/12 EXERCISE LOG  Exercise Repetitions and Resistance Comments  Nustep  L3 x 17 minutes; seat 8-6   Marching on foam  2 minutes    LAQ 2# x 20 reps    Rocker board  3.5 minutes    Sit to stand  10 reps  From lowered mat table  Eccentric heel tap  4" step x 25 reps     Blank cell = exercise not performed today  Manual Therapy Soft Tissue Mobilization: left quadriceps, for reduced pain and tone  Joint Mobilizations: patellar, grade I-IV  Passive ROM: Flexion and extension, with overpressure to tolerance   Modalities  Date:  Vaso: Knee, 34 degrees; medium pressure, 10 mins, Pain and Edema                                   2/9 EXERCISE LOG  Exercise Repetitions and Resistance Comments  Nustep  L3 x 15 minutes; seat 7-6   Gastroc stretch  4 x 30 seconds    Lunges onto step  8" step x 30 reps    Tandem on foam  4 x 30 seconds each    LAQ 25 reps    SLR 20 reps     Blank cell = exercise not performed today  Manual Therapy Soft Tissue Mobilization: left quadriceps, for reduced pain and tone Joint Mobilizations: patellar, grade I-III for reduced pain and improved mobility  Passive ROM: flexion and extension, to tolerance   Modalities  Date:  Vaso: Knee, 34 degrees; medium pressure, 15 mins, Pain and Edema  2/(6 EXERCISE LOG  Exercise Repetitions and Resistance Comments  Nustep L3 x 16 minutes; seat 8-6   Rocker  board 3 minutes   Side stepping on foam 2.5 minutes    LAQ 25 reps    Seated heel slide 2 minutes    Eccentric heel tap  6" step x 20 reps    Tandem balance on foam 4 x 30 seconds    Blank cell = exercise not performed today  Modalities  Date:  Vaso: Knee, 34 degrees; low pressure, 15 mins, Pain and Edema  PATIENT EDUCATION:  Education details: healing, benefits of walking and exercise Person educated: Patient and Spouse Education method: Explanation Education comprehension: verbalized understanding  HOME EXERCISE PROGRAM: Reviewed HEP provided by referring physician  ASSESSMENT:  CLINICAL IMPRESSION: Patient was progressed with eccentric heel taps for improved eccentric quadriceps control for improved function descending stairs. He required minimal cueing with this activity for proper biomechanics to facilitate increased demand on the left quadriceps. Manual therapy focused on improved mobility through the use of soft tissue mobilization to the left quadriceps, patellar joint mobilization, and passive range of motion. He reported that his knee was more sore upon the conclusion of treatment.  He continues to require skilled physical therapy to address his remaining impairments to return to his prior level of function.  OBJECTIVE IMPAIRMENTS: Abnormal gait, decreased activity tolerance, decreased mobility, difficulty walking, decreased ROM, decreased strength, hypomobility, increased edema, impaired flexibility, impaired tone, and pain.   ACTIVITY LIMITATIONS: carrying, lifting, standing, squatting, stairs, transfers, bed mobility, bathing, dressing, and locomotion level  PARTICIPATION LIMITATIONS: meal prep, cleaning, laundry, driving, shopping, community activity, occupation, and yard work  PERSONAL FACTORS: Profession, Time since onset of injury/illness/exacerbation, and 3+ comorbidities: Hypertension, asthma, and osteoarthritis  are also affecting patient's functional outcome.    REHAB POTENTIAL: Good  CLINICAL DECISION MAKING: Stable/uncomplicated  EVALUATION COMPLEXITY: Low   GOALS: Goals reviewed with patient? Yes  LONG TERM GOALS: Target date: 08/26/22  Patient will be independent with his HEP.  Baseline:  Goal status: INITIAL  2.  Patient will be able to demonstrate at least 120 degrees of active left knee flexion for improved function navigating stairs.  Baseline:  Goal status: INITIAL  3.  Patient will be able to demonstrate active left knee extension within 5 degrees of neutral for improved gait mechanics.  Baseline:  Goal status: INITIAL  4.  Patient will be able to safely ambulate at least 80 feet without an assistive device for improved household mobility.  Baseline:  Goal status: INITIAL  5.  Patient will be able to navigate at least 3 steps with a reciprocal pattern for improved household and community mobility.  Baseline:  Goal status: INITIAL  PLAN:  PT FREQUENCY: 2-3x/week  PT DURATION: 4 weeks  PLANNED INTERVENTIONS: Therapeutic exercises, Therapeutic activity, Neuromuscular re-education, Balance training, Gait training, Patient/Family education, Self Care, Joint mobilization, Stair training, Electrical stimulation, Cryotherapy, Moist heat, Vasopneumatic device, Manual therapy, and Re-evaluation  PLAN FOR NEXT SESSION: nustep, focus on knee extension (quad set, LAQ, SLR, and TKE), manual therapy, and modalities as needed   Darlin Coco, PT 08/17/2022, 3:43 PM

## 2022-08-20 ENCOUNTER — Ambulatory Visit: Payer: Commercial Managed Care - PPO

## 2022-08-20 DIAGNOSIS — M25562 Pain in left knee: Secondary | ICD-10-CM | POA: Diagnosis not present

## 2022-08-20 DIAGNOSIS — R262 Difficulty in walking, not elsewhere classified: Secondary | ICD-10-CM

## 2022-08-20 DIAGNOSIS — M25662 Stiffness of left knee, not elsewhere classified: Secondary | ICD-10-CM

## 2022-08-20 NOTE — Therapy (Signed)
OUTPATIENT PHYSICAL THERAPY LOWER EXTREMITY TREATMENT   Patient Name: Justin Pennington MRN: [id number] DOB:05/22/1961, 62 y.o., male Today's Date: 08/20/2022  END OF SESSION:  PT End of Session - 08/20/22 1127     Visit Number 8    Number of Visits 12    Date for PT Re-Evaluation 08/28/22    PT Start Time 1115    PT Stop Time 1210    PT Time Calculation (min) 55 min    Activity Tolerance Patient tolerated treatment well    Behavior During Therapy WFL for tasks assessed/performed               Past Medical History:  Diagnosis Date   Allergic conjunctivitis    Chronic obstructive asthma, unspecified    History of adenomatous polyp of colon 2005   Less than 1 cm   Hyperlipidemia    IBS (irritable bowel syndrome)    Diarrhea predominant   Insomnia, unspecified    Past Surgical History:  Procedure Laterality Date   COLONOSCOPY     GINGIVAL GRAFT     LEFT MENISCUS TEAR -ARTHROSCOPIC REPAIR     Patient Active Problem List   Diagnosis Date Noted   Primary osteoarthritis of left knee 03/20/2022   Elevated coronary artery calcium score 02/10/2022   Essential hypertension 02/10/2022   Nonspecific abnormal electrocardiogram (ECG) (EKG) 01/12/2022   SOB (shortness of breath) 01/12/2022   Polymyalgia rheumatica ([place]) 11/21/2021   Patellofemoral syndrome of left knee 08/12/2021   Impingement syndrome of left shoulder region 08/12/2021   Adhesive capsulitis of left shoulder 08/12/2021   Pain in joint of left shoulder 06/05/2021   Pain in joint of left knee 06/05/2021   Hyperlipemia 06/28/2013   BPH (benign prostatic hyperplasia) 06/28/2013   Seasonal and perennial allergic rhinitis 09/05/2010   Allergic conjunctivitis and rhinitis 06/12/2007   Chronic obstructive airway disease with asthma ([place]) 06/12/2007   INSOMNIA UNSPECIFIED 06/12/2007   IRRITABLE BOWEL SYNDROME, HX OF 06/12/2007   REFERRING PROVIDER: Sydnee Cabal, MD   REFERRING DIAG: Unilateral primary  osteoarthritis, left knee   THERAPY DIAG:  Acute pain of left knee  Stiffness of left knee, not elsewhere classified  Difficulty in walking, not elsewhere classified  Rationale for Evaluation and Treatment: Rehabilitation  ONSET DATE: 07/23/22  SUBJECTIVE:   SUBJECTIVE STATEMENT: Patient reports that his calf was sore after his last appointment, but it is better today.   PERTINENT HISTORY: Hypertension, asthma, and osteoarthritis PAIN:  Are you having pain? Yes: NPRS scale: 2-3/10 Pain location: left knee Pain description: sharp, stabbing, quick, pulling Aggravating factors: sitting still Relieving factors: ice and medication  PRECAUTIONS: Knee  WEIGHT BEARING RESTRICTIONS: No  FALLS:  Has patient fallen in last 6 months? 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); stooping, kneeling, crawling, prolonged standing and walking, lifting and moving a truck tire  PLOF: Independent  PATIENT GOALS: return to work, play golf, reduced pain, and walk without an assistive device   NEXT MD VISIT: 08/07/22  OBJECTIVE: all objective assessments were completed at his initial evaluation on 07/29/22 unless otherwise noted  PATIENT SURVEYS:  FOTO 62.78 on 08/20/22  COGNITION: Overall cognitive status: Within functional limits for tasks assessed     SENSATION: Patient reports no numbness or tingling  PALPATION: TTP: left quadriceps   LOWER EXTREMITY ROM:  Active  ROM Right eval Left eval Left 08/06/22 Progress report Left 08/20/22  Hip flexion      Hip extension      Hip abduction      Hip adduction      Hip internal rotation      Hip external rotation      Knee flexion 138 80/87 (PROM)  85/89 (PROM) 90 (AROM)  Knee extension 0 27 15 13  $ Ankle dorsiflexion      Ankle plantarflexion       Ankle inversion      Ankle eversion       (Blank rows = not tested)  LOWER EXTREMITY SPECIAL TESTS:  Not tested due to surgical condition  GAIT: Assistive device utilized: Environmental consultant - 2 wheeled Level of assistance: Modified independence Comments: Decreased gait speed, left knee flexed in stance phase, decreased stance time on left lower extremity, and decreased stride length   TODAY'S TREATMENT:                                                                                                                              DATE:                                     2/15 EXERCISE LOG  Exercise Repetitions and Resistance Comments  Nustep  L3 x 15 minutes; seat 7   Gastroc stretch  4 x 30 seconds   Standing HS stretch  4 x 30 seconds    Eccentric heel tap  6" step x 20 reps   LAQ 3# x 3 minutes    Blank cell = exercise not performed today  Manual Therapy Soft Tissue Mobilization: left quadriceps, for improved soft tissue extensibility Joint Mobilizations: patellar , grade I-IV  Passive ROM: flexion and extension, to tolerance   Modalities  Date:  Vaso: Knee, 34 degrees; low pressure, 10 mins, Pain and Edema                                   2/12 EXERCISE LOG  Exercise Repetitions and Resistance Comments  Nustep  L3 x 17 minutes; seat 8-6   Marching on foam  2 minutes    LAQ 2# x 20 reps    Rocker board  3.5 minutes    Sit to stand  10 reps  From lowered mat table  Eccentric heel tap  4" step x 25 reps     Blank cell = exercise not performed today  Manual Therapy Soft Tissue Mobilization: left quadriceps, for reduced pain and tone  Joint Mobilizations: patellar, grade I-IV  Passive ROM: Flexion and extension, with overpressure to tolerance   Modalities  Date:  Vaso: Knee, 34 degrees; medium pressure, 10 mins, Pain and Edema  2/9 EXERCISE LOG  Exercise Repetitions and Resistance Comments  Nustep  L3 x 15 minutes; seat 7-6   Gastroc  stretch  4 x 30 seconds    Lunges onto step  8" step x 30 reps    Tandem on foam  4 x 30 seconds each    LAQ 25 reps    SLR 20 reps     Blank cell = exercise not performed today  Manual Therapy Soft Tissue Mobilization: left quadriceps, for reduced pain and tone Joint Mobilizations: patellar, grade I-III for reduced pain and improved mobility  Passive ROM: flexion and extension, to tolerance   Modalities  Date:  Vaso: Knee, 34 degrees; medium pressure, 15 mins, Pain and Edema  PATIENT EDUCATION:  Education details: healing, benefits of walking and exercise Person educated: Patient and Spouse Education method: Explanation Education comprehension: verbalized understanding  HOME EXERCISE PROGRAM: Reviewed HEP provided by referring physician  ASSESSMENT:  CLINICAL IMPRESSION: Treatment focused on familiar interventions for improved knee mobility with moderate difficulty. He required minimal cueing with long arc quads to isolate quadriceps engagement and limit hip flexion. Manual therapy focused on improved knee mobility through the use of patellar joint mobilizations and soft tissue mobilization to the quadriceps followed by PROM. He reported that his knee felt better upon the conclusion of treatment. He continues to require skilled physical therapy to address his remaining impairments to return to his prior level of function.   OBJECTIVE IMPAIRMENTS: Abnormal gait, decreased activity tolerance, decreased mobility, difficulty walking, decreased ROM, decreased strength, hypomobility, increased edema, impaired flexibility, impaired tone, and pain.   ACTIVITY LIMITATIONS: carrying, lifting, standing, squatting, stairs, transfers, bed mobility, bathing, dressing, and locomotion level  PARTICIPATION LIMITATIONS: meal prep, cleaning, laundry, driving, shopping, community activity, occupation, and yard work  PERSONAL FACTORS: Profession, Time since onset of injury/illness/exacerbation, and 3+  comorbidities: Hypertension, asthma, and osteoarthritis  are also affecting patient's functional outcome.   REHAB POTENTIAL: Good  CLINICAL DECISION MAKING: Stable/uncomplicated  EVALUATION COMPLEXITY: Low   GOALS: Goals reviewed with patient? Yes  LONG TERM GOALS: Target date: 08/26/22  Patient will be independent with his HEP.  Baseline:  Goal status: IN PROGRESS  2.  Patient will be able to demonstrate at least 120 degrees of active left knee flexion for improved function navigating stairs.  Baseline:  Goal status: IN PROGRESS  3.  Patient will be able to demonstrate active left knee extension within 5 degrees of neutral for improved gait mechanics.  Baseline:  Goal status: IN PROGRESS  4.  Patient will be able to safely ambulate at least 80 feet without an assistive device for improved household mobility.  Baseline:  Goal status: MET  5.  Patient will be able to navigate at least 3 steps with a reciprocal pattern for improved household and community mobility.  Baseline:  Goal status: IN PROGRESS  PLAN:  PT FREQUENCY: 2-3x/week  PT DURATION: 4 weeks  PLANNED INTERVENTIONS: Therapeutic exercises, Therapeutic activity, Neuromuscular re-education, Balance training, Gait training, Patient/Family education, Self Care, Joint mobilization, Stair training, Electrical stimulation, Cryotherapy, Moist heat, Vasopneumatic device, Manual therapy, and Re-evaluation  PLAN FOR NEXT SESSION: nustep, focus on knee extension (quad set, LAQ, SLR, and TKE), manual therapy, and modalities as needed   Darlin Coco, PT 08/20/2022, 12:50 PM

## 2022-08-25 ENCOUNTER — Ambulatory Visit: Payer: Commercial Managed Care - PPO | Admitting: Physical Therapy

## 2022-08-25 ENCOUNTER — Encounter: Payer: Self-pay | Admitting: Physical Therapy

## 2022-08-25 DIAGNOSIS — M25562 Pain in left knee: Secondary | ICD-10-CM

## 2022-08-25 DIAGNOSIS — M25662 Stiffness of left knee, not elsewhere classified: Secondary | ICD-10-CM

## 2022-08-25 DIAGNOSIS — R262 Difficulty in walking, not elsewhere classified: Secondary | ICD-10-CM

## 2022-08-25 NOTE — Therapy (Signed)
OUTPATIENT PHYSICAL THERAPY LOWER EXTREMITY TREATMENT   Patient Name: Justin Pennington MRN: [id number] DOB:01/06/1961, 62 y.o., male Today's Date: 08/25/2022  END OF SESSION:  PT End of Session - 08/25/22 1352     Visit Number 9    Number of Visits 12    Date for PT Re-Evaluation 08/28/22    PT Start Time [id number]    PT Stop Time [id number]    PT Time Calculation (min) 46 min    Activity Tolerance Patient tolerated treatment well    Behavior During Therapy WFL for tasks assessed/performed            Past Medical History:  Diagnosis Date   Allergic conjunctivitis    Chronic obstructive asthma, unspecified    History of adenomatous polyp of colon 2005   Less than 1 cm   Hyperlipidemia    IBS (irritable bowel syndrome)    Diarrhea predominant   Insomnia, unspecified    Past Surgical History:  Procedure Laterality Date   COLONOSCOPY     GINGIVAL GRAFT     LEFT MENISCUS TEAR -ARTHROSCOPIC REPAIR     Patient Active Problem List   Diagnosis Date Noted   Primary osteoarthritis of left knee 03/20/2022   Elevated coronary artery calcium score 02/10/2022   Essential hypertension 02/10/2022   Nonspecific abnormal electrocardiogram (ECG) (EKG) 01/12/2022   SOB (shortness of breath) 01/12/2022   Polymyalgia rheumatica ([place]) 11/21/2021   Patellofemoral syndrome of left knee 08/12/2021   Impingement syndrome of left shoulder region 08/12/2021   Adhesive capsulitis of left shoulder 08/12/2021   Pain in joint of left shoulder 06/05/2021   Pain in joint of left knee 06/05/2021   Hyperlipemia 06/28/2013   BPH (benign prostatic hyperplasia) 06/28/2013   Seasonal and perennial allergic rhinitis 09/05/2010   Allergic conjunctivitis and rhinitis 06/12/2007   Chronic obstructive airway disease with asthma ([place]) 06/12/2007   INSOMNIA UNSPECIFIED 06/12/2007   IRRITABLE BOWEL SYNDROME, HX OF 06/12/2007   REFERRING PROVIDER: Sydnee Cabal, MD   REFERRING DIAG: Unilateral primary osteoarthritis,  left knee   THERAPY DIAG:  Acute pain of left knee  Stiffness of left knee, not elsewhere classified  Difficulty in walking, not elsewhere classified  Rationale for Evaluation and Treatment: Rehabilitation  ONSET DATE: 07/23/22  SUBJECTIVE:   SUBJECTIVE STATEMENT: Not sleeping well. Worked all day yesterday   PERTINENT HISTORY: Hypertension, asthma, and osteoarthritis PAIN:  Are you having pain? Yes: NPRS scale: 3/10 Pain location: left knee Pain description: sharp, stabbing, quick, pulling Aggravating factors: sitting still Relieving factors: ice and medication  PRECAUTIONS: Knee  PATIENT GOALS: return to work, play golf, reduced pain, and walk without an assistive device   NEXT MD VISIT: 08/07/22  OBJECTIVE: all objective assessments were completed at his initial evaluation on 07/29/22 unless otherwise noted  PATIENT SURVEYS:  FOTO 62.78 on 08/20/22  LOWER EXTREMITY ROM:  Active ROM Right eval Left eval Left 08/06/22 Progress report Left 08/20/22 Lef  Hip flexion       Hip extension       Hip abduction       Hip adduction       Hip internal rotation       Hip external rotation       Knee flexion 138 80/87 (PROM)  85/89 (PROM) 90 (AROM) 92  Knee extension 0 27 15 13 12  $ Ankle dorsiflexion       Ankle plantarflexion       Ankle inversion  Ankle eversion        (Blank rows = not tested)  LOWER EXTREMITY SPECIAL TESTS:  Not tested due to surgical condition  TODAY'S TREATMENT:                                                                                                                              DATE:                        EXERCISE LOG  Exercise Repetitions and Resistance Comments  Nustep L4, seat 8-7 x16 min   Rockerboard  X4 min   Lunges 8" step x20 reps   Retro weight shift for L knee extension X10 reps   L runner's stretch 5x10 sec   Seated PROM knee flexion with tibial distraction and IR X3 min    Blank cell = exercise not performed today    Modalities  Date: 08/25/22 Vaso: Knee, Med, 15 mins, Pain                                   2/15 EXERCISE LOG  Exercise Repetitions and Resistance Comments  Nustep  L3 x 15 minutes; seat 7   Gastroc stretch  4 x 30 seconds   Standing HS stretch  4 x 30 seconds    Eccentric heel tap  6" step x 20 reps   LAQ 3# x 3 minutes    Blank cell = exercise not performed today  Manual Therapy Soft Tissue Mobilization: left quadriceps, for improved soft tissue extensibility Joint Mobilizations: patellar , grade I-IV  Passive ROM: flexion and extension, to tolerance   Modalities  Date:  Vaso: Knee, 34 degrees; low pressure, 10 mins, Pain and Edema  PATIENT EDUCATION:  Education details: healing, benefits of walking and exercise Person educated: Patient and Spouse Education method: Explanation Education comprehension: verbalized understanding  HOME EXERCISE PROGRAM: Reviewed HEP provided by referring physician  ASSESSMENT:  CLINICAL IMPRESSION: Patient presented in clinic with reports of mild L knee pain but knee ROM limitations. Patient able to tolerate all therex well with pain with knee extension stretching. Patient educated on prone hang technique for home to help improve knee extension. New stretching and PROM technique added today for ROM improvements. Normal vasopnuematic response noted following removal of the modality.  OBJECTIVE IMPAIRMENTS: Abnormal gait, decreased activity tolerance, decreased mobility, difficulty walking, decreased ROM, decreased strength, hypomobility, increased edema, impaired flexibility, impaired tone, and pain.   ACTIVITY LIMITATIONS: carrying, lifting, standing, squatting, stairs, transfers, bed mobility, bathing, dressing, and locomotion level  PARTICIPATION LIMITATIONS: meal prep, cleaning, laundry, driving, shopping, community activity, occupation, and yard work  PERSONAL FACTORS: Profession, Time since onset of injury/illness/exacerbation, and 3+  comorbidities: Hypertension, asthma, and osteoarthritis  are also affecting patient's functional outcome.   REHAB POTENTIAL: Good  CLINICAL DECISION MAKING: Stable/uncomplicated  EVALUATION COMPLEXITY: Low  GOALS:  Goals reviewed with patient? Yes  LONG TERM GOALS: Target date: 08/26/22  Patient will be independent with his HEP.  Baseline:  Goal status: IN PROGRESS  2.  Patient will be able to demonstrate at least 120 degrees of active left knee flexion for improved function navigating stairs.  Baseline:  Goal status: IN PROGRESS  3.  Patient will be able to demonstrate active left knee extension within 5 degrees of neutral for improved gait mechanics.  Baseline:  Goal status: IN PROGRESS  4.  Patient will be able to safely ambulate at least 80 feet without an assistive device for improved household mobility.  Baseline:  Goal status: MET  5.  Patient will be able to navigate at least 3 steps with a reciprocal pattern for improved household and community mobility.  Baseline:  Goal status: IN PROGRESS  PLAN:  PT FREQUENCY: 2-3x/week  PT DURATION: 4 weeks  PLANNED INTERVENTIONS: Therapeutic exercises, Therapeutic activity, Neuromuscular re-education, Balance training, Gait training, Patient/Family education, Self Care, Joint mobilization, Stair training, Electrical stimulation, Cryotherapy, Moist heat, Vasopneumatic device, Manual therapy, and Re-evaluation  PLAN FOR NEXT SESSION: nustep, focus on knee extension (quad set, LAQ, SLR, and TKE), manual therapy, and modalities as needed  Standley Brooking, PTA 08/25/2022, 2:55 PM

## 2022-08-28 ENCOUNTER — Ambulatory Visit: Payer: Commercial Managed Care - PPO

## 2022-08-28 DIAGNOSIS — M25562 Pain in left knee: Secondary | ICD-10-CM

## 2022-08-28 DIAGNOSIS — M25662 Stiffness of left knee, not elsewhere classified: Secondary | ICD-10-CM

## 2022-08-28 DIAGNOSIS — R262 Difficulty in walking, not elsewhere classified: Secondary | ICD-10-CM

## 2022-08-28 NOTE — Therapy (Addendum)
OUTPATIENT PHYSICAL THERAPY LOWER EXTREMITY TREATMENT   Patient Name: Justin Pennington MRN: [id number] DOB:08/05/1960, 62 y.o., male Today's Date: 08/28/2022  END OF SESSION:  PT End of Session - 08/28/22 0817     Visit Number 10    Number of Visits 12    Date for PT Re-Evaluation 08/28/22    PT Start Time 0815    PT Stop Time 0914    PT Time Calculation (min) 59 min    Activity Tolerance Patient tolerated treatment well    Behavior During Therapy [facility] for tasks assessed/performed            Past Medical History:  Diagnosis Date   Allergic conjunctivitis    Chronic obstructive asthma, unspecified    History of adenomatous polyp of colon 2005   Less than 1 cm   Hyperlipidemia    IBS (irritable bowel syndrome)    Diarrhea predominant   Insomnia, unspecified    Past Surgical History:  Procedure Laterality Date   COLONOSCOPY     GINGIVAL GRAFT     LEFT MENISCUS TEAR -ARTHROSCOPIC REPAIR     Patient Active Problem List   Diagnosis Date Noted   Primary osteoarthritis of left knee 03/20/2022   Elevated coronary artery calcium score 02/10/2022   Essential hypertension 02/10/2022   Nonspecific abnormal electrocardiogram (ECG) (EKG) 01/12/2022   SOB (shortness of breath) 01/12/2022   Polymyalgia rheumatica ([place]) 11/21/2021   Patellofemoral syndrome of left knee 08/12/2021   Impingement syndrome of left shoulder region 08/12/2021   Adhesive capsulitis of left shoulder 08/12/2021   Pain in joint of left shoulder 06/05/2021   Pain in joint of left knee 06/05/2021   Hyperlipemia 06/28/2013   BPH (benign prostatic hyperplasia) 06/28/2013   Seasonal and perennial allergic rhinitis 09/05/2010   Allergic conjunctivitis and rhinitis 06/12/2007   Chronic obstructive airway disease with asthma ([place]) 06/12/2007   INSOMNIA UNSPECIFIED 06/12/2007   IRRITABLE BOWEL SYNDROME, HX OF 06/12/2007   REFERRING PROVIDER: Sydnee Cabal, MD   REFERRING DIAG: Unilateral primary  osteoarthritis, left knee   THERAPY DIAG:  Acute pain of left knee  Stiffness of left knee, not elsewhere classified  Difficulty in walking, not elsewhere classified  Rationale for Evaluation and Treatment: Rehabilitation  ONSET DATE: 07/23/22  SUBJECTIVE:   SUBJECTIVE STATEMENT: Played golf last Friday.  PERTINENT HISTORY: Hypertension, asthma, and osteoarthritis PAIN:  Are you having pain? Yes: NPRS scale: 1-2/10 Pain location: left knee Pain description: sharp, stabbing, quick, pulling Aggravating factors: sitting still Relieving factors: ice and medication  PRECAUTIONS: Knee  PATIENT GOALS: return to work, play golf, reduced pain, and walk without an assistive device   NEXT MD VISIT: 08/07/22  OBJECTIVE: all objective assessments were completed at his initial evaluation on 07/29/22 unless otherwise noted  PATIENT SURVEYS:  FOTO 62.78 on 08/20/22  LOWER EXTREMITY ROM:  Active ROM Right eval Left eval Left 08/06/22 Progress report Left 08/20/22 Lef  Hip flexion       Hip extension       Hip abduction       Hip adduction       Hip internal rotation       Hip external rotation       Knee flexion 138 80/87 (PROM)  85/89 (PROM) 90 (AROM) 92  Knee extension 0 '27 15 13 12  '$ Ankle dorsiflexion       Ankle plantarflexion       Ankle inversion       Ankle eversion        (  Blank rows = not tested)  LOWER EXTREMITY SPECIAL TESTS:  Not tested due to surgical condition  TODAY'S TREATMENT:                                                                                                                              DATE:                       2/23 EXERCISE LOG  Exercise Repetitions and Resistance Comments  Nustep L4, seat 8-6 x15 min   Rockerboard  X4 min   Lunges 14" step x2 mins   Forward Step Ups 6" step x 20 reps    LAQs 3# x 3 mins   Seated marches 3# x 20 reps   Ball Squeezes X2 mins   Heel Slides X 2 mins   Ham Curls Red x 20 reps    Blank cell = exercise not  performed today   Modalities  Date: 08/28/22 Vaso: Knee, Med, 15 mins, Pain                                   2/15 EXERCISE LOG  Exercise Repetitions and Resistance Comments  Nustep  L3 x 15 minutes; seat 7   Gastroc stretch  4 x 30 seconds   Standing HS stretch  4 x 30 seconds    Eccentric heel tap  6" step x 20 reps   LAQ 3# x 3 minutes    Blank cell = exercise not performed today  Manual Therapy Soft Tissue Mobilization: left quadriceps, for improved soft tissue extensibility Joint Mobilizations: patellar , grade I-IV  Passive ROM: flexion and extension, to tolerance   Modalities  Date:  Vaso: Knee, 34 degrees; low pressure, 10 mins, Pain and Edema  PATIENT EDUCATION:  Education details: healing, benefits of walking and exercise Person educated: Patient and Spouse Education method: Explanation Education comprehension: verbalized understanding  HOME EXERCISE PROGRAM: Reviewed HEP provided by referring physician  ASSESSMENT:  CLINICAL IMPRESSION: Pt arrives for today's treatment session reporting 1-2/10 left knee pain.  Pt able to progress to seat 6 on Nustep today without pain. Pt able to demonstrate 91 degrees of active left knee flexion today and -20 of active left knee extension.  Pt introduced to new seated and standing exercises today with min cues required for proper technique and posture.  Pt tends to lean to right with seated marches.  Normal responses to vaso noted upon removal.  Pt is making good progress towards his goals at this time.  Pt denied any change in pain at completion of today's treatment session.  08/28/22 PROGRESS REPORT: Patient continues to make fair progress with skilled physical therapy as evidenced by his objective measures, functional mobility, and progress towards his goals. He continues to exhibit increased left knee stiffness as evidenced by his reduced left knee extension today compared  to previous appointments.  He also continues to  experience difficulty with functional activities such as descending steps due to pain and difficulty with eccentric quadriceps control. Recommend that he continue with skilled physical therapy to address his remaining impairments to return to his prior level of function.  Jacqulynn Cadet, PT, DPT   OBJECTIVE IMPAIRMENTS: Abnormal gait, decreased activity tolerance, decreased mobility, difficulty walking, decreased ROM, decreased strength, hypomobility, increased edema, impaired flexibility, impaired tone, and pain.   ACTIVITY LIMITATIONS: carrying, lifting, standing, squatting, stairs, transfers, bed mobility, bathing, dressing, and locomotion level  PARTICIPATION LIMITATIONS: meal prep, cleaning, laundry, driving, shopping, community activity, occupation, and yard work  PERSONAL FACTORS: Profession, Time since onset of injury/illness/exacerbation, and 3+ comorbidities: Hypertension, asthma, and osteoarthritis  are also affecting patient's functional outcome.   REHAB POTENTIAL: Good  CLINICAL DECISION MAKING: Stable/uncomplicated  EVALUATION COMPLEXITY: Low  GOALS: Goals reviewed with patient? Yes  LONG TERM GOALS: Target date: 08/26/22  Patient will be independent with his HEP.  Baseline:  Goal status: MET  2.  Patient will be able to demonstrate at least 120 degrees of active left knee flexion for improved function navigating stairs.  Baseline: 2/23: 91 degrees Goal status: IN PROGRESS  3.  Patient will be able to demonstrate active left knee extension within 5 degrees of neutral for improved gait mechanics.  Baseline: 2/23: -20 degrees Goal status: IN PROGRESS  4.  Patient will be able to safely ambulate at least 80 feet without an assistive device for improved household mobility.  Baseline:  Goal status: MET  5.  Patient will be able to navigate at least 3 steps with a reciprocal pattern for improved household and community mobility.  Baseline:  Goal status: IN  PROGRESS  PLAN:  PT FREQUENCY: 2-3x/week  PT DURATION: 4 weeks  PLANNED INTERVENTIONS: Therapeutic exercises, Therapeutic activity, Neuromuscular re-education, Balance training, Gait training, Patient/Family education, Self Care, Joint mobilization, Stair training, Electrical stimulation, Cryotherapy, Moist heat, Vasopneumatic device, Manual therapy, and Re-evaluation  PLAN FOR NEXT SESSION: nustep, focus on knee extension (quad set, LAQ, SLR, and TKE), manual therapy, and modalities as needed  Kathrynn Ducking, PTA 08/28/2022, 9:50 AM

## 2022-09-01 ENCOUNTER — Ambulatory Visit: Payer: Commercial Managed Care - PPO

## 2022-09-01 DIAGNOSIS — M25562 Pain in left knee: Secondary | ICD-10-CM

## 2022-09-01 DIAGNOSIS — M25662 Stiffness of left knee, not elsewhere classified: Secondary | ICD-10-CM

## 2022-09-01 DIAGNOSIS — R262 Difficulty in walking, not elsewhere classified: Secondary | ICD-10-CM

## 2022-09-01 NOTE — Therapy (Signed)
OUTPATIENT PHYSICAL THERAPY LOWER EXTREMITY TREATMENT   Patient Name: Justin Pennington MRN: [id number] DOB:03/14/1961, 62 y.o., male Today's Date: 09/01/2022  END OF SESSION:  PT End of Session - 09/01/22 0819     Visit Number 11    Number of Visits 18    Date for PT Re-Evaluation 10/02/22    PT Start Time 0815    PT Stop Time 0908    PT Time Calculation (min) 53 min    Activity Tolerance Patient tolerated treatment well    Behavior During Therapy WFL for tasks assessed/performed            Past Medical History:  Diagnosis Date   Allergic conjunctivitis    Chronic obstructive asthma, unspecified    History of adenomatous polyp of colon 2005   Less than 1 cm   Hyperlipidemia    IBS (irritable bowel syndrome)    Diarrhea predominant   Insomnia, unspecified    Past Surgical History:  Procedure Laterality Date   COLONOSCOPY     GINGIVAL GRAFT     LEFT MENISCUS TEAR -ARTHROSCOPIC REPAIR     Patient Active Problem List   Diagnosis Date Noted   Primary osteoarthritis of left knee 03/20/2022   Elevated coronary artery calcium score 02/10/2022   Essential hypertension 02/10/2022   Nonspecific abnormal electrocardiogram (ECG) (EKG) 01/12/2022   SOB (shortness of breath) 01/12/2022   Polymyalgia rheumatica ([place]) 11/21/2021   Patellofemoral syndrome of left knee 08/12/2021   Impingement syndrome of left shoulder region 08/12/2021   Adhesive capsulitis of left shoulder 08/12/2021   Pain in joint of left shoulder 06/05/2021   Pain in joint of left knee 06/05/2021   Hyperlipemia 06/28/2013   BPH (benign prostatic hyperplasia) 06/28/2013   Seasonal and perennial allergic rhinitis 09/05/2010   Allergic conjunctivitis and rhinitis 06/12/2007   Chronic obstructive airway disease with asthma ([place]) 06/12/2007   INSOMNIA UNSPECIFIED 06/12/2007   IRRITABLE BOWEL SYNDROME, HX OF 06/12/2007   REFERRING PROVIDER: Sydnee Cabal, MD   REFERRING DIAG: Unilateral primary  osteoarthritis, left knee   THERAPY DIAG:  Acute pain of left knee  Stiffness of left knee, not elsewhere classified  Difficulty in walking, not elsewhere classified  Rationale for Evaluation and Treatment: Rehabilitation  ONSET DATE: 07/23/22  SUBJECTIVE:   SUBJECTIVE STATEMENT: Patient reports that his knee feels good today. He has noticed that he can go down steps easier now. However, he is getting concerned over his knee straightening.   PERTINENT HISTORY: Hypertension, asthma, and osteoarthritis PAIN:  Are you having pain? Yes: NPRS scale: 1-2/10 Pain location: left knee Pain description: sharp, stabbing, quick, pulling Aggravating factors: sitting still Relieving factors: ice and medication  PRECAUTIONS: Knee  PATIENT GOALS: return to work, play golf, reduced pain, and walk without an assistive device   NEXT MD VISIT: 09/07/22  OBJECTIVE: all objective assessments were completed at his initial evaluation on 07/29/22 unless otherwise noted  PATIENT SURVEYS:  FOTO 62.78 on 08/20/22  LOWER EXTREMITY ROM:  Active ROM Right eval Left eval Left 08/06/22 Progress report Left 08/20/22 Lef  Hip flexion       Hip extension       Hip abduction       Hip adduction       Hip internal rotation       Hip external rotation       Knee flexion 138 80/87 (PROM)  85/89 (PROM) 90 (AROM) 92  Knee extension 0 '27 15 13 12  '$ Ankle dorsiflexion  Ankle plantarflexion       Ankle inversion       Ankle eversion        (Blank rows = not tested)  LOWER EXTREMITY SPECIAL TESTS:  Not tested due to surgical condition  TODAY'S TREATMENT:                                                                                                                              DATE:                                  2/27 EXERCISE LOG  Exercise Repetitions and Resistance Comments  Nustep  L4 x 15 minutes; seat 7-6   Recumbent bike  Able to perform full revolutions, but limited due to pain   Gastroc  stretch  4 x 30 seconds   Standing HS stretch 4 x 30 seconds   LAQ 3# x 3 minutes    Prone hang  2# at ankle x 6.5 minutes Moist heat to hamstring   Blank cell = exercise not performed today  Modalities  Date:  Vaso: Knee, 34 degrees; low pressure, 10 mins, Pain and Edema Hot Pack: left hamstring, 6 mins, for soft tissue extensibility during prone hang                         2/23 EXERCISE LOG  Exercise Repetitions and Resistance Comments  Nustep L4, seat 8-6 x15 min   Rockerboard  X4 min   Lunges 14" step x2 mins   Forward Step Ups 6" step x 20 reps    LAQs 3# x 3 mins   Seated marches 3# x 20 reps   Ball Squeezes X2 mins   Heel Slides X 2 mins   Ham Curls Red x 20 reps    Blank cell = exercise not performed today   Modalities  Date: 08/28/22 Vaso: Knee, Med, 15 mins, Pain                                   2/15 EXERCISE LOG  Exercise Repetitions and Resistance Comments  Nustep  L3 x 15 minutes; seat 7   Gastroc stretch  4 x 30 seconds   Standing HS stretch  4 x 30 seconds    Eccentric heel tap  6" step x 20 reps   LAQ 3# x 3 minutes    Blank cell = exercise not performed today  Manual Therapy Soft Tissue Mobilization: left quadriceps, for improved soft tissue extensibility Joint Mobilizations: patellar , grade I-IV  Passive ROM: flexion and extension, to tolerance   Modalities  Date:  Vaso: Knee, 34 degrees; low pressure, 10 mins, Pain and Edema  PATIENT EDUCATION:  Education details: healing, benefits of walking and exercise Person educated: Patient and Spouse  Education method: Explanation Education comprehension: verbalized understanding  HOME EXERCISE PROGRAM: Reviewed HEP provided by referring physician  ASSESSMENT:  CLINICAL IMPRESSION: Treatment focused on improved knee extension through the use of new and familiar interventions. He required minimal cueing with today's interventions for proper positioning to facilitate improved soft tissue  extensibility.. The recumbent bike was attempted and he was able to complete full revolutions, but this was stopped due to a significant increase in left knee pain. He reported that his knee felt good upon the conclusion of treatment. He continues to require skilled physical therapy to address his remaining impairments to return to his prior level of function.   OBJECTIVE IMPAIRMENTS: Abnormal gait, decreased activity tolerance, decreased mobility, difficulty walking, decreased ROM, decreased strength, hypomobility, increased edema, impaired flexibility, impaired tone, and pain.   ACTIVITY LIMITATIONS: carrying, lifting, standing, squatting, stairs, transfers, bed mobility, bathing, dressing, and locomotion level  PARTICIPATION LIMITATIONS: meal prep, cleaning, laundry, driving, shopping, community activity, occupation, and yard work  PERSONAL FACTORS: Profession, Time since onset of injury/illness/exacerbation, and 3+ comorbidities: Hypertension, asthma, and osteoarthritis  are also affecting patient's functional outcome.   REHAB POTENTIAL: Good  CLINICAL DECISION MAKING: Stable/uncomplicated  EVALUATION COMPLEXITY: Low  GOALS: Goals reviewed with patient? Yes  LONG TERM GOALS: Target date: 08/26/22  Patient will be independent with his HEP.  Baseline:  Goal status: MET  2.  Patient will be able to demonstrate at least 120 degrees of active left knee flexion for improved function navigating stairs.  Baseline: 2/23: 91 degrees Goal status: IN PROGRESS  3.  Patient will be able to demonstrate active left knee extension within 5 degrees of neutral for improved gait mechanics.  Baseline: 2/23: -20 degrees Goal status: IN PROGRESS  4.  Patient will be able to safely ambulate at least 80 feet without an assistive device for improved household mobility.  Baseline:  Goal status: MET  5.  Patient will be able to navigate at least 3 steps with a reciprocal pattern for improved household  and community mobility.  Baseline:  Goal status: IN PROGRESS  PLAN:  PT FREQUENCY: 2-3x/week  PT DURATION: 4 weeks  PLANNED INTERVENTIONS: Therapeutic exercises, Therapeutic activity, Neuromuscular re-education, Balance training, Gait training, Patient/Family education, Self Care, Joint mobilization, Stair training, Electrical stimulation, Cryotherapy, Moist heat, Vasopneumatic device, Manual therapy, and Re-evaluation  PLAN FOR NEXT SESSION: nustep, focus on knee extension (quad set, LAQ, SLR, and TKE), manual therapy, and modalities as needed  Darlin Coco, PT 09/01/2022, 10:25 AM

## 2022-09-04 ENCOUNTER — Ambulatory Visit: Payer: Commercial Managed Care - PPO | Attending: Specialist

## 2022-09-04 DIAGNOSIS — M25662 Stiffness of left knee, not elsewhere classified: Secondary | ICD-10-CM | POA: Diagnosis present

## 2022-09-04 DIAGNOSIS — R262 Difficulty in walking, not elsewhere classified: Secondary | ICD-10-CM | POA: Diagnosis present

## 2022-09-04 DIAGNOSIS — M25562 Pain in left knee: Secondary | ICD-10-CM | POA: Diagnosis not present

## 2022-09-04 NOTE — Therapy (Signed)
OUTPATIENT PHYSICAL THERAPY LOWER EXTREMITY TREATMENT   Patient Name: Justin Pennington MRN: [id number] DOB:[birth year], 62 y.o., male Today's Date: 09/04/2022  END OF SESSION:  PT End of Session - 09/04/22 0818     Visit Number 12    Number of Visits 18    Date for PT Re-Evaluation 10/02/22    PT Start Time 0814    PT Stop Time 0917    PT Time Calculation (min) 63 min    Activity Tolerance Patient tolerated treatment well    Behavior During Therapy WFL for tasks assessed/performed            Past Medical History:  Diagnosis Date   Allergic conjunctivitis    Chronic obstructive asthma, unspecified    History of adenomatous polyp of colon 2005   Less than 1 cm   Hyperlipidemia    IBS (irritable bowel syndrome)    Diarrhea predominant   Insomnia, unspecified    Past Surgical History:  Procedure Laterality Date   COLONOSCOPY     GINGIVAL GRAFT     LEFT MENISCUS TEAR -ARTHROSCOPIC REPAIR     Patient Active Problem List   Diagnosis Date Noted   Primary osteoarthritis of left knee 03/20/2022   Elevated coronary artery calcium score 02/10/2022   Essential hypertension 02/10/2022   Nonspecific abnormal electrocardiogram (ECG) (EKG) 01/12/2022   SOB (shortness of breath) 01/12/2022   Polymyalgia rheumatica ([place]) 11/21/2021   Patellofemoral syndrome of left knee 08/12/2021   Impingement syndrome of left shoulder region 08/12/2021   Adhesive capsulitis of left shoulder 08/12/2021   Pain in joint of left shoulder 06/05/2021   Pain in joint of left knee 06/05/2021   Hyperlipemia 06/28/2013   BPH (benign prostatic hyperplasia) 06/28/2013   Seasonal and perennial allergic rhinitis 09/05/2010   Allergic conjunctivitis and rhinitis 06/12/2007   Chronic obstructive airway disease with asthma ([place]) 06/12/2007   INSOMNIA UNSPECIFIED 06/12/2007   IRRITABLE BOWEL SYNDROME, HX OF 06/12/2007   REFERRING PROVIDER: Sydnee Cabal, MD   REFERRING DIAG: Unilateral primary osteoarthritis,  left knee   THERAPY DIAG:  Acute pain of left knee  Stiffness of left knee, not elsewhere classified  Difficulty in walking, not elsewhere classified  Rationale for Evaluation and Treatment: Rehabilitation  ONSET DATE: 07/23/22  SUBJECTIVE:   SUBJECTIVE STATEMENT: Patient reports that his knee is hurting just a little, but not too bad.   PERTINENT HISTORY: Hypertension, asthma, and osteoarthritis PAIN:  Are you having pain? Yes: NPRS scale: 1-2/10 Pain location: left knee Pain description: sharp, stabbing, quick, pulling Aggravating factors: sitting still Relieving factors: ice and medication  PRECAUTIONS: Knee  PATIENT GOALS: return to work, play golf, reduced pain, and walk without an assistive device   NEXT MD VISIT: 09/07/22  OBJECTIVE: all objective assessments were completed at his initial evaluation on 07/29/22 unless otherwise noted  PATIENT SURVEYS:  FOTO 62.78 on 08/20/22  LOWER EXTREMITY ROM:  Active ROM Right eval Left eval Left 08/06/22 Progress report Left 08/20/22 Left 09/04/22  Hip flexion       Hip extension       Hip abduction       Hip adduction       Hip internal rotation       Hip external rotation       Knee flexion 138 80/87 (PROM)  85/89 (PROM) 90 (AROM) 93  Knee extension 0 '27 15 13 11  '$ Ankle dorsiflexion       Ankle plantarflexion  Ankle inversion       Ankle eversion        (Blank rows = not tested)  LOWER EXTREMITY SPECIAL TESTS:  Not tested due to surgical condition  TODAY'S TREATMENT:                                                                                                                              DATE:                                   3/1 EXERCISE LOG  Exercise Repetitions and Resistance Comments  Nustep  L4 x 10 minutes; seat 4   Recumbent bike  L1 x 2 minutes; seat 11 Limited by pain  Gastroc stretch  4 x 30 seconds   Standing TKE  Green t-band x 2.5 minutes   Standing HS stretch  4 x 30 seconds    Blank  cell = exercise not performed today  Manual Therapy Soft Tissue Mobilization: left hamstrings and quadriceps, for improved soft tissue extensibility Joint Mobilizations: tibiofemoral and patellar, grade I-IV  Passive ROM: flexion and extension, to tolerance   Modalities: no redness or adverse reaction to today's modalities  Date:  Unattended Estim: Knee, IFC @ 80-150 Hz w/ 40% scan, 15 mins, Pain Vaso: Knee, 34 degrees; low pressure, 15 mins, Pain                                   2/27 EXERCISE LOG  Exercise Repetitions and Resistance Comments  Nustep  L4 x 15 minutes; seat 7-6   Recumbent bike  Able to perform full revolutions, but limited due to pain   Gastroc stretch  4 x 30 seconds   Standing HS stretch 4 x 30 seconds   LAQ 3# x 3 minutes    Prone hang  2# at ankle x 6.5 minutes Moist heat to hamstring   Blank cell = exercise not performed today  Modalities  Date:  Vaso: Knee, 34 degrees; low pressure, 10 mins, Pain and Edema Hot Pack: left hamstring, 6 mins, for soft tissue extensibility during prone hang                         2/23 EXERCISE LOG  Exercise Repetitions and Resistance Comments  Nustep L4, seat 8-6 x15 min   Rockerboard  X4 min   Lunges 14" step x2 mins   Forward Step Ups 6" step x 20 reps    LAQs 3# x 3 mins   Seated marches 3# x 20 reps   Ball Squeezes X2 mins   Heel Slides X 2 mins   Ham Curls Red x 20 reps    Blank cell = exercise not performed today   Modalities  Date: 08/28/22 Vaso: Knee, Med, 15  mins, Pain  PATIENT EDUCATION:  Education details: healing, benefits of walking and exercise Person educated: Patient and Spouse Education method: Explanation Education comprehension: verbalized understanding  HOME EXERCISE PROGRAM: Reviewed HEP provided by referring physician  ASSESSMENT:  CLINICAL IMPRESSION: Patient was introduced to standing TKE's to facilitate quadriceps engagement needed for improved knee extension. He required minimal  cueing with this intervention for proper positioning to promote improved knee extension. Manual therapy focused on improved knee ROM through the use of soft tissue mobilization to the hamstrings and quadriceps, tibiofemoral and patellar joint mobilizations, and PROM. He reported that his knee felt better after the use of modalities upon the conclusion of treatment. He continues to require skilled physical therapy to address his remaining impairments to return to his prior level of function.   OBJECTIVE IMPAIRMENTS: Abnormal gait, decreased activity tolerance, decreased mobility, difficulty walking, decreased ROM, decreased strength, hypomobility, increased edema, impaired flexibility, impaired tone, and pain.   ACTIVITY LIMITATIONS: carrying, lifting, standing, squatting, stairs, transfers, bed mobility, bathing, dressing, and locomotion level  PARTICIPATION LIMITATIONS: meal prep, cleaning, laundry, driving, shopping, community activity, occupation, and yard work  PERSONAL FACTORS: Profession, Time since onset of injury/illness/exacerbation, and 3+ comorbidities: Hypertension, asthma, and osteoarthritis  are also affecting patient's functional outcome.   REHAB POTENTIAL: Good  CLINICAL DECISION MAKING: Stable/uncomplicated  EVALUATION COMPLEXITY: Low  GOALS: Goals reviewed with patient? Yes  LONG TERM GOALS: Target date: 08/26/22  Patient will be independent with his HEP.  Baseline:  Goal status: MET  2.  Patient will be able to demonstrate at least 120 degrees of active left knee flexion for improved function navigating stairs.  Baseline: 2/23: 91 degrees Goal status: IN PROGRESS  3.  Patient will be able to demonstrate active left knee extension within 5 degrees of neutral for improved gait mechanics.  Baseline: 2/23: -20 degrees Goal status: IN PROGRESS  4.  Patient will be able to safely ambulate at least 80 feet without an assistive device for improved household mobility.   Baseline:  Goal status: MET  5.  Patient will be able to navigate at least 3 steps with a reciprocal pattern for improved household and community mobility.  Baseline:  Goal status: IN PROGRESS  PLAN:  PT FREQUENCY: 2-3x/week  PT DURATION: 4 weeks  PLANNED INTERVENTIONS: Therapeutic exercises, Therapeutic activity, Neuromuscular re-education, Balance training, Gait training, Patient/Family education, Self Care, Joint mobilization, Stair training, Electrical stimulation, Cryotherapy, Moist heat, Vasopneumatic device, Manual therapy, and Re-evaluation  PLAN FOR NEXT SESSION: nustep, focus on knee extension (quad set, LAQ, SLR, and TKE), manual therapy, and modalities as needed  Darlin Coco, PT 09/04/2022, 10:27 AM

## 2022-09-07 LAB — HEPATIC FUNCTION PANEL
ALT: 29 IU/L (ref 0–44)
AST: 18 IU/L (ref 0–40)
Albumin: 4.5 g/dL (ref 3.9–4.9)
Alkaline Phosphatase: 88 IU/L (ref 44–121)
Bilirubin Total: 0.6 mg/dL (ref 0.0–1.2)
Bilirubin, Direct: 0.16 mg/dL (ref 0.00–0.40)
Total Protein: 7.3 g/dL (ref 6.0–8.5)

## 2022-09-07 LAB — LIPID PANEL
Chol/HDL Ratio: 2.8 ratio (ref 0.0–5.0)
Cholesterol, Total: 110 mg/dL (ref 100–199)
HDL: 40 mg/dL (ref >39)
LDL Chol Calc (NIH): 45 mg/dL (ref 0–99)
Triglycerides: 142 mg/dL (ref 0–149)
VLDL Cholesterol Cal: 25 mg/dL (ref 5–40)

## 2022-09-08 ENCOUNTER — Ambulatory Visit: Payer: Commercial Managed Care - PPO | Admitting: Physical Therapy

## 2022-09-08 ENCOUNTER — Encounter: Payer: Self-pay | Admitting: Physical Therapy

## 2022-09-08 DIAGNOSIS — M25562 Pain in left knee: Secondary | ICD-10-CM | POA: Diagnosis not present

## 2022-09-08 DIAGNOSIS — M25662 Stiffness of left knee, not elsewhere classified: Secondary | ICD-10-CM

## 2022-09-08 DIAGNOSIS — R262 Difficulty in walking, not elsewhere classified: Secondary | ICD-10-CM

## 2022-09-08 NOTE — Therapy (Signed)
OUTPATIENT PHYSICAL THERAPY LOWER EXTREMITY TREATMENT   Patient Name: Justin Pennington MRN: [id number] DOB:08/29/1960, 62 y.o., male Today's Date: 09/08/2022  END OF SESSION:  PT End of Session - 09/08/22 0813     Visit Number 13    Number of Visits 18    Date for PT Re-Evaluation 10/02/22    PT Start Time 0817    PT Stop Time 0905    PT Time Calculation (min) 48 min    Activity Tolerance Patient tolerated treatment well    Behavior During Therapy [facility] for tasks assessed/performed            Past Medical History:  Diagnosis Date   Allergic conjunctivitis    Chronic obstructive asthma, unspecified    History of adenomatous polyp of colon 2005   Less than 1 cm   Hyperlipidemia    IBS (irritable bowel syndrome)    Diarrhea predominant   Insomnia, unspecified    Past Surgical History:  Procedure Laterality Date   COLONOSCOPY     GINGIVAL GRAFT     LEFT MENISCUS TEAR -ARTHROSCOPIC REPAIR     Patient Active Problem List   Diagnosis Date Noted   Primary osteoarthritis of left knee 03/20/2022   Elevated coronary artery calcium score 02/10/2022   Essential hypertension 02/10/2022   Nonspecific abnormal electrocardiogram (ECG) (EKG) 01/12/2022   SOB (shortness of breath) 01/12/2022   Polymyalgia rheumatica ([place]) 11/21/2021   Patellofemoral syndrome of left knee 08/12/2021   Impingement syndrome of left shoulder region 08/12/2021   Adhesive capsulitis of left shoulder 08/12/2021   Pain in joint of left shoulder 06/05/2021   Pain in joint of left knee 06/05/2021   Hyperlipemia 06/28/2013   BPH (benign prostatic hyperplasia) 06/28/2013   Seasonal and perennial allergic rhinitis 09/05/2010   Allergic conjunctivitis and rhinitis 06/12/2007   Chronic obstructive airway disease with asthma ([place]) 06/12/2007   INSOMNIA UNSPECIFIED 06/12/2007   IRRITABLE BOWEL SYNDROME, HX OF 06/12/2007   REFERRING PROVIDER: Sydnee Cabal, MD   REFERRING DIAG: Unilateral primary osteoarthritis,  left knee   THERAPY DIAG:  Acute pain of left knee  Stiffness of left knee, not elsewhere classified  Difficulty in walking, not elsewhere classified  Rationale for Evaluation and Treatment: Rehabilitation  ONSET DATE: 07/23/22  SUBJECTIVE:   SUBJECTIVE STATEMENT: Reports that he went to MD yesterday and got a good report. They wanted to put him on steroids for the swelling still in L knee but opted not to as patient has recently just quit steroids after 15 months on them.  PERTINENT HISTORY: Hypertension, asthma, and osteoarthritis PAIN:  Are you having pain? Yes: NPRS scale: 3/10 Pain location: left knee Pain description: sharp, stabbing, quick, pulling Aggravating factors: sitting still Relieving factors: ice and medication  PRECAUTIONS: Knee  PATIENT GOALS: return to work, play golf, reduced pain, and walk without an assistive device   NEXT MD VISIT: 10/2022  OBJECTIVE: all objective assessments were completed at his initial evaluation on 07/29/22 unless otherwise noted  PATIENT SURVEYS:  FOTO 62.78 on 08/20/22  LOWER EXTREMITY ROM:  Active ROM Right eval Left eval Left 08/06/22 Progress report Left 08/20/22 Left 09/04/22  Hip flexion       Hip extension       Hip abduction       Hip adduction       Hip internal rotation       Hip external rotation       Knee flexion 138 80/87 (PROM)  85/89 (PROM) 90 (  AROM) 93  Knee extension 0 '27 15 13 11  '$ Ankle dorsiflexion       Ankle plantarflexion       Ankle inversion       Ankle eversion        (Blank rows = not tested)  LOWER EXTREMITY SPECIAL TESTS:  Not tested due to surgical condition  TODAY'S TREATMENT:                                                                                                                              DATE:                   3/5     EXERCISE LOG  Exercise Repetitions and Resistance Comments  Nustep L3, seat 7-5 x15 min   Slant board stretch 3x30 sec holds   L lunges  14" box x15 reps 5  sec holds   L runner's stretch 8" box x10 reps 5 sec holds   L TKE Green theraband x20 reps 5 sec holds with DF   Leg press 2 pl, seat 6 x20 reps with heel pushoff    Blank cell = exercise not performed today   Manual Therapy Soft Tissue Mobilization: L quad/ ITB, reduce tone to improve ROM    Modalities  Date:  Unattended Estim: Knee, IFC, 10 mins, Pain and Edema Vaso: Knee, Med, 10 mins, Pain and Edema                                   3/1 EXERCISE LOG  Exercise Repetitions and Resistance Comments  Nustep  L4 x 10 minutes; seat 4   Recumbent bike  L1 x 2 minutes; seat 11 Limited by pain  Gastroc stretch  4 x 30 seconds   Standing TKE  Green t-band x 2.5 minutes   Standing HS stretch  4 x 30 seconds    Blank cell = exercise not performed today  Manual Therapy Soft Tissue Mobilization: left hamstrings and quadriceps, for improved soft tissue extensibility Joint Mobilizations: tibiofemoral and patellar, grade I-IV  Passive ROM: flexion and extension, to tolerance   Modalities: no redness or adverse reaction to today's modalities  Date:  Unattended Estim: Knee, IFC @ 80-150 Hz w/ 40% scan, 15 mins, Pain Vaso: Knee, 34 degrees; low pressure, 15 mins, Pain  PATIENT EDUCATION:  Education details: healing, benefits of walking and exercise Person educated: Patient and Spouse Education method: Explanation Education comprehension: verbalized understanding  HOME EXERCISE PROGRAM: Reviewed HEP provided by referring physician  ASSESSMENT:  CLINICAL IMPRESSION: Patient presented in clinic with reports of swelling and tone of the L quad and knee. Patient progressed through ROM focused stretching and exercises due to limitations with knee extension and flexion. Patient had greater tone of the L ITB and new HEP provided for SL EOB stretch with education regarding parameters and technique. Normal  modalities response noted following removal of the modalities.  OBJECTIVE IMPAIRMENTS:  Abnormal gait, decreased activity tolerance, decreased mobility, difficulty walking, decreased ROM, decreased strength, hypomobility, increased edema, impaired flexibility, impaired tone, and pain.   ACTIVITY LIMITATIONS: carrying, lifting, standing, squatting, stairs, transfers, bed mobility, bathing, dressing, and locomotion level  PARTICIPATION LIMITATIONS: meal prep, cleaning, laundry, driving, shopping, community activity, occupation, and yard work  PERSONAL FACTORS: Profession, Time since onset of injury/illness/exacerbation, and 3+ comorbidities: Hypertension, asthma, and osteoarthritis  are also affecting patient's functional outcome.   REHAB POTENTIAL: Good  CLINICAL DECISION MAKING: Stable/uncomplicated  EVALUATION COMPLEXITY: Low  GOALS: Goals reviewed with patient? Yes  LONG TERM GOALS: Target date: 08/26/22  Patient will be independent with his HEP.  Baseline:  Goal status: MET  2.  Patient will be able to demonstrate at least 120 degrees of active left knee flexion for improved function navigating stairs.  Baseline: 2/23: 91 degrees Goal status: IN PROGRESS  3.  Patient will be able to demonstrate active left knee extension within 5 degrees of neutral for improved gait mechanics.  Baseline: 2/23: -20 degrees Goal status: IN PROGRESS  4.  Patient will be able to safely ambulate at least 80 feet without an assistive device for improved household mobility.  Baseline:  Goal status: MET  5.  Patient will be able to navigate at least 3 steps with a reciprocal pattern for improved household and community mobility.  Baseline:  Goal status: IN PROGRESS  PLAN:  PT FREQUENCY: 2-3x/week  PT DURATION: 4 weeks  PLANNED INTERVENTIONS: Therapeutic exercises, Therapeutic activity, Neuromuscular re-education, Balance training, Gait training, Patient/Family education, Self Care, Joint mobilization, Stair training, Electrical stimulation, Cryotherapy, Moist heat, Vasopneumatic  device, Manual therapy, and Re-evaluation  PLAN FOR NEXT SESSION: nustep, focus on knee extension (quad set, LAQ, SLR, and TKE), manual therapy, and modalities as needed  Standley Brooking, PTA 09/08/2022, 9:54 AM

## 2022-09-11 ENCOUNTER — Ambulatory Visit: Payer: Commercial Managed Care - PPO

## 2022-09-11 DIAGNOSIS — R262 Difficulty in walking, not elsewhere classified: Secondary | ICD-10-CM

## 2022-09-11 DIAGNOSIS — M25562 Pain in left knee: Secondary | ICD-10-CM

## 2022-09-11 DIAGNOSIS — M25662 Stiffness of left knee, not elsewhere classified: Secondary | ICD-10-CM

## 2022-09-11 NOTE — Therapy (Signed)
OUTPATIENT PHYSICAL THERAPY LOWER EXTREMITY TREATMENT   Patient Name: Justin Pennington MRN: [id number] DOB:12/28/1960, 62 y.o., male Today's Date: 09/11/2022  END OF SESSION:  PT End of Session - 09/11/22 0820     Visit Number 14    Number of Visits 18    Date for PT Re-Evaluation 10/02/22    PT Start Time 0815    PT Stop Time 0918    PT Time Calculation (min) 63 min    Activity Tolerance Patient tolerated treatment well    Behavior During Therapy WFL for tasks assessed/performed            Past Medical History:  Diagnosis Date   Allergic conjunctivitis    Chronic obstructive asthma, unspecified    History of adenomatous polyp of colon 2005   Less than 1 cm   Hyperlipidemia    IBS (irritable bowel syndrome)    Diarrhea predominant   Insomnia, unspecified    Past Surgical History:  Procedure Laterality Date   COLONOSCOPY     GINGIVAL GRAFT     LEFT MENISCUS TEAR -ARTHROSCOPIC REPAIR     Patient Active Problem List   Diagnosis Date Noted   Primary osteoarthritis of left knee 03/20/2022   Elevated coronary artery calcium score 02/10/2022   Essential hypertension 02/10/2022   Nonspecific abnormal electrocardiogram (ECG) (EKG) 01/12/2022   SOB (shortness of breath) 01/12/2022   Polymyalgia rheumatica ([place]) 11/21/2021   Patellofemoral syndrome of left knee 08/12/2021   Impingement syndrome of left shoulder region 08/12/2021   Adhesive capsulitis of left shoulder 08/12/2021   Pain in joint of left shoulder 06/05/2021   Pain in joint of left knee 06/05/2021   Hyperlipemia 06/28/2013   BPH (benign prostatic hyperplasia) 06/28/2013   Seasonal and perennial allergic rhinitis 09/05/2010   Allergic conjunctivitis and rhinitis 06/12/2007   Chronic obstructive airway disease with asthma ([place]) 06/12/2007   INSOMNIA UNSPECIFIED 06/12/2007   IRRITABLE BOWEL SYNDROME, HX OF 06/12/2007   REFERRING PROVIDER: Sydnee Cabal, MD   REFERRING DIAG: Unilateral primary osteoarthritis,  left knee   THERAPY DIAG:  Acute pain of left knee  Stiffness of left knee, not elsewhere classified  Difficulty in walking, not elsewhere classified  Rationale for Evaluation and Treatment: Rehabilitation  ONSET DATE: 07/23/22  SUBJECTIVE:   SUBJECTIVE STATEMENT: Patient feels that his knee is getting better.   PERTINENT HISTORY: Hypertension, asthma, and osteoarthritis PAIN:  Are you having pain? Yes: NPRS scale: 0/10 Pain location: left knee Pain description: sharp, stabbing, quick, pulling Aggravating factors: sitting still Relieving factors: ice and medication  PRECAUTIONS: Knee  PATIENT GOALS: return to work, play golf, reduced pain, and walk without an assistive device   NEXT MD VISIT: 10/2022  OBJECTIVE: all objective assessments were completed at his initial evaluation on 07/29/22 unless otherwise noted  PATIENT SURVEYS:  FOTO 62.78 on 08/20/22  LOWER EXTREMITY ROM:  Active ROM Right eval Left eval Left 08/06/22 Progress report Left 08/20/22 Left 09/04/22  Hip flexion       Hip extension       Hip abduction       Hip adduction       Hip internal rotation       Hip external rotation       Knee flexion 138 80/87 (PROM)  85/89 (PROM) 90 (AROM) 93  Knee extension 0 '27 15 13 11  '$ Ankle dorsiflexion       Ankle plantarflexion       Ankle inversion  Ankle eversion        (Blank rows = not tested)  LOWER EXTREMITY SPECIAL TESTS:  Not tested due to surgical condition  TODAY'S TREATMENT:                                                                                                                              DATE:                                                      3/8 EXERCISE LOG  Exercise Repetitions and Resistance Comments  Nustep  L4 x 15 minutes; seat 8-5   Leg press  2 pl; seat 5 x 25 reps    Cybex knee flexion  40# x 2 minutes   Rocker board  4 minutes w/ 15 second hold    Backward walking on treadmill  1.0 mph x 4 minutes    Blank cell =  exercise not performed today  Manual Therapy Soft Tissue Mobilization: left hamstring, for reduced pain and tone Passive ROM: flexion and extension, with overpressure to tolerance   Modalities: no redness or adverse reaction to today's modalities  Date:  Unattended Estim: Knee, IFC @ 80-150 Hz w/ 40% scan, 15 mins, Pain and Tone Vaso: Knee, 34 degrees; low pressure, 15 mins, Pain  3/5     EXERCISE LOG  Exercise Repetitions and Resistance Comments  Nustep L3, seat 7-5 x15 min   Slant board stretch 3x30 sec holds   L lunges  14" box x15 reps 5 sec holds   L runner's stretch 8" box x10 reps 5 sec holds   L TKE Green theraband x20 reps 5 sec holds with DF   Leg press 2 pl, seat 6 x20 reps with heel pushoff    Blank cell = exercise not performed today   Manual Therapy Soft Tissue Mobilization: L quad/ ITB, reduce tone to improve ROM    Modalities  Date:  Unattended Estim: Knee, IFC, 10 mins, Pain and Edema Vaso: Knee, Med, 10 mins, Pain and Edema                                   3/1 EXERCISE LOG  Exercise Repetitions and Resistance Comments  Nustep  L4 x 10 minutes; seat 4   Recumbent bike  L1 x 2 minutes; seat 11 Limited by pain  Gastroc stretch  4 x 30 seconds   Standing TKE  Green t-band x 2.5 minutes   Standing HS stretch  4 x 30 seconds    Blank cell = exercise not performed today  Manual Therapy Soft Tissue Mobilization: left hamstrings and quadriceps, for improved soft tissue extensibility Joint Mobilizations: tibiofemoral and patellar, grade I-IV  Passive  ROM: flexion and extension, to tolerance   Modalities: no redness or adverse reaction to today's modalities  Date:  Unattended Estim: Knee, IFC @ 80-150 Hz w/ 40% scan, 15 mins, Pain Vaso: Knee, 34 degrees; low pressure, 15 mins, Pain  PATIENT EDUCATION:  Education details: healing, benefits of walking and exercise Person educated: Patient and Spouse Education method: Explanation Education comprehension:  verbalized understanding  HOME EXERCISE PROGRAM: Reviewed HEP provided by referring physician  ASSESSMENT:  CLINICAL IMPRESSION: Patient was introduced to backwards walking on the treadmill to facilitate improved terminal knee extension.  He required minimal cueing with this intervention to promote increased step length to facilitate increased knee extension. Manual therapy focused on improving knee range of motion to focus on knee extension. He experienced a moderate increase in left knee discomfort with overpressure into flexion and extension, but this did not limit his ability to complete this intervention. He reported that his knee felt good upon the conclusion of today's treatment. Recommend that he continues with skilled physical therapy to address his remaining impairments to return to his prior level of function.  OBJECTIVE IMPAIRMENTS: Abnormal gait, decreased activity tolerance, decreased mobility, difficulty walking, decreased ROM, decreased strength, hypomobility, increased edema, impaired flexibility, impaired tone, and pain.   ACTIVITY LIMITATIONS: carrying, lifting, standing, squatting, stairs, transfers, bed mobility, bathing, dressing, and locomotion level  PARTICIPATION LIMITATIONS: meal prep, cleaning, laundry, driving, shopping, community activity, occupation, and yard work  PERSONAL FACTORS: Profession, Time since onset of injury/illness/exacerbation, and 3+ comorbidities: Hypertension, asthma, and osteoarthritis  are also affecting patient's functional outcome.   REHAB POTENTIAL: Good  CLINICAL DECISION MAKING: Stable/uncomplicated  EVALUATION COMPLEXITY: Low  GOALS: Goals reviewed with patient? Yes  LONG TERM GOALS: Target date: 08/26/22  Patient will be independent with his HEP.  Baseline:  Goal status: MET  2.  Patient will be able to demonstrate at least 120 degrees of active left knee flexion for improved function navigating stairs.  Baseline: 2/23: 91  degrees Goal status: IN PROGRESS  3.  Patient will be able to demonstrate active left knee extension within 5 degrees of neutral for improved gait mechanics.  Baseline: 2/23: -20 degrees Goal status: IN PROGRESS  4.  Patient will be able to safely ambulate at least 80 feet without an assistive device for improved household mobility.  Baseline:  Goal status: MET  5.  Patient will be able to navigate at least 3 steps with a reciprocal pattern for improved household and community mobility.  Baseline:  Goal status: IN PROGRESS  PLAN:  PT FREQUENCY: 2-3x/week  PT DURATION: 4 weeks  PLANNED INTERVENTIONS: Therapeutic exercises, Therapeutic activity, Neuromuscular re-education, Balance training, Gait training, Patient/Family education, Self Care, Joint mobilization, Stair training, Electrical stimulation, Cryotherapy, Moist heat, Vasopneumatic device, Manual therapy, and Re-evaluation  PLAN FOR NEXT SESSION: nustep, focus on knee extension (quad set, LAQ, SLR, and TKE), manual therapy, and modalities as needed  Darlin Coco, PT 09/11/2022, 1:10 PM

## 2022-09-15 ENCOUNTER — Ambulatory Visit: Payer: Commercial Managed Care - PPO | Admitting: *Deleted

## 2022-09-15 ENCOUNTER — Encounter: Payer: Self-pay | Admitting: *Deleted

## 2022-09-15 DIAGNOSIS — R262 Difficulty in walking, not elsewhere classified: Secondary | ICD-10-CM

## 2022-09-15 DIAGNOSIS — M25662 Stiffness of left knee, not elsewhere classified: Secondary | ICD-10-CM

## 2022-09-15 DIAGNOSIS — M25562 Pain in left knee: Secondary | ICD-10-CM | POA: Diagnosis not present

## 2022-09-15 NOTE — Therapy (Signed)
OUTPATIENT PHYSICAL THERAPY LOWER EXTREMITY TREATMENT   Patient Name: Justin Pennington MRN: [id number] DOB:12/04/1960, 62 y.o., male Today's Date: 09/15/2022  END OF SESSION:  PT End of Session - 09/15/22 0813     Visit Number 15    Number of Visits 18    Date for PT Re-Evaluation 10/02/22    PT Start Time 0815    PT Stop Time 0910    PT Time Calculation (min) 55 min            Past Medical History:  Diagnosis Date   Allergic conjunctivitis    Chronic obstructive asthma, unspecified    History of adenomatous polyp of colon 2005   Less than 1 cm   Hyperlipidemia    IBS (irritable bowel syndrome)    Diarrhea predominant   Insomnia, unspecified    Past Surgical History:  Procedure Laterality Date   COLONOSCOPY     GINGIVAL GRAFT     LEFT MENISCUS TEAR -ARTHROSCOPIC REPAIR     Patient Active Problem List   Diagnosis Date Noted   Primary osteoarthritis of left knee 03/20/2022   Elevated coronary artery calcium score 02/10/2022   Essential hypertension 02/10/2022   Nonspecific abnormal electrocardiogram (ECG) (EKG) 01/12/2022   SOB (shortness of breath) 01/12/2022   Polymyalgia rheumatica ([place]) 11/21/2021   Patellofemoral syndrome of left knee 08/12/2021   Impingement syndrome of left shoulder region 08/12/2021   Adhesive capsulitis of left shoulder 08/12/2021   Pain in joint of left shoulder 06/05/2021   Pain in joint of left knee 06/05/2021   Hyperlipemia 06/28/2013   BPH (benign prostatic hyperplasia) 06/28/2013   Seasonal and perennial allergic rhinitis 09/05/2010   Allergic conjunctivitis and rhinitis 06/12/2007   Chronic obstructive airway disease with asthma ([place]) 06/12/2007   INSOMNIA UNSPECIFIED 06/12/2007   IRRITABLE BOWEL SYNDROME, HX OF 06/12/2007   REFERRING PROVIDER: Sydnee Cabal, MD   REFERRING DIAG: Unilateral primary osteoarthritis, left knee   THERAPY DIAG:  Acute pain of left knee  Stiffness of left knee, not elsewhere  classified  Difficulty in walking, not elsewhere classified  Rationale for Evaluation and Treatment: Rehabilitation  ONSET DATE: 07/23/22  SUBJECTIVE:   SUBJECTIVE STATEMENT: Patient feels that his knee is getting better with bending. Need to work on extension   PERTINENT HISTORY: Hypertension, asthma, and osteoarthritis PAIN:  Are you having pain? Yes: NPRS scale: 0/10 Pain location: left knee Pain description: sharp, stabbing, quick, pulling Aggravating factors: sitting still Relieving factors: ice and medication  PRECAUTIONS: Knee  PATIENT GOALS: return to work, play golf, reduced pain, and walk without an assistive device   NEXT MD VISIT: 10/2022  OBJECTIVE: all objective assessments were completed at his initial evaluation on 07/29/22 unless otherwise noted  PATIENT SURVEYS:  FOTO 62.78 on 08/20/22  LOWER EXTREMITY ROM:  Active ROM Right eval Left eval Left 08/06/22 Progress report Left 08/20/22 Left 09/04/22  Hip flexion       Hip extension       Hip abduction       Hip adduction       Hip internal rotation       Hip external rotation       Knee flexion 138 80/87 (PROM)  85/89 (PROM) 90 (AROM) 93  Knee extension 0 '27 15 13 11  '$ Ankle dorsiflexion       Ankle plantarflexion       Ankle inversion       Ankle eversion        (Blank  rows = not tested)  LOWER EXTREMITY SPECIAL TESTS:  Not tested due to surgical condition  TODAY'S TREATMENT:                                                                                                                              DATE:                                                      3/12 EXERCISE LOG  Exercise Repetitions and Resistance Comments  Nustep  L4 x 15 minutes; seat 8-4   Leg press     Cybex knee flexion  40# x 2 minutes   Knee extension 10# x 2 mins   Rocker board  4 minutes w/ 15 second hold    Backward walking on treadmill  1.0 mph x 4 minutes Ramp 5    Blank cell = exercise not performed today  Manual  Therapy Soft Tissue Mobilization:  ,   Passive ROM: extension, with overpressure to tolerance  with QS Modalities: no redness or adverse reaction to today's modalities  Date:  Unattended Estim: Knee, IFC @ 80-150 Hz w/ 40% scan, 15 mins, Pain and Tone Vaso: Knee, 34 degrees; low pressure, 15 mins, Pain  3/5     EXERCISE LOG  Exercise Repetitions and Resistance Comments  Nustep L3, seat 7-5 x15 min   Slant board stretch 3x30 sec holds   L lunges  14" box x15 reps 5 sec holds   L runner's stretch 8" box x10 reps 5 sec holds   L TKE Green theraband x20 reps 5 sec holds with DF   Leg press 2 pl, seat 6 x20 reps with heel pushoff    Blank cell = exercise not performed today   Manual Therapy Soft Tissue Mobilization: L quad/ ITB, reduce tone to improve ROM    Modalities  Date:  Unattended Estim: Knee, IFC, 10 mins, Pain and Edema Vaso: Knee, Med, 10 mins, Pain and Edema                                   3/1 EXERCISE LOG  Exercise Repetitions and Resistance Comments  Nustep  L4 x 10 minutes; seat 4   Recumbent bike  L1 x 2 minutes; seat 11 Limited by pain  Gastroc stretch  4 x 30 seconds   Standing TKE  Green t-band x 2.5 minutes   Standing HS stretch  4 x 30 seconds    Blank cell = exercise not performed today  Manual Therapy Soft Tissue Mobilization: left hamstrings and quadriceps, for improved soft tissue extensibility Joint Mobilizations: tibiofemoral and patellar, grade I-IV  Passive ROM: flexion and extension, to tolerance   Modalities: no redness  or adverse reaction to today's modalities  Date:  Unattended Estim: Knee, IFC @ 80-150 Hz w/ 40% scan, 15 mins, Pain Vaso: Knee, 34 degrees; low pressure, 15 mins, Pain  PATIENT EDUCATION:  Education details: healing, benefits of walking and exercise Person educated: Patient and Spouse Education method: Explanation Education comprehension: verbalized understanding  HOME EXERCISE PROGRAM: Reviewed HEP provided by  referring physician  ASSESSMENT:  CLINICAL IMPRESSION: FOTO performed Pt arrived to clinic today doing fairly well with LT knee progression. Rx focused on flexion and extension ROM as well as quad control and strengthening. Manual PROM focused on extension progression. IFC and Vaso end of session.     OBJECTIVE IMPAIRMENTS: Abnormal gait, decreased activity tolerance, decreased mobility, difficulty walking, decreased ROM, decreased strength, hypomobility, increased edema, impaired flexibility, impaired tone, and pain.   ACTIVITY LIMITATIONS: carrying, lifting, standing, squatting, stairs, transfers, bed mobility, bathing, dressing, and locomotion level  PARTICIPATION LIMITATIONS: meal prep, cleaning, laundry, driving, shopping, community activity, occupation, and yard work  PERSONAL FACTORS: Profession, Time since onset of injury/illness/exacerbation, and 3+ comorbidities: Hypertension, asthma, and osteoarthritis  are also affecting patient's functional outcome.   REHAB POTENTIAL: Good  CLINICAL DECISION MAKING: Stable/uncomplicated  EVALUATION COMPLEXITY: Low  GOALS: Goals reviewed with patient? Yes  LONG TERM GOALS: Target date: 08/26/22  Patient will be independent with his HEP.  Baseline:  Goal status: MET  2.  Patient will be able to demonstrate at least 120 degrees of active left knee flexion for improved function navigating stairs.  Baseline: 2/23: 91 degrees Goal status: IN PROGRESS  3.  Patient will be able to demonstrate active left knee extension within 5 degrees of neutral for improved gait mechanics.  Baseline: 2/23: -20 degrees Goal status: IN PROGRESS  4.  Patient will be able to safely ambulate at least 80 feet without an assistive device for improved household mobility.  Baseline:  Goal status: MET  5.  Patient will be able to navigate at least 3 steps with a reciprocal pattern for improved household and community mobility.  Baseline:  Goal status: IN  PROGRESS  PLAN:  PT FREQUENCY: 2-3x/week  PT DURATION: 4 weeks  PLANNED INTERVENTIONS: Therapeutic exercises, Therapeutic activity, Neuromuscular re-education, Balance training, Gait training, Patient/Family education, Self Care, Joint mobilization, Stair training, Electrical stimulation, Cryotherapy, Moist heat, Vasopneumatic device, Manual therapy, and Re-evaluation  PLAN FOR NEXT SESSION: nustep, focus on knee extension (quad set, LAQ, SLR, and TKE), manual therapy, and modalities as needed  Aaronmichael Brumbaugh,CHRIS, PTA 09/15/2022, 9:27 AM

## 2022-09-17 ENCOUNTER — Encounter: Payer: Self-pay | Admitting: Internal Medicine

## 2022-09-17 ENCOUNTER — Ambulatory Visit (AMBULATORY_SURGERY_CENTER): Payer: Commercial Managed Care - PPO

## 2022-09-17 VITALS — Ht 70.0 in | Wt 177.0 lb

## 2022-09-17 DIAGNOSIS — Z1211 Encounter for screening for malignant neoplasm of colon: Secondary | ICD-10-CM

## 2022-09-17 MED ORDER — NA SULFATE-K SULFATE-MG SULF 17.5-3.13-1.6 GM/177ML PO SOLN: 1.0000 | Freq: Once | ORAL | 0 refills | Status: AC

## 2022-09-17 NOTE — Progress Notes (Signed)

## 2022-09-18 ENCOUNTER — Ambulatory Visit: Payer: Commercial Managed Care - PPO

## 2022-09-18 DIAGNOSIS — R262 Difficulty in walking, not elsewhere classified: Secondary | ICD-10-CM

## 2022-09-18 DIAGNOSIS — M25662 Stiffness of left knee, not elsewhere classified: Secondary | ICD-10-CM

## 2022-09-18 DIAGNOSIS — M25562 Pain in left knee: Secondary | ICD-10-CM

## 2022-09-18 NOTE — Therapy (Signed)
OUTPATIENT PHYSICAL THERAPY LOWER EXTREMITY TREATMENT   Patient Name: Ajeet Avalon MRN: [id number] DOB:05/29/1961, 62 y.o., male Today's Date: 09/18/2022  END OF SESSION:  PT End of Session - 09/18/22 0818     Visit Number 16    Number of Visits 18    Date for PT Re-Evaluation 10/02/22    PT Start Time 0815    PT Stop Time 0915    PT Time Calculation (min) 60 min    Activity Tolerance Patient tolerated treatment well    Behavior During Therapy WFL for tasks assessed/performed            Past Medical History:  Diagnosis Date   Allergic conjunctivitis    Chronic obstructive asthma, unspecified    History of adenomatous polyp of colon 2005   Less than 1 cm   Hyperlipidemia    IBS (irritable bowel syndrome)    Diarrhea predominant   Insomnia, unspecified    Past Surgical History:  Procedure Laterality Date   COLONOSCOPY     GINGIVAL GRAFT     LEFT MENISCUS TEAR -ARTHROSCOPIC REPAIR     Patient Active Problem List   Diagnosis Date Noted   Stiffness of left knee 05/18/2022   Osteoarthritis of left knee 03/24/2022   Primary osteoarthritis of left knee 03/20/2022   Elevated coronary artery calcium score 02/10/2022   Essential hypertension 02/10/2022   Nonspecific abnormal electrocardiogram (ECG) (EKG) 01/12/2022   SOB (shortness of breath) 01/12/2022   Polymyalgia rheumatica ([place]) 11/21/2021   Patellofemoral syndrome of left knee 08/12/2021   Impingement syndrome of left shoulder region 08/12/2021   Adhesive capsulitis of left shoulder 08/12/2021   Pain in joint of left shoulder 06/05/2021   Pain in joint of left knee 06/05/2021   Hyperlipemia 06/28/2013   BPH (benign prostatic hyperplasia) 06/28/2013   Seasonal and perennial allergic rhinitis 09/05/2010   Allergic conjunctivitis and rhinitis 06/12/2007   Chronic obstructive airway disease with asthma ([place]) 06/12/2007   INSOMNIA UNSPECIFIED 06/12/2007   IRRITABLE BOWEL SYNDROME, HX OF 06/12/2007   REFERRING  PROVIDER: Sydnee Cabal, MD   REFERRING DIAG: Unilateral primary osteoarthritis, left knee   THERAPY DIAG:  Acute pain of left knee  Stiffness of left knee, not elsewhere classified  Difficulty in walking, not elsewhere classified  Rationale for Evaluation and Treatment: Rehabilitation  ONSET DATE: 07/23/22  SUBJECTIVE:   SUBJECTIVE STATEMENT: Patient reports that his knee is not hurting today. He has some rare sharp pain in his knee, but this does last longer than a few seconds.   PERTINENT HISTORY: Hypertension, asthma, and osteoarthritis PAIN:  Are you having pain? Yes: NPRS scale: 0/10 Pain location: left knee Pain description: sharp, stabbing, quick, pulling Aggravating factors: sitting still Relieving factors: ice and medication  PRECAUTIONS: Knee  PATIENT GOALS: return to work, play golf, reduced pain, and walk without an assistive device   NEXT MD VISIT: 10/2022  OBJECTIVE: all objective assessments were completed at his initial evaluation on 07/29/22 unless otherwise noted  PATIENT SURVEYS:  FOTO 62.78 on 08/20/22  LOWER EXTREMITY ROM:  Active ROM Right eval Left eval Left 08/06/22 Progress report Left 08/20/22 Left 09/04/22  Hip flexion       Hip extension       Hip abduction       Hip adduction       Hip internal rotation       Hip external rotation       Knee flexion 138 80/87 (PROM)  85/89 (PROM) 90 (  AROM) 93  Knee extension 0 27 15 13 11   Ankle dorsiflexion       Ankle plantarflexion       Ankle inversion       Ankle eversion        (Blank rows = not tested)  LOWER EXTREMITY SPECIAL TESTS:  Not tested due to surgical condition  TODAY'S TREATMENT:                                                                                                                              DATE:                                                      3/15 EXERCISE LOG  Exercise Repetitions and Resistance Comments  Nustep  L4 x 15 minutes; seat 7-4   Rocker board 2  minutes   SL leg press  1 plate; seat 7 x 20 reps each    Seated HS stretch  4 x 30 seconds  With self overpressure  Cybex knee flexion  40# x 30 reps    Cybex knee extension 10# x 30 reps 9-103 degrees of active knee extension and flexion respectively   Prone hang 3# x 2.5 minutes With moist heat to hamstring  Thomas stretch  4 x 30 seconds     Blank cell = exercise not performed today  Modalities  Date:  Unattended Estim: Knee, IFC @ 80-150 Hz w/ 40% scan, 15 mins, Pain and Tone Vaso: Knee, 34 degrees; low pressure, 15 mins, Pain and Tone Hot Pack: Knee, 3 mins, Tone                                   3/12 EXERCISE LOG  Exercise Repetitions and Resistance Comments  Nustep  L4 x 15 minutes; seat 8-4   Leg press     Cybex knee flexion  40# x 2 minutes   Knee extension 10# x 2 mins   Rocker board  4 minutes w/ 15 second hold    Backward walking on treadmill  1.0 mph x 4 minutes Ramp 5    Blank cell = exercise not performed today  Manual Therapy Soft Tissue Mobilization:  ,   Passive ROM: extension, with overpressure to tolerance  with QS Modalities: no redness or adverse reaction to today's modalities  Date:  Unattended Estim: Knee, IFC @ 80-150 Hz w/ 40% scan, 15 mins, Pain and Tone Vaso: Knee, 34 degrees; low pressure, 15 mins, Pain  3/5     EXERCISE LOG  Exercise Repetitions and Resistance Comments  Nustep L3, seat 7-5 x15 min   Slant board stretch 3x30 sec holds   L lunges  14" box x15 reps  5 sec holds   L runner's stretch 8" box x10 reps 5 sec holds   L TKE Green theraband x20 reps 5 sec holds with DF   Leg press 2 pl, seat 6 x20 reps with heel pushoff    Blank cell = exercise not performed today   Manual Therapy Soft Tissue Mobilization: L quad/ ITB, reduce tone to improve ROM    Modalities  Date:  Unattended Estim: Knee, IFC, 10 mins, Pain and Edema Vaso: Knee, Med, 10 mins, Pain and Edema  PATIENT EDUCATION:  Education details: healing, benefits of walking  and exercise Person educated: Patient and Spouse Education method: Explanation Education comprehension: verbalized understanding  HOME EXERCISE PROGRAM: Reviewed HEP provided by referring physician  ASSESSMENT:  CLINICAL IMPRESSION:  Today's treatment focused primarily on interventions for improved knee extension. However, he was introduced to a Thomas stretch for improved knee flexion and this was added to his HEP. He required minimal cueing with today's interventions for proper exercise performance. He experienced a moderate increase in discomfort with the prone hang. However, this discomfort reduced after this activity was completed. He reported that his knee felt good upon conclusion of treatment. He continues to require skilled physical therapy to address his remaining impairments to return to his prior level of function.  OBJECTIVE IMPAIRMENTS: Abnormal gait, decreased activity tolerance, decreased mobility, difficulty walking, decreased ROM, decreased strength, hypomobility, increased edema, impaired flexibility, impaired tone, and pain.   ACTIVITY LIMITATIONS: carrying, lifting, standing, squatting, stairs, transfers, bed mobility, bathing, dressing, and locomotion level  PARTICIPATION LIMITATIONS: meal prep, cleaning, laundry, driving, shopping, community activity, occupation, and yard work  PERSONAL FACTORS: Profession, Time since onset of injury/illness/exacerbation, and 3+ comorbidities: Hypertension, asthma, and osteoarthritis  are also affecting patient's functional outcome.   REHAB POTENTIAL: Good  CLINICAL DECISION MAKING: Stable/uncomplicated  EVALUATION COMPLEXITY: Low  GOALS: Goals reviewed with patient? Yes  LONG TERM GOALS: Target date: 08/26/22  Patient will be independent with his HEP.  Baseline:  Goal status: MET  2.  Patient will be able to demonstrate at least 120 degrees of active left knee flexion for improved function navigating stairs.  Baseline:  2/23: 91 degrees Goal status: IN PROGRESS  3.  Patient will be able to demonstrate active left knee extension within 5 degrees of neutral for improved gait mechanics.  Baseline: 2/23: -20 degrees Goal status: IN PROGRESS  4.  Patient will be able to safely ambulate at least 80 feet without an assistive device for improved household mobility.  Baseline:  Goal status: MET  5.  Patient will be able to navigate at least 3 steps with a reciprocal pattern for improved household and community mobility.  Baseline:  Goal status: IN PROGRESS  PLAN:  PT FREQUENCY: 2-3x/week  PT DURATION: 4 weeks  PLANNED INTERVENTIONS: Therapeutic exercises, Therapeutic activity, Neuromuscular re-education, Balance training, Gait training, Patient/Family education, Self Care, Joint mobilization, Stair training, Electrical stimulation, Cryotherapy, Moist heat, Vasopneumatic device, Manual therapy, and Re-evaluation  PLAN FOR NEXT SESSION: nustep, focus on knee extension (quad set, LAQ, SLR, and TKE), manual therapy, and modalities as needed  Darlin Coco, PT 09/18/2022, 1:37 PM

## 2022-09-23 ENCOUNTER — Ambulatory Visit: Payer: Commercial Managed Care - PPO

## 2022-09-23 DIAGNOSIS — M25662 Stiffness of left knee, not elsewhere classified: Secondary | ICD-10-CM

## 2022-09-23 DIAGNOSIS — M25562 Pain in left knee: Secondary | ICD-10-CM

## 2022-09-23 DIAGNOSIS — R262 Difficulty in walking, not elsewhere classified: Secondary | ICD-10-CM

## 2022-09-23 NOTE — Therapy (Signed)
OUTPATIENT PHYSICAL THERAPY LOWER EXTREMITY TREATMENT   Patient Name: Geordon Solin MRN: [id number] DOB:07/22/1960, 62 y.o., male Today's Date: 09/23/2022  END OF SESSION:  PT End of Session - 09/23/22 0823     Visit Number 17    Number of Visits 20    Date for PT Re-Evaluation 10/30/22    PT Start Time 0815    PT Stop Time 0915    PT Time Calculation (min) 60 min    Activity Tolerance Patient tolerated treatment well    Behavior During Therapy WFL for tasks assessed/performed            Past Medical History:  Diagnosis Date   Allergic conjunctivitis    Chronic obstructive asthma, unspecified    History of adenomatous polyp of colon 2005   Less than 1 cm   Hyperlipidemia    IBS (irritable bowel syndrome)    Diarrhea predominant   Insomnia, unspecified    Past Surgical History:  Procedure Laterality Date   COLONOSCOPY     GINGIVAL GRAFT     LEFT MENISCUS TEAR -ARTHROSCOPIC REPAIR     Patient Active Problem List   Diagnosis Date Noted   Stiffness of left knee 05/18/2022   Osteoarthritis of left knee 03/24/2022   Primary osteoarthritis of left knee 03/20/2022   Elevated coronary artery calcium score 02/10/2022   Essential hypertension 02/10/2022   Nonspecific abnormal electrocardiogram (ECG) (EKG) 01/12/2022   SOB (shortness of breath) 01/12/2022   Polymyalgia rheumatica ([place]) 11/21/2021   Patellofemoral syndrome of left knee 08/12/2021   Impingement syndrome of left shoulder region 08/12/2021   Adhesive capsulitis of left shoulder 08/12/2021   Pain in joint of left shoulder 06/05/2021   Pain in joint of left knee 06/05/2021   Hyperlipemia 06/28/2013   BPH (benign prostatic hyperplasia) 06/28/2013   Seasonal and perennial allergic rhinitis 09/05/2010   Allergic conjunctivitis and rhinitis 06/12/2007   Chronic obstructive airway disease with asthma ([place]) 06/12/2007   INSOMNIA UNSPECIFIED 06/12/2007   IRRITABLE BOWEL SYNDROME, HX OF 06/12/2007   REFERRING  PROVIDER: Sydnee Cabal, MD   REFERRING DIAG: Unilateral primary osteoarthritis, left knee   THERAPY DIAG:  Acute pain of left knee  Stiffness of left knee, not elsewhere classified  Difficulty in walking, not elsewhere classified  Rationale for Evaluation and Treatment: Rehabilitation  ONSET DATE: 07/23/22  SUBJECTIVE:   SUBJECTIVE STATEMENT: Patient reports that his knee is stiff this morning. His knee only hurts when he bends his knee.   PERTINENT HISTORY: Hypertension, asthma, and osteoarthritis PAIN:  Are you having pain? Yes: NPRS scale: 0/10 Pain location: left knee Pain description: sharp, stabbing, quick, pulling Aggravating factors: sitting still Relieving factors: ice and medication  PRECAUTIONS: Knee  PATIENT GOALS: return to work, play golf, reduced pain, and walk without an assistive device   NEXT MD VISIT: week of 10/12/22   OBJECTIVE: all objective assessments were completed at his initial evaluation on 07/29/22 unless otherwise noted  PATIENT SURVEYS:  FOTO 62.78 on 08/20/22  LOWER EXTREMITY ROM:  Active ROM Right eval Left eval Left 08/06/22 Progress report Left 08/20/22 Left 09/04/22  Hip flexion       Hip extension       Hip abduction       Hip adduction       Hip internal rotation       Hip external rotation       Knee flexion 138 80/87 (PROM)  85/89 (PROM) 90 (AROM) 93  Knee extension 0  27 15 13 11   Ankle dorsiflexion       Ankle plantarflexion       Ankle inversion       Ankle eversion        (Blank rows = not tested)  LOWER EXTREMITY SPECIAL TESTS:  Not tested due to surgical condition  TODAY'S TREATMENT:                                                                                                                              DATE:                                                      3/20 EXERCISE LOG  Exercise Repetitions and Resistance Comments  Nustep  L4 x 14 minutes; seat 6-3   Recumbent bike  L3 x 5 minutes; seat 9   Cybex  knee flexion  40# x 3 minutes   Cybex knee extension  10# x 3 minutes    Tandem balance on foam 3 x 30 seconds each   Step down  8" step x 25 reps    Standing HS stretch 4 x 30 seconds    Standing gastroc stretch 4 x 30 seconds    Blank cell = exercise not performed today  Modalities  Date:  Vaso: Knee, 34 degrees; low pressure, 15 mins, Pain                                   3/15 EXERCISE LOG  Exercise Repetitions and Resistance Comments  Nustep  L4 x 15 minutes; seat 7-4   Rocker board 2 minutes   SL leg press  1 plate; seat 7 x 20 reps each    Seated HS stretch  4 x 30 seconds  With self overpressure  Cybex knee flexion  40# x 30 reps    Cybex knee extension 10# x 30 reps 9-103 degrees of active knee extension and flexion respectively   Prone hang 3# x 2.5 minutes With moist heat to hamstring  Thomas stretch  4 x 30 seconds     Blank cell = exercise not performed today  Modalities  Date:  Unattended Estim: Knee, IFC @ 80-150 Hz w/ 40% scan, 15 mins, Pain and Tone Vaso: Knee, 34 degrees; low pressure, 15 mins, Pain and Tone Hot Pack: Knee, 3 mins, Tone                                   3/12 EXERCISE LOG  Exercise Repetitions and Resistance Comments  Nustep  L4 x 15 minutes; seat 8-4   Leg press     Cybex knee flexion  40# x 2 minutes   Knee extension 10# x 2 mins   Rocker board  4 minutes w/ 15 second hold    Backward walking on treadmill  1.0 mph x 4 minutes Ramp 5    Blank cell = exercise not performed today  Manual Therapy Soft Tissue Mobilization:  ,   Passive ROM: extension, with overpressure to tolerance  with QS Modalities: no redness or adverse reaction to today's modalities  Date:  Unattended Estim: Knee, IFC @ 80-150 Hz w/ 40% scan, 15 mins, Pain and Tone Vaso: Knee, 34 degrees; low pressure, 15 mins, Pain  PATIENT EDUCATION:  Education details: healing, benefits of walking and exercise Person educated: Patient and Spouse Education method:  Explanation Education comprehension: verbalized understanding  HOME EXERCISE PROGRAM: Reviewed HEP provided by referring physician  ASSESSMENT:  CLINICAL IMPRESSION:  Treatment focused on familiar interventions for improved knee mobility and strength. He required minimal cueing with step downs for improved eccentric quadriceps control. He experienced no significant pain or discomfort with any of today's interventions. He reported that his knee felt tight, but good upon the conclusion of treatment. He continues to require skilled physical therapy to address his remaining impairments to maximize his functional mobility.   OBJECTIVE IMPAIRMENTS: Abnormal gait, decreased activity tolerance, decreased mobility, difficulty walking, decreased ROM, decreased strength, hypomobility, increased edema, impaired flexibility, impaired tone, and pain.   ACTIVITY LIMITATIONS: carrying, lifting, standing, squatting, stairs, transfers, bed mobility, bathing, dressing, and locomotion level  PARTICIPATION LIMITATIONS: meal prep, cleaning, laundry, driving, shopping, community activity, occupation, and yard work  PERSONAL FACTORS: Profession, Time since onset of injury/illness/exacerbation, and 3+ comorbidities: Hypertension, asthma, and osteoarthritis  are also affecting patient's functional outcome.   REHAB POTENTIAL: Good  CLINICAL DECISION MAKING: Stable/uncomplicated  EVALUATION COMPLEXITY: Low  GOALS: Goals reviewed with patient? Yes  LONG TERM GOALS: Target date: 08/26/22  Patient will be independent with his HEP.  Baseline:  Goal status: MET  2.  Patient will be able to demonstrate at least 120 degrees of active left knee flexion for improved function navigating stairs.  Baseline: 2/23: 91 degrees Goal status: IN PROGRESS  3.  Patient will be able to demonstrate active left knee extension within 5 degrees of neutral for improved gait mechanics.  Baseline: 2/23: -20 degrees Goal status: IN  PROGRESS  4.  Patient will be able to safely ambulate at least 80 feet without an assistive device for improved household mobility.  Baseline:  Goal status: MET  5.  Patient will be able to navigate at least 3 steps with a reciprocal pattern for improved household and community mobility.  Baseline:  Goal status: IN PROGRESS  PLAN:  PT FREQUENCY: 2-3x/week  PT DURATION: 4 weeks  PLANNED INTERVENTIONS: Therapeutic exercises, Therapeutic activity, Neuromuscular re-education, Balance training, Gait training, Patient/Family education, Self Care, Joint mobilization, Stair training, Electrical stimulation, Cryotherapy, Moist heat, Vasopneumatic device, Manual therapy, and Re-evaluation  PLAN FOR NEXT SESSION: nustep, focus on knee extension (quad set, LAQ, SLR, and TKE), manual therapy, and modalities as needed  Darlin Coco, PT 09/23/2022, 2:54 PM

## 2022-09-25 ENCOUNTER — Ambulatory Visit: Payer: Commercial Managed Care - PPO | Admitting: Physical Therapy

## 2022-09-25 ENCOUNTER — Encounter: Payer: Commercial Managed Care - PPO | Admitting: Internal Medicine

## 2022-09-25 ENCOUNTER — Encounter: Payer: Self-pay | Admitting: Physical Therapy

## 2022-09-25 DIAGNOSIS — M25562 Pain in left knee: Secondary | ICD-10-CM

## 2022-09-25 DIAGNOSIS — R262 Difficulty in walking, not elsewhere classified: Secondary | ICD-10-CM

## 2022-09-25 DIAGNOSIS — M25662 Stiffness of left knee, not elsewhere classified: Secondary | ICD-10-CM

## 2022-09-25 NOTE — Therapy (Signed)
OUTPATIENT PHYSICAL THERAPY LOWER EXTREMITY TREATMENT   Patient Name: Justin Pennington MRN: [id number] DOB:Sep 17, 1960, 62 y.o., male Today's Date: 09/25/2022  END OF SESSION:  PT End of Session - 09/25/22 1028     Visit Number 19    Number of Visits 20    Date for PT Re-Evaluation 10/30/22    PT Start Time 0810    Activity Tolerance Patient tolerated treatment well    Behavior During Therapy [facility] for tasks assessed/performed            Past Medical History:  Diagnosis Date   Allergic conjunctivitis    Chronic obstructive asthma, unspecified    History of adenomatous polyp of colon 2005   Less than 1 cm   Hyperlipidemia    IBS (irritable bowel syndrome)    Diarrhea predominant   Insomnia, unspecified    Past Surgical History:  Procedure Laterality Date   COLONOSCOPY     GINGIVAL GRAFT     LEFT MENISCUS TEAR -ARTHROSCOPIC REPAIR     Patient Active Problem List   Diagnosis Date Noted   Stiffness of left knee 05/18/2022   Osteoarthritis of left knee 03/24/2022   Primary osteoarthritis of left knee 03/20/2022   Elevated coronary artery calcium score 02/10/2022   Essential hypertension 02/10/2022   Nonspecific abnormal electrocardiogram (ECG) (EKG) 01/12/2022   SOB (shortness of breath) 01/12/2022   Polymyalgia rheumatica ([place]) 11/21/2021   Patellofemoral syndrome of left knee 08/12/2021   Impingement syndrome of left shoulder region 08/12/2021   Adhesive capsulitis of left shoulder 08/12/2021   Pain in joint of left shoulder 06/05/2021   Pain in joint of left knee 06/05/2021   Hyperlipemia 06/28/2013   BPH (benign prostatic hyperplasia) 06/28/2013   Seasonal and perennial allergic rhinitis 09/05/2010   Allergic conjunctivitis and rhinitis 06/12/2007   Chronic obstructive airway disease with asthma ([place]) 06/12/2007   INSOMNIA UNSPECIFIED 06/12/2007   IRRITABLE BOWEL SYNDROME, HX OF 06/12/2007   REFERRING PROVIDER: Sydnee Cabal, MD   REFERRING DIAG: Unilateral  primary osteoarthritis, left knee   THERAPY DIAG:  Acute pain of left knee  Stiffness of left knee, not elsewhere classified  Difficulty in walking, not elsewhere classified  Rationale for Evaluation and Treatment: Rehabilitation  ONSET DATE: 07/23/22  SUBJECTIVE:   SUBJECTIVE STATEMENT: Knee feels stiff.  PERTINENT HISTORY: Hypertension, asthma, and osteoarthritis PAIN:  Are you having pain? Yes: NPRS scale: 0/10 Pain location: left knee Pain description: sharp, stabbing, quick, pulling Aggravating factors: sitting still Relieving factors: ice and medication  PRECAUTIONS: Knee  PATIENT GOALS: return to work, play golf, reduced pain, and walk without an assistive device   NEXT MD VISIT: week of 10/12/22   OBJECTIVE: all objective assessments were completed at his initial evaluation on 07/29/22 unless otherwise noted  PATIENT SURVEYS:  FOTO 62.78 on 08/20/22    DATE:    09/25/22:  Recumbent bike at seat 9 and progressing to seat 7 over 15 minutes f/b knee ext at 10# x 3 minutes f/b a 10 minute prone hand while receiving STW/M (over the 10 minutes) to his left hamstrings.                  PATIENT EDUCATION:  Education details: healing, benefits of walking and exercise Person educated: Patient and Spouse Education method: Explanation Education comprehension: verbalized understanding  HOME EXERCISE PROGRAM: Reviewed HEP provided by referring physician  ASSESSMENT:  CLINICAL IMPRESSION:  Very good job with progression to seat 7 on recumbent bike.  He tolerated  prone hang well while receiving soft tissue work to his left hamstring muscle group during the prone hang exercise.  OBJECTIVE IMPAIRMENTS: Abnormal gait, decreased activity tolerance, decreased mobility, difficulty walking, decreased ROM, decreased strength, hypomobility, increased edema, impaired flexibility, impaired tone, and pain.   ACTIVITY LIMITATIONS: carrying, lifting, standing, squatting, stairs,  transfers, bed mobility, bathing, dressing, and locomotion level  PARTICIPATION LIMITATIONS: meal prep, cleaning, laundry, driving, shopping, community activity, occupation, and yard work  PERSONAL FACTORS: Profession, Time since onset of injury/illness/exacerbation, and 3+ comorbidities: Hypertension, asthma, and osteoarthritis  are also affecting patient's functional outcome.   REHAB POTENTIAL: Good  CLINICAL DECISION MAKING: Stable/uncomplicated  EVALUATION COMPLEXITY: Low  GOALS: Goals reviewed with patient? Yes  LONG TERM GOALS: Target date: 08/26/22  Patient will be independent with his HEP.  Baseline:  Goal status: MET  2.  Patient will be able to demonstrate at least 120 degrees of active left knee flexion for improved function navigating stairs.  Baseline: 2/23: 91 degrees Goal status: IN PROGRESS  3.  Patient will be able to demonstrate active left knee extension within 5 degrees of neutral for improved gait mechanics.  Baseline: 2/23: -20 degrees Goal status: IN PROGRESS  4.  Patient will be able to safely ambulate at least 80 feet without an assistive device for improved household mobility.  Baseline:  Goal status: MET  5.  Patient will be able to navigate at least 3 steps with a reciprocal pattern for improved household and community mobility.  Baseline:  Goal status: IN PROGRESS  PLAN:  PT FREQUENCY: 2-3x/week  PT DURATION: 4 weeks  PLANNED INTERVENTIONS: Therapeutic exercises, Therapeutic activity, Neuromuscular re-education, Balance training, Gait training, Patient/Family education, Self Care, Joint mobilization, Stair training, Electrical stimulation, Cryotherapy, Moist heat, Vasopneumatic device, Manual therapy, and Re-evaluation  PLAN FOR NEXT SESSION: nustep, focus on knee extension (quad set, LAQ, SLR, and TKE), manual therapy, and modalities as needed  Safiya Girdler, [place], PT 09/25/2022, 10:47 AM

## 2022-09-30 ENCOUNTER — Ambulatory Visit: Payer: Commercial Managed Care - PPO

## 2022-09-30 DIAGNOSIS — M25662 Stiffness of left knee, not elsewhere classified: Secondary | ICD-10-CM

## 2022-09-30 DIAGNOSIS — M25562 Pain in left knee: Secondary | ICD-10-CM | POA: Diagnosis not present

## 2022-09-30 DIAGNOSIS — R262 Difficulty in walking, not elsewhere classified: Secondary | ICD-10-CM

## 2022-09-30 NOTE — Therapy (Signed)
OUTPATIENT PHYSICAL THERAPY LOWER EXTREMITY TREATMENT   Patient Name: Justin Pennington MRN: [id number] DOB:Sep 10, 1960, 62 y.o., male Today's Date: 09/30/2022  END OF SESSION:  PT End of Session - 09/30/22 0819     Visit Number 20    Number of Visits 22    Date for PT Re-Evaluation 10/30/22    PT Start Time 0815    PT Stop Time 0908    PT Time Calculation (min) 53 min    Activity Tolerance Patient tolerated treatment well    Behavior During Therapy WFL for tasks assessed/performed            Past Medical History:  Diagnosis Date   Allergic conjunctivitis    Chronic obstructive asthma, unspecified    History of adenomatous polyp of colon 2005   Less than 1 cm   Hyperlipidemia    IBS (irritable bowel syndrome)    Diarrhea predominant   Insomnia, unspecified    Past Surgical History:  Procedure Laterality Date   COLONOSCOPY     GINGIVAL GRAFT     LEFT MENISCUS TEAR -ARTHROSCOPIC REPAIR     Patient Active Problem List   Diagnosis Date Noted   Stiffness of left knee 05/18/2022   Osteoarthritis of left knee 03/24/2022   Primary osteoarthritis of left knee 03/20/2022   Elevated coronary artery calcium score 02/10/2022   Essential hypertension 02/10/2022   Nonspecific abnormal electrocardiogram (ECG) (EKG) 01/12/2022   SOB (shortness of breath) 01/12/2022   Polymyalgia rheumatica ([place]) 11/21/2021   Patellofemoral syndrome of left knee 08/12/2021   Impingement syndrome of left shoulder region 08/12/2021   Adhesive capsulitis of left shoulder 08/12/2021   Pain in joint of left shoulder 06/05/2021   Pain in joint of left knee 06/05/2021   Hyperlipemia 06/28/2013   BPH (benign prostatic hyperplasia) 06/28/2013   Seasonal and perennial allergic rhinitis 09/05/2010   Allergic conjunctivitis and rhinitis 06/12/2007   Chronic obstructive airway disease with asthma ([place]) 06/12/2007   INSOMNIA UNSPECIFIED 06/12/2007   IRRITABLE BOWEL SYNDROME, HX OF 06/12/2007   REFERRING  PROVIDER: Sydnee Cabal, MD   REFERRING DIAG: Unilateral primary osteoarthritis, left knee   THERAPY DIAG:  Acute pain of left knee  Stiffness of left knee, not elsewhere classified  Difficulty in walking, not elsewhere classified  Rationale for Evaluation and Treatment: Rehabilitation  ONSET DATE: 07/23/22  SUBJECTIVE:   SUBJECTIVE STATEMENT: Patient reports that his knee is really stiff this morning. He was a little sore after his last appointment.   PERTINENT HISTORY: Hypertension, asthma, and osteoarthritis PAIN:  Are you having pain? Yes: NPRS scale: 0/10 Pain location: left knee Pain description: sharp, stabbing, quick, pulling Aggravating factors: sitting still Relieving factors: ice and medication  PRECAUTIONS: Knee  PATIENT GOALS: return to work, play golf, reduced pain, and walk without an assistive device   NEXT MD VISIT: week of 10/12/22   OBJECTIVE: all objective assessments were completed at his initial evaluation on 07/29/22 unless otherwise noted  PATIENT SURVEYS:  FOTO 62.78 on 08/20/22    TREATMENT LOG:                                                 3/27 EXERCISE LOG  Exercise Repetitions and Resistance Comments  Recumbent bike  L2-4 x 17 minutes; seat 8-5   Cybex knee flexion  40# x 2.5 minutes  Cybex knee extension  20# x 2.5 minutes   Gastroc stretch  4 x 30 seconds   Prone hang  6# x 4 minutes   Standing HS stretch  4 x 30 seconds   Standing TKE  Green t-band x 2.5 minutes    Blank cell = exercise not performed today  Modalities  Date:  Vaso: Knee, 34 degrees; low pressure, 10 mins, Pain  PATIENT EDUCATION:  Education details: healing, benefits of walking and exercise Person educated: Patient and Spouse Education method: Explanation Education comprehension: verbalized understanding  HOME EXERCISE PROGRAM: Reviewed HEP provided by referring physician  ASSESSMENT:  CLINICAL IMPRESSION:  Treatment focused on familiar  interventions for improved knee mobility needed for improved function with his daily activities. He required minimal cueing with standing TKE's to facilitate quadriceps engagement. He experienced a moderate increase in soreness with prone hangs, but this did not limit his ability to complete any of today's interventions. He reported that his knee felt good upon the conclusion of treatment. He continues to require skilled physical therapy to address his remaining impairments to return to his prior level of function.   OBJECTIVE IMPAIRMENTS: Abnormal gait, decreased activity tolerance, decreased mobility, difficulty walking, decreased ROM, decreased strength, hypomobility, increased edema, impaired flexibility, impaired tone, and pain.   ACTIVITY LIMITATIONS: carrying, lifting, standing, squatting, stairs, transfers, bed mobility, bathing, dressing, and locomotion level  PARTICIPATION LIMITATIONS: meal prep, cleaning, laundry, driving, shopping, community activity, occupation, and yard work  PERSONAL FACTORS: Profession, Time since onset of injury/illness/exacerbation, and 3+ comorbidities: Hypertension, asthma, and osteoarthritis  are also affecting patient's functional outcome.   REHAB POTENTIAL: Good  CLINICAL DECISION MAKING: Stable/uncomplicated  EVALUATION COMPLEXITY: Low  GOALS: Goals reviewed with patient? Yes  LONG TERM GOALS: Target date: 08/26/22  Patient will be independent with his HEP.  Baseline:  Goal status: MET  2.  Patient will be able to demonstrate at least 120 degrees of active left knee flexion for improved function navigating stairs.  Baseline: 2/23: 91 degrees Goal status: IN PROGRESS  3.  Patient will be able to demonstrate active left knee extension within 5 degrees of neutral for improved gait mechanics.  Baseline: 2/23: -20 degrees Goal status: IN PROGRESS  4.  Patient will be able to safely ambulate at least 80 feet without an assistive device for improved  household mobility.  Baseline:  Goal status: MET  5.  Patient will be able to navigate at least 3 steps with a reciprocal pattern for improved household and community mobility.  Baseline:  Goal status: IN PROGRESS  PLAN:  PT FREQUENCY: 2-3x/week  PT DURATION: 4 weeks  PLANNED INTERVENTIONS: Therapeutic exercises, Therapeutic activity, Neuromuscular re-education, Balance training, Gait training, Patient/Family education, Self Care, Joint mobilization, Stair training, Electrical stimulation, Cryotherapy, Moist heat, Vasopneumatic device, Manual therapy, and Re-evaluation  PLAN FOR NEXT SESSION: nustep, focus on knee extension (quad set, LAQ, SLR, and TKE), manual therapy, and modalities as needed  Darlin Coco, PT 09/30/2022, 10:08 AM

## 2022-10-09 ENCOUNTER — Ambulatory Visit: Payer: Commercial Managed Care - PPO | Attending: Specialist

## 2022-10-09 DIAGNOSIS — M25662 Stiffness of left knee, not elsewhere classified: Secondary | ICD-10-CM | POA: Diagnosis present

## 2022-10-09 DIAGNOSIS — M25562 Pain in left knee: Secondary | ICD-10-CM | POA: Diagnosis not present

## 2022-10-09 DIAGNOSIS — R262 Difficulty in walking, not elsewhere classified: Secondary | ICD-10-CM | POA: Insufficient documentation

## 2022-10-09 NOTE — Therapy (Signed)
OUTPATIENT PHYSICAL THERAPY LOWER EXTREMITY TREATMENT   Patient Name: Johnella MoloneyDavid Lenhard MRN: [id number] DOB:06/03/1961, 62 y.o., male Today's Date: 10/09/2022  END OF SESSION:  PT End of Session - 10/09/22 0819     Visit Number 21    Number of Visits 22    Date for PT Re-Evaluation 10/30/22    PT Start Time 0815    PT Stop Time 0909    PT Time Calculation (min) 54 min    Activity Tolerance Patient tolerated treatment well    [facility] assessed/performed            Past Medical History:  Diagnosis Date   Allergic conjunctivitis    Chronic obstructive asthma, unspecified    History of adenomatous polyp of colon 2005   Less than 1 cm   Hyperlipidemia    IBS (irritable bowel syndrome)    Diarrhea predominant   Insomnia, unspecified    Past Surgical History:  Procedure Laterality Date   COLONOSCOPY     GINGIVAL GRAFT     LEFT MENISCUS TEAR -ARTHROSCOPIC REPAIR     Patient Active Problem List   Diagnosis Date Noted   Stiffness of left knee 05/18/2022   Osteoarthritis of left knee 03/24/2022   Primary osteoarthritis of left knee 03/20/2022   Elevated coronary artery calcium score 02/10/2022   Essential hypertension 02/10/2022   Nonspecific abnormal electrocardiogram (ECG) (EKG) 01/12/2022   SOB (shortness of breath) 01/12/2022   Polymyalgia rheumatica 11/21/2021   Patellofemoral syndrome of left knee 08/12/2021   Impingement syndrome of left shoulder region 08/12/2021   Adhesive capsulitis of left shoulder 08/12/2021   Pain in joint of left shoulder 06/05/2021   Pain in joint of left knee 06/05/2021   Hyperlipemia 06/28/2013   BPH (benign prostatic hyperplasia) 06/28/2013   Seasonal and perennial allergic rhinitis 09/05/2010   Allergic conjunctivitis and rhinitis 06/12/2007   Chronic obstructive airway disease with asthma ([facility]) 06/12/2007   INSOMNIA UNSPECIFIED 06/12/2007   IRRITABLE BOWEL SYNDROME, HX OF 06/12/2007   REFERRING PROVIDER:  Eugenia Mcalpineollins, Robert, MD   REFERRING DIAG: Unilateral primary osteoarthritis, left knee   THERAPY DIAG:  Acute pain of left knee  Stiffness of left knee, not elsewhere classified  Difficulty in walking, not elsewhere classified  Rationale for Evaluation and Treatment: Rehabilitation  ONSET DATE: 07/23/22  SUBJECTIVE:   SUBJECTIVE STATEMENT: Pt denies any pain today.    PERTINENT HISTORY: Hypertension, asthma, and osteoarthritis PAIN:  Are you having pain? No  PRECAUTIONS: Knee  PATIENT GOALS: return to work, play golf, reduced pain, and walk without an assistive device   NEXT MD VISIT: week of 10/12/22   OBJECTIVE: all objective assessments were completed at his initial evaluation on 07/29/22 unless otherwise noted  PATIENT SURVEYS:  FOTO 62.78 on 08/20/22    TREATMENT LOG:                                                 4/5 EXERCISE LOG  Exercise Repetitions and Resistance Comments  Recumbent bike  L2-4 x 17 minutes; seat 8-5   Cybex knee flexion  40# x 3 minutes   Cybex knee extension  20# x 2.5 minutes   Leg Press 2 plate; seat 6 x 3 mins   Gastroc stretch  5 x 30 seconds   Prone hang  7.5# x 4  minutes   Standing HS stretch  4 x 30 seconds   Standing TKE      Blank cell = exercise not performed today  Modalities  Date:  Vaso: Knee, 34 degrees; low pressure, 15 mins, Pain  PATIENT EDUCATION:  Education details: healing, benefits of walking and exercise Person educated: Patient and Spouse Education method: Explanation Education comprehension: verbalized understanding  HOME EXERCISE PROGRAM: Reviewed HEP provided by referring physician  ASSESSMENT:  CLINICAL IMPRESSION:  Pt arrives for today's treatment session denying any pain.  Pt able to increase FOTO score to 81 today.  Pt able to tolerate increased time with cybex exercises today without issue.  Pt able to tolerate leg press today as well with min cues for eccentric control.  Pt also able to tolerate  increased weight with prone hang today.  Pt encouraged to continue prone hang at home.  Normal responses to vaso noted upon removal.  Pt denied any pain at completion of today's treatment session.  OBJECTIVE IMPAIRMENTS: Abnormal gait, decreased activity tolerance, decreased mobility, difficulty walking, decreased ROM, decreased strength, hypomobility, increased edema, impaired flexibility, impaired tone, and pain.   ACTIVITY LIMITATIONS: carrying, lifting, standing, squatting, stairs, transfers, bed mobility, bathing, dressing, and locomotion level  PARTICIPATION LIMITATIONS: meal prep, cleaning, laundry, driving, shopping, community activity, occupation, and yard work  PERSONAL FACTORS: Profession, Time since onset of injury/illness/exacerbation, and 3+ comorbidities: Hypertension, asthma, and osteoarthritis  are also affecting patient's functional outcome.   REHAB POTENTIAL: Good  CLINICAL DECISION MAKING: Stable/uncomplicated  EVALUATION COMPLEXITY: Low  GOALS: Goals reviewed with patient? Yes  LONG TERM GOALS: Target date: 08/26/22  Patient will be independent with his HEP.  Baseline:  Goal status: MET  2.  Patient will be able to demonstrate at least 120 degrees of active left knee flexion for improved function navigating stairs.  Baseline: 2/23: 91 degrees Goal status: IN PROGRESS  3.  Patient will be able to demonstrate active left knee extension within 5 degrees of neutral for improved gait mechanics.  Baseline: 2/23: -20 degrees Goal status: IN PROGRESS  4.  Patient will be able to safely ambulate at least 80 feet without an assistive device for improved household mobility.  Baseline:  Goal status: MET  5.  Patient will be able to navigate at least 3 steps with a reciprocal pattern for improved household and community mobility.  Baseline:  Goal status: MET  PLAN:  PT FREQUENCY: 2-3x/week  PT DURATION: 4 weeks  PLANNED INTERVENTIONS: Therapeutic exercises,  Therapeutic activity, Neuromuscular re-education, Balance training, Gait training, Patient/Family education, Self Care, Joint mobilization, Stair training, Electrical stimulation, Cryotherapy, Moist heat, Vasopneumatic device, Manual therapy, and Re-evaluation  PLAN FOR NEXT SESSION: nustep, focus on knee extension (quad set, LAQ, SLR, and TKE), manual therapy, and modalities as needed  Newman Pies, PTA 10/09/2022, 9:17 AM

## 2022-10-12 ENCOUNTER — Encounter: Payer: Commercial Managed Care - PPO | Admitting: Internal Medicine

## 2022-10-16 ENCOUNTER — Ambulatory Visit: Payer: Commercial Managed Care - PPO

## 2022-10-16 DIAGNOSIS — M25562 Pain in left knee: Secondary | ICD-10-CM

## 2022-10-16 DIAGNOSIS — M25662 Stiffness of left knee, not elsewhere classified: Secondary | ICD-10-CM

## 2022-10-16 DIAGNOSIS — R262 Difficulty in walking, not elsewhere classified: Secondary | ICD-10-CM

## 2022-10-16 NOTE — Therapy (Addendum)
OUTPATIENT PHYSICAL THERAPY LOWER EXTREMITY TREATMENT   Patient Name: Justin Pennington MRN: [id number] DOB:Jan 29, 1961, 62 y.o., male Today's Date: 10/16/2022  END OF SESSION:  PT End of Session - 10/16/22 0818     Visit Number 22    Number of Visits 22    Date for PT Re-Evaluation 10/30/22    PT Start Time 0815    PT Stop Time 0904    PT Time Calculation (min) 49 min    Activity Tolerance Patient tolerated treatment well    Behavior During Therapy WFL for tasks assessed/performed            Past Medical History:  Diagnosis Date   Allergic conjunctivitis    Chronic obstructive asthma, unspecified    History of adenomatous polyp of colon 2005   Less than 1 cm   Hyperlipidemia    IBS (irritable bowel syndrome)    Diarrhea predominant   Insomnia, unspecified    Past Surgical History:  Procedure Laterality Date   COLONOSCOPY     GINGIVAL GRAFT     LEFT MENISCUS TEAR -ARTHROSCOPIC REPAIR     Patient Active Problem List   Diagnosis Date Noted   Stiffness of left knee 05/18/2022   Osteoarthritis of left knee 03/24/2022   Primary osteoarthritis of left knee 03/20/2022   Elevated coronary artery calcium score 02/10/2022   Essential hypertension 02/10/2022   Nonspecific abnormal electrocardiogram (ECG) (EKG) 01/12/2022   SOB (shortness of breath) 01/12/2022   Polymyalgia rheumatica 11/21/2021   Patellofemoral syndrome of left knee 08/12/2021   Impingement syndrome of left shoulder region 08/12/2021   Adhesive capsulitis of left shoulder 08/12/2021   Pain in joint of left shoulder 06/05/2021   Pain in joint of left knee 06/05/2021   Hyperlipemia 06/28/2013   BPH (benign prostatic hyperplasia) 06/28/2013   Seasonal and perennial allergic rhinitis 09/05/2010   Allergic conjunctivitis and rhinitis 06/12/2007   Chronic obstructive airway disease with asthma ([facility]) 06/12/2007   INSOMNIA UNSPECIFIED 06/12/2007   IRRITABLE BOWEL SYNDROME, HX OF 06/12/2007   REFERRING PROVIDER:  Eugenia Mcalpine, MD   REFERRING DIAG: Unilateral primary osteoarthritis, left knee   THERAPY DIAG:  Acute pain of left knee  Stiffness of left knee, not elsewhere classified  Difficulty in walking, not elsewhere classified  Rationale for Evaluation and Treatment: Rehabilitation  ONSET DATE: 07/23/22  SUBJECTIVE:   SUBJECTIVE STATEMENT: Pt denies any pain today.  Ready for discharge.  PERTINENT HISTORY: Hypertension, asthma, and osteoarthritis PAIN:  Are you having pain? No  PRECAUTIONS: Knee  PATIENT GOALS: return to work, play golf, reduced pain, and walk without an assistive device   NEXT MD VISIT: week of 10/12/22   OBJECTIVE: all objective assessments were completed at his initial evaluation on 07/29/22 unless otherwise noted  PATIENT SURVEYS:  FOTO 62.78 on 08/20/22    TREATMENT LOG:                                                 4/12 EXERCISE LOG  Exercise Repetitions and Resistance Comments  Recumbent bike  L3 x 17 minutes; seat 8-5   Cybex knee flexion  50# x 2.5 minutes   Cybex knee extension  20# x 3 minutes   Leg Press 2 plate; seat 6 x 3 mins   Gastroc stretch  5 x 30 seconds   Prone hang  7.5#  x 4 minutes   Standing HS stretch  4 x 30 seconds   Standing TKE      Blank cell = exercise not performed today  Modalities  Date:  Vaso: Knee, 34 degrees; low pressure, 10 mins, Pain  PATIENT EDUCATION:  Education details: healing, benefits of walking and exercise Person educated: Patient and Spouse Education method: Explanation Education comprehension: verbalized understanding  HOME EXERCISE PROGRAM: Reviewed HEP provided by referring physician  ASSESSMENT:  CLINICAL IMPRESSION:  Pt arrives for today's treatment session denying any pain.  Pt able to demonstrate 108 degrees of left knee flexion and -7 degrees of left knee extension.  Pt has met all of his goals except his ROM goals at this time to which he has made good progress.  Pt has MD  appointment next Friday.  Normal responses to vaso noted upon removal.  Pt ready for discharge at this time.  PHYSICAL THERAPY DISCHARGE SUMMARY  Visits from Start of Care: 22  Current functional level related to goals / functional outcomes: Patient was able to meet his goals for improved function.  However, he was unable to meet his active range of motion goals.   Remaining deficits: Left knee active range of motion   Education / Equipment: HEP  Patient agrees to discharge. Patient goals were partially met. Patient is being discharged due to being pleased with the current functional level.  Candi Leash, PT, DPT    OBJECTIVE IMPAIRMENTS: Abnormal gait, decreased activity tolerance, decreased mobility, difficulty walking, decreased ROM, decreased strength, hypomobility, increased edema, impaired flexibility, impaired tone, and pain.   ACTIVITY LIMITATIONS: carrying, lifting, standing, squatting, stairs, transfers, bed mobility, bathing, dressing, and locomotion level  PARTICIPATION LIMITATIONS: meal prep, cleaning, laundry, driving, shopping, community activity, occupation, and yard work  PERSONAL FACTORS: Profession, Time since onset of injury/illness/exacerbation, and 3+ comorbidities: Hypertension, asthma, and osteoarthritis  are also affecting patient's functional outcome.   REHAB POTENTIAL: Good  CLINICAL DECISION MAKING: Stable/uncomplicated  EVALUATION COMPLEXITY: Low  GOALS: Goals reviewed with patient? Yes  LONG TERM GOALS: Target date: 08/26/22  Patient will be independent with his HEP.  Baseline:  Goal status: MET  2.  Patient will be able to demonstrate at least 120 degrees of active left knee flexion for improved function navigating stairs.  Baseline: 2/23: 91 degrees; 4/12: 108 degrees Goal status: IN PROGRESS  3.  Patient will be able to demonstrate active left knee extension within 5 degrees of neutral for improved gait mechanics.  Baseline: 2/23: -20  degrees; 4/12: -7 degrees Goal status: IN PROGRESS  4.  Patient will be able to safely ambulate at least 80 feet without an assistive device for improved household mobility.  Baseline:  Goal status: MET  5.  Patient will be able to navigate at least 3 steps with a reciprocal pattern for improved household and community mobility.  Baseline:  Goal status: MET  PLAN:  PT FREQUENCY: 2-3x/week  PT DURATION: 4 weeks  PLANNED INTERVENTIONS: Therapeutic exercises, Therapeutic activity, Neuromuscular re-education, Balance training, Gait training, Patient/Family education, Self Care, Joint mobilization, Stair training, Electrical stimulation, Cryotherapy, Moist heat, Vasopneumatic device, Manual therapy, and Re-evaluation  PLAN FOR NEXT SESSION: nustep, focus on knee extension (quad set, LAQ, SLR, and TKE), manual therapy, and modalities as needed  Newman Pies, PTA 10/16/2022, 9:06 AM

## 2022-11-10 ENCOUNTER — Ambulatory Visit: Payer: Commercial Managed Care - PPO | Admitting: Internal Medicine

## 2022-12-23 NOTE — Progress Notes (Signed)
HPI male never smoker followed for chronic obstructive asthma ( a1AT MM, history of pneumonia at age 62 or 86 months) , allergic conjunctivitis, allergic rhinitis a1AT 09/05/10- MM wnl Office spirometry 08/24/2013-moderately severe obstruction. FVC 3.64/77%, FEV1 2.13/57%, FEV1/FVC 0.59, FEF 25-75% 1.23/33% FENO 10/27/16-  34 H PFT 04/11/18- Moderately severe obstruction, no resp to BD, no restriction, NL DLCO   FEV1/FVC 0.60  ----------------------------------------------------------------------------------------------------    05/12/22- 62 year old male never smoker followed for Chronic Obstructive Asthma ( a1AT MM, history of pneumonia at age 35 or 24 months ), allergic conjunctivitis, allergic rhinitis, CAD, complicated by IBS, BPH, Hyperlipidemia, PMR, -Advair 100, pro-air HFA, Flonase, zyrtec Covid vax- 2 Moderna  Flu vax- plans after surgery Preop clearance for L TKR/ Dr Thomasena Edis PFT ordered 11/21/21- scheduled for Nov 24 Cardiac Scoring CT 92%- Dr Antoine Poche Continues low-dose prednisone for polymyalgia rheumatica. He has been limited by left knee pain and looking forward to surgery.  Breathing has been no problem at all and he does not remember when he last used his rescue inhaler.  Denies cough, wheeze or phlegm.  No concerns identified.       He is clear from pulmonary standpoint to proceed with knee replacement surgery as planned CXR 11/22/21- IMPRESSION: Stable chronic COPD/emphysema pattern. No interval change or superimposed acute process by plain radiography  12/25/22- 62 year old male never smoker followed for Chronic Obstructive Asthma ( a1AT MM, history of pneumonia at age 27 or 69 months ), allergic conjunctivitis, allergic rhinitis, CAD, complicated by IBS, BPH, Hyperlipidemia, PMR, -Trelegy 100, pro-air HFA, Flonase, zyrtec,  ACT 25 -----Breathing has been good  He reports doing very well.  Breathing has been "great "with no acute problems. He is off of prednisone which was  taken for about 14 months under rheumatology direction for PMR.  He is now on observation status. He complains about restless sleep estimating only averages 3 to 4 hours a night.  Difficulty initiating and maintaining sleep.  Told by his wife that he snores at least at times.  Tired during the day.  He does not feel he has time to nap.  Melatonin helps some.  Also tried Tylenol PM.  Given history of snoring with no ENT surgery, we discussed doing a home sleep test to exclude OSA and he agreed.   C XR 05/24/22-  IMPRESSION: No active cardiopulmonary disease.   ROS-see HPI    + = positive Constitutional:   No-   weight loss, night sweats, fevers, chills, fatigue, lassitude. HEENT:   No-  headaches, difficulty swallowing, tooth/dental problems, sore throat,       No-  sneezing, itching, ear ache, nasal congestion, post nasal drip,  CV:  No-   chest pain, orthopnea, PND, swelling in lower extremities, anasarca,  dizziness, palpitations Resp: +shortness of breath with exertion or at rest.              No-   productive cough,  No non-productive cough,  No- coughing up of blood.              No-   change in color of mucus.  + wheezing.   Skin: No-   rash or lesions. GI:  No-   heartburn, indigestion, abdominal pain, nausea, GU: . MS:  No-   joint pain or swelling.  Neuro-     nothing unusual Psych:  No- change in mood or affect. No depression or anxiety.  No memory loss.  OBJ- Physical Exam General- Alert, Oriented, Affect-appropriate, Distress- none  acute,  Skin- rash-none, lesions- none, excoriation- none Lymphadenopathy- none Head- atraumatic            Eyes- Gross vision intact, PERRLA, conjunctivae and secretions clear            Ears- Hearing, canals-normal            Nose- Clear, no-Septal dev, mucus, polyps, erosion, perforation.             Throat- Mallampati II , mucosa clear , drainage- none, tonsils- atrophic,  Neck- flexible , trachea midline, no stridor , thyroid nl, carotid  no bruit Chest - symmetrical excursion , unlabored           Heart/CV- RRR , no murmur , no gallop  , no rub, nl s1 s2                           - JVD- none , edema- none, stasis changes- none, varices- none           Lung- clear to P&A/+ diminished, wheeze- none, cough- none , dullness-none, rub- none           Chest wall-  Abd-  Br/ Gen/ Rectal- Not done, not indicated Extrem- cyanosis- none, clubbing, none, atrophy- none, strength- nl Neuro- grossly intact to observation

## 2022-12-25 ENCOUNTER — Encounter: Payer: Self-pay | Admitting: Internal Medicine

## 2022-12-25 ENCOUNTER — Ambulatory Visit: Payer: Commercial Managed Care - PPO | Admitting: Internal Medicine

## 2022-12-25 VITALS — BP 130/72 | HR 62 | Ht 70.0 in | Wt 183.8 lb

## 2022-12-25 DIAGNOSIS — R0683 Snoring: Secondary | ICD-10-CM

## 2022-12-25 DIAGNOSIS — G4733 Obstructive sleep apnea (adult) (pediatric): Secondary | ICD-10-CM | POA: Insufficient documentation

## 2022-12-25 DIAGNOSIS — G479 Sleep disorder, unspecified: Secondary | ICD-10-CM | POA: Diagnosis not present

## 2022-12-25 DIAGNOSIS — J4489 Other specified chronic obstructive pulmonary disease: Secondary | ICD-10-CM

## 2022-12-25 NOTE — Assessment & Plan Note (Signed)
Moderate persistent uncomplicated. Plan-continue current meds.

## 2022-12-25 NOTE — Assessment & Plan Note (Signed)
Not clear if there are disturbances to sleep such as sleep apnea or if this will be managed as insomnia.  Discussed sleep habits. Plan-schedule home sleep test.  For now we plan return in a year but that can be changed depending on results of the sleep study.

## 2022-12-25 NOTE — Patient Instructions (Signed)
Glad your breathing feels good- ok to continue current meds  Order- schedule home sleep test    dx snoring    Please call us for results and recommendations about 2 weeks after your sleep test.

## 2023-01-19 ENCOUNTER — Ambulatory Visit (INDEPENDENT_AMBULATORY_CARE_PROVIDER_SITE_OTHER): Payer: Commercial Managed Care - PPO

## 2023-01-19 DIAGNOSIS — G4733 Obstructive sleep apnea (adult) (pediatric): Secondary | ICD-10-CM | POA: Diagnosis not present

## 2023-01-19 DIAGNOSIS — R0683 Snoring: Secondary | ICD-10-CM

## 2023-01-22 ENCOUNTER — Telehealth: Payer: Self-pay | Admitting: Internal Medicine

## 2023-01-22 ENCOUNTER — Other Ambulatory Visit: Payer: Self-pay

## 2023-01-22 MED ORDER — ALBUTEROL SULFATE HFA 108 (90 BASE) MCG/ACT IN AERS: INHALATION_SPRAY | RESPIRATORY_TRACT | 6 refills | Status: DC

## 2023-01-22 NOTE — Telephone Encounter (Signed)
Dr. Annamaria Boots please advise on sleep study results.

## 2023-01-22 NOTE — Telephone Encounter (Signed)
Pt. Calling wanting hST result and need a script sent to pharmacy for refills albuterol (VENTOLIN HFA) 108 (90 Base) MCG/ACT inhaler   [facility]

## 2023-01-24 MED ORDER — LEVALBUTEROL TARTRATE 45 MCG/ACT IN AERO: INHALATION_SPRAY | RESPIRATORY_TRACT | 12 refills | Status: AC

## 2023-01-24 NOTE — Telephone Encounter (Signed)
Home sleep test showed obstructive sleep apnea, averaging 13 apneas/ hour.  I recommend we order new DME, new CPAP auto 5-20, mask of choice, humidifier, supplies, AirView/ card. We will need to see him again in 31-90 days.

## 2023-01-24 NOTE — Telephone Encounter (Signed)
Albuterol inhaler no longer covered by insurance, so script sent for levalbuterol, to [facility] [place].

## 2023-01-25 ENCOUNTER — Other Ambulatory Visit: Payer: Self-pay

## 2023-01-25 DIAGNOSIS — G4733 Obstructive sleep apnea (adult) (pediatric): Secondary | ICD-10-CM

## 2023-01-25 DIAGNOSIS — R0683 Snoring: Secondary | ICD-10-CM

## 2023-01-25 NOTE — Telephone Encounter (Signed)
Went over sleep study results with patient. Order has been placed for patient to get a new cpap machine. Patient has also been scheduled for 90 day f/u with Dr. Maple Hudson. Closing encounter NFN

## 2023-02-15 ENCOUNTER — Encounter: Payer: Self-pay | Admitting: Internal Medicine

## 2023-02-19 ENCOUNTER — Ambulatory Visit: Payer: Commercial Managed Care - PPO

## 2023-02-23 ENCOUNTER — Ambulatory Visit: Payer: Commercial Managed Care - PPO | Admitting: Internal Medicine

## 2023-03-09 ENCOUNTER — Ambulatory Visit: Payer: Commercial Managed Care - PPO | Admitting: Internal Medicine

## 2023-04-29 NOTE — Progress Notes (Signed)
HPI male never smoker followed for chronic obstructive asthma ( a1AT MM, history of pneumonia at age 62 or 37 months) , allergic conjunctivitis, allergic rhinitis a1AT 09/05/10- MM wnl Office spirometry 08/24/2013-moderately severe obstruction. FVC 3.64/77%, FEV1 2.13/57%, FEV1/FVC 0.59, FEF 25-75% 1.23/33% FENO 10/27/16-  34 H PFT 04/11/18- Moderately severe obstruction, no resp to BD, no restriction, NL DLCO   FEV1/FVC 0.60  ----------------------------------------------------------------------------------------------------    05/12/22- 62 year old male never smoker followed for Chronic Obstructive Asthma ( a1AT MM, history of pneumonia at age 88 or 41 months ), allergic conjunctivitis, allergic rhinitis, CAD, complicated by IBS, BPH, Hyperlipidemia, PMR, -Advair 100, pro-air HFA, Flonase, zyrtec Covid vax- 2 Moderna  Flu vax- plans after surgery Preop clearance for L TKR/ Dr Thomasena Edis PFT ordered 11/21/21- scheduled for Nov 24 Cardiac Scoring CT 92%- Dr Antoine Poche Continues low-dose prednisone for polymyalgia rheumatica. He has been limited by left knee pain and looking forward to surgery.  Breathing has been no problem at all and he does not remember when he last used his rescue inhaler.  Denies cough, wheeze or phlegm.  No concerns identified.       He is clear from pulmonary standpoint to proceed with knee replacement surgery as planned CXR 11/22/21- IMPRESSION: Stable chronic COPD/emphysema pattern. No interval change or superimposed acute process by plain radiography  12/25/22- 62 year old male never smoker followed for Chronic Obstructive Asthma ( a1AT MM, history of pneumonia at age 57 or 34 months ), allergic conjunctivitis, allergic rhinitis, CAD, complicated by IBS, BPH, Hyperlipidemia, PMR, -Trelegy 100, pro-air HFA, Flonase, zyrtec,  ACT 25 -----Breathing has been good  He reports doing very well.  Breathing has been "great "with no acute problems. He is off of prednisone which was  taken for about 14 months under rheumatology direction for PMR.  He is now on observation status. He complains about restless sleep estimating only averages 3 to 4 hours a night.  Difficulty initiating and maintaining sleep.  Told by his wife that he snores at least at times.  Tired during the day.  He does not feel he has time to nap.  Melatonin helps some.  Also tried Tylenol PM.  Given history of snoring with no ENT surgery, we discussed doing a home sleep test to exclude OSA and he agreed. C XR 05/24/22-  IMPRESSION: No active cardiopulmonary disease.  04/30/23- 62 year old male never smoker followed for Chronic Obstructive Asthma ( a1AT MM, history of pneumonia at age 23 or 50 months ), allergic conjunctivitis, allergic rhinitis, CAD, complicated by IBS, BPH, Hyperlipidemia, PMR, OSA/failed CPAP. -Trelegy 100, xopenexHFA, Flonase, zyrtec, HST 01/02/23- AHI 13.3/hr, desaturation to 89%, body weight 185 lbs CPAP auto 5-20/ [employer]  new 01/25/23- turned back in. Download compliance-not using Body weight today- 187 lbs He says he tried CPAP for a month and couldn't tell that it made any difference, so he turned it back in. We discussed alternatives and encouraged weight management and sleep off back. Breathing has been very good. Uses Trelegy, not needing rescue inhaler. Eyes water a lot- dry eye vs allergy. He will discuss with Dr Dione Booze at next appt Flu vax standard.  ROS-see HPI    + = positive Constitutional:   No-   weight loss, night sweats, fevers, chills, fatigue, lassitude. HEENT:   No-  headaches, difficulty swallowing, tooth/dental problems, sore throat,       No-  sneezing, itching, ear ache, nasal congestion, post nasal drip,  CV:  No-   chest pain, orthopnea, PND, swelling  in lower extremities, anasarca,  dizziness, palpitations Resp: +shortness of breath with exertion or at rest.              No-   productive cough,  No non-productive cough,  No- coughing up of blood.               No-   change in color of mucus.  + wheezing.   Skin: No-   rash or lesions. GI:  No-   heartburn, indigestion, abdominal pain, nausea, GU: . MS:  No-   joint pain or swelling.  Neuro-     nothing unusual Psych:  No- change in mood or affect. No depression or anxiety.  No memory loss.  OBJ- Physical Exam General- Alert, Oriented, Affect-appropriate, Distress- none acute,  Skin- rash-none, lesions- none, excoriation- none Lymphadenopathy- none Head- atraumatic            Eyes- Gross vision intact, PERRLA, conjunctivae and secretions clear            Ears- Hearing, canals-normal            Nose- Clear, no-Septal dev, mucus, polyps, erosion, perforation.             Throat- Mallampati II , mucosa clear , drainage- none, tonsils- atrophic,  Neck- flexible , trachea midline, no stridor , thyroid nl, carotid no bruit Chest - symmetrical excursion , unlabored           Heart/CV- RRR , no murmur , no gallop  , no rub, nl s1 s2                           - JVD- none , edema- none, stasis changes- none, varices- none           Lung- clear to P&A/+ diminished, wheeze- none, cough- none , dullness-none, rub- none           Chest wall-  Abd-  Br/ Gen/ Rectal- Not done, not indicated Extrem- cyanosis- none, clubbing, none, atrophy- none, strength- nl Neuro- grossly intact to observation h

## 2023-04-30 ENCOUNTER — Encounter: Payer: Self-pay | Admitting: Internal Medicine

## 2023-04-30 ENCOUNTER — Ambulatory Visit: Payer: Commercial Managed Care - PPO | Admitting: Internal Medicine

## 2023-04-30 VITALS — BP 134/70 | HR 71 | Ht 70.0 in | Wt 187.0 lb

## 2023-04-30 DIAGNOSIS — Z23 Encounter for immunization: Secondary | ICD-10-CM

## 2023-04-30 DIAGNOSIS — G4733 Obstructive sleep apnea (adult) (pediatric): Secondary | ICD-10-CM

## 2023-04-30 DIAGNOSIS — J4489 Other specified chronic obstructive pulmonary disease: Secondary | ICD-10-CM | POA: Diagnosis not present

## 2023-04-30 NOTE — Patient Instructions (Signed)
Remember to try to keep weight down and sleep pf the flat of your back. If sleep issues continue we  can talk about alternatives to CPAP again.  Order - flu vax standard

## 2023-04-30 NOTE — Assessment & Plan Note (Signed)
He felt it didn't help and turned CPAP back in. Discussed management options for mild OSA Plan- keep weight down, sleep of back. Reconsider over time.

## 2023-04-30 NOTE — Assessment & Plan Note (Signed)
Feels well controlled using just Trelegy- moderate persistent uncomplicated Plan- refill inhalers when needed. Flu vax today.

## 2023-06-10 ENCOUNTER — Other Ambulatory Visit: Payer: Self-pay | Admitting: Internal Medicine

## 2023-07-08 ENCOUNTER — Ambulatory Visit (INDEPENDENT_AMBULATORY_CARE_PROVIDER_SITE_OTHER): Payer: Commercial Managed Care - PPO | Admitting: Ophthalmology

## 2023-07-08 ENCOUNTER — Encounter (INDEPENDENT_AMBULATORY_CARE_PROVIDER_SITE_OTHER): Payer: Self-pay | Admitting: Ophthalmology

## 2023-07-08 DIAGNOSIS — H25013 Cortical age-related cataract, bilateral: Secondary | ICD-10-CM | POA: Diagnosis not present

## 2023-07-08 DIAGNOSIS — H353111 Nonexudative age-related macular degeneration, right eye, early dry stage: Secondary | ICD-10-CM

## 2023-07-08 DIAGNOSIS — H353221 Exudative age-related macular degeneration, left eye, with active choroidal neovascularization: Secondary | ICD-10-CM | POA: Diagnosis not present

## 2023-07-08 MED ORDER — BEVACIZUMAB CHEMO INJECTION 1.25MG/0.05ML SYRINGE FOR KALEIDOSCOPE
1.2500 mg | INTRAVITREAL | Status: AC | PRN
  Administered 2023-07-08: 1.25 mg via INTRAVITREAL

## 2023-07-08 NOTE — Progress Notes (Signed)
 [facility] - Clinic Note  07/08/2023   CHIEF COMPLAINT Patient presents for Retina Evaluation  HISTORY OF PRESENT ILLNESS: Justin Pennington is a 63 y.o. male who presents to the clinic today for:  HPI     Retina Evaluation   In right eye.  This started 1 month ago.  Duration of 1 month.  I, the attending physician,  performed the HPI with the patient and updated documentation appropriately.        Comments   Patient here for Retina Evaluation. Referred by Dr CANDIE Gaudy. Patient states vision OD fine. OS blurry and cloudy. Started about a month ago. No eye pain.      Last edited by [name], MD on 07/08/2023  6:44 PM.    Patient states that about a month ago he started noticing a visual distortion and blurriness.   Referring physician: Gaudy Charlie Hamilton, MD [address] [place],  [place] [address]  HISTORICAL INFORMATION:  Selected notes from the MEDICAL RECORD NUMBER Referred by Dr. GORMAN Gaudy for ex ARMD OS LEE:  Ocular Hx- PMH-   CURRENT MEDICATIONS: No current outpatient medications on file. (Ophthalmic Drugs)   No current facility-administered medications for this visit. (Ophthalmic Drugs)   Current Outpatient Medications (Other)  Medication Sig   cetirizine  (ZYRTEC ) 10 MG tablet Take 1 tablet (10 mg total) by mouth daily.   Evolocumab  (REPATHA  SURECLICK) 140 MG/ML SOAJ Inject 1 mL into the skin every 14 (fourteen) days.   finasteride (PROPECIA) 1 MG tablet Take 1 mg by mouth daily.   levalbuterol  (XOPENEX  HFA) 45 MCG/ACT inhaler Inhale 2 puffs every 6 hours as needed   TRELEGY ELLIPTA  100-62.5-25 MCG/ACT AEPB INHALE 1 PUFF BY MOUTH EVERY DAY   No current facility-administered medications for this visit. (Other)   REVIEW OF SYSTEMS: ROS   Positive for: Eyes Last edited by Orval Asberry GORMAN, COA on 07/08/2023 10:25 AM.     ALLERGIES Allergies  Allergen Reactions   Crestor [Rosuvastatin]     Joint stiffness   Lipitor  [Atorvastatin  Calcium ] Other (See Comments)    Joint stiffness   Zetia [Ezetimibe] Other (See Comments)    Muscle aches   PAST MEDICAL HISTORY Past Medical History:  Diagnosis Date   Allergic conjunctivitis    Chronic obstructive asthma, unspecified    History of adenomatous polyp of colon 2005   Less than 1 cm   Hyperlipidemia    IBS (irritable bowel syndrome)    Diarrhea predominant   Insomnia, unspecified    Past Surgical History:  Procedure Laterality Date   COLONOSCOPY     GINGIVAL GRAFT     LEFT MENISCUS TEAR -ARTHROSCOPIC REPAIR     FAMILY HISTORY Family History  Problem Relation Age of Onset   Diabetes Mother        borderline diabetes   Cancer Father        BLADDER   Asthma Brother    Colon cancer Maternal Aunt 60   Stomach cancer Neg Hx    Colon polyps Neg Hx    Esophageal cancer Neg Hx    Rectal cancer Neg Hx    SOCIAL HISTORY Social History   Tobacco Use   Smoking status: Never   Smokeless tobacco: Never  Vaping Use   Vaping status: Never Used  Substance Use Topics   Alcohol use: Yes    Comment: occasional   Drug use: No       OPHTHALMIC EXAM:  Base Eye Exam     Visual Acuity (Snellen - Linear)       Right Left   Dist [place] 20/20 -1 20/150   Dist ph [place]  20/50 -1         Tonometry (Tonopen, 10:22 AM)       Right Left   Pressure 20 21         Pupils       Dark Light Shape React APD   Right 3 2 Round Brisk None   Left 3 2 Round Brisk None         Visual Fields (Counting fingers)       Left Right    Full Full         Extraocular Movement       Right Left    Full, Ortho Full, Ortho         Neuro/Psych     Oriented x3: Yes   Mood/Affect: Normal         Dilation     Both eyes: 1.0% Mydriacyl, 2.5% Phenylephrine @ 10:22 AM           Slit Lamp and Fundus Exam     External Exam       Right Left   External Normal Normal         Slit Lamp Exam       Right Left   Lids/Lashes Dermatochalasis -  upper lid Dermatochalasis - upper lid   Conjunctiva/Sclera White and quiet White and quiet   Cornea Arcus, focal corneal haze 0400 Arcus   Anterior Chamber Deep and clear Deep and clear   Iris Round and dilated Round and dilated   Lens 2-3+ Nuclear sclerosis, 2+ Cortical cataract 2-3+ Nuclear sclerosis, 2+ Cortical cataract   Anterior Vitreous Vitreous syneresis Normal         Fundus Exam       Right Left   Disc Pink and sharp Pink and sharp   C/D Ratio 0.2 0.2   Macula Flat, Blunted foveal reflex, Drusen, RPE mottling and clumping, No heme or edema Blunted foveal reflex, Central CNV with edema, punctate heme, fine drusen   Vessels Vascular attenuation, Tortuous Vascular attenuation, Tortuous   Periphery Attached, No heme Attached, No heme           Refraction     Manifest Refraction       Sphere Cylinder Axis Dist VA   Right +0.75 +0.75 061 20/30   Left +1.00 +0.50 090 20/40           IMAGING AND PROCEDURES  Imaging and Procedures for 07/08/2023  OCT, Retina - OU - Both Eyes       Right Eye Quality was good. Central Foveal Thickness: 314. Progression has no prior data. Findings include normal foveal contour, no IRF, no SRF, retinal drusen , pigment epithelial detachment, vitreomacular adhesion .   Left Eye Quality was good. Central Foveal Thickness: 555. Progression has no prior data. Findings include abnormal foveal contour, subretinal hyper-reflective material, choroidal neovascular membrane, pigment epithelial detachment, vitreomacular adhesion (large central CNV with surrounding IRF/SRF).   Notes *Images captured and stored on drive  Diagnosis / Impression:  OD: Non Ex. AMD - retinal drusen OS: exudative ARMD - large central CNV with surrounding IRF/SRF  Clinical management:  See below  Abbreviations: NFP - Normal foveal profile. CME - cystoid macular edema. PED - pigment epithelial detachment. IRF - intraretinal fluid. SRF - subretinal fluid. EZ -  ellipsoid zone. ERM - epiretinal membrane. ORA - outer retinal atrophy. ORT - outer retinal tubulation. SRHM - subretinal hyper-reflective material. IRHM - intraretinal hyper-reflective material      Intravitreal Injection, Pharmacologic Agent - OS - Left Eye       Time Out 07/08/2023. 12:40 PM. Confirmed correct patient, procedure, site, and patient consented.   Anesthesia Topical anesthesia was used. Anesthetic medications included Lidocaine 2%, Proparacaine 0.5%.   Procedure Preparation included 5% betadine to ocular surface, eyelid speculum. A (32g) needle was used.   Injection: 1.25 mg Bevacizumab  1.25mg /0.6ml   Route: Intravitreal, Site: Left Eye   NDC: [id number], Lot: [id number], Expiration date: 09/20/2023   Post-op Post injection exam found visual acuity of at least counting fingers. The patient tolerated the procedure well. There were no complications. The patient received written and verbal post procedure care education.           ASSESSMENT/PLAN:   ICD-10-CM   1. 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 Exudative age-related macular degeneration of left eye with active choroidal neovascularization (HCC)  H35.3221 OCT, Retina - OU - Both Eyes    Intravitreal Injection, Pharmacologic Agent - OS - Left Eye    Bevacizumab  (AVASTIN ) SOLN 1.25 mg    CANCELED: Intravitreal Injection, Pharmacologic Agent - OD - Right Eye    3. 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 Age related macular degeneration, non-exudative, OD  - The incidence, anatomy, and pathology of dry AMD, risk of progression, and the AREDS and AREDS 2 studies including smoking risks discussed with patient.  - Recommend amsler grid monitoring  2. 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, OS  - pt reports 1 mo history of blurred vision and distortion, affecting his golfing  - The incidence pathology and anatomy of wet AMD discussed   - discussed treatment options including observation vs intravitreal anti-VEGF agents such as Avastin , Lucentis, Eylea .   - Risks of endophthalmitis and vascular occlusive events and atrophic changes discussed with patient - BCVA OS 20/50  - OCT OS shows: large central CNV with surrounding IRF/SRF  - recommend IVA OS  #1 today 1.2.25 w/ f/u in 4 wks - pt wishes to be treated with IVA - RBA of procedure discussed, questions answered - informed consent obtained and signed 01.02.25 - see procedure note  - f/u in 4 wks -- DFE/OCT, possible injection   3.  Mixed Cataract OU - The symptoms of cataract, surgical options, and treatments and risks were discussed with patient. - discussed diagnosis and progression - under the expert care of [facility] - monitor   Ophthalmic Meds Ordered this visit:  Meds ordered this encounter  Medications   Bevacizumab  (AVASTIN ) SOLN 1.25 mg     Return in about 4 weeks (around 08/05/2023) for f/u Ex. AMD OS , DFE, OCT, Possible, IVA, OS.  There are no Patient Instructions on file for this visit.  Explained the diagnoses, plan, and follow up with the patient and they expressed understanding.  Patient expressed understanding of the importance of proper follow up care.   This document serves as a record of services personally performed by Redell JUDITHANN Hans, MD, PhD. It was created on their behalf by Wanda GEANNIE Keens, COT an ophthalmic technician. The creation of this record is the provider's dictation and/or activities during the visit.    Electronically signed by:  Wanda GEANNIE Keens, COT  07/08/23 6:47 PM  Redell JUDITHANN Hans, M.D., Ph.D. Diseases & Surgery of the Retina and Vitreous [facility] 07/08/2023  I have reviewed the above documentation for accuracy and completeness, and I agree with the above. Redell JUDITHANN Hans, M.D., Ph.D. 07/08/23 6:50 PM   Abbreviations: M myopia (nearsighted); A astigmatism; H hyperopia  (farsighted); P presbyopia; Mrx spectacle prescription;  CTL contact lenses; OD right eye; OS left eye; OU both eyes  XT exotropia; ET esotropia; PEK punctate epithelial keratitis; PEE punctate epithelial erosions; DES dry eye syndrome; MGD meibomian gland dysfunction; ATs artificial tears; PFAT's preservative free artificial tears; NSC nuclear sclerotic cataract; PSC posterior subcapsular cataract; ERM epi-retinal membrane; PVD posterior vitreous detachment; RD retinal detachment; DM diabetes mellitus; DR diabetic retinopathy; NPDR non-proliferative diabetic retinopathy; PDR proliferative diabetic retinopathy; CSME clinically significant macular edema; DME diabetic macular edema; dbh dot blot hemorrhages; CWS cotton wool spot; POAG primary open angle glaucoma; C/D cup-to-disc ratio; HVF humphrey visual field; GVF goldmann visual field; OCT optical coherence tomography; IOP intraocular pressure; BRVO Branch retinal vein occlusion; CRVO central retinal vein occlusion; CRAO central retinal artery occlusion; BRAO branch retinal artery occlusion; RT retinal tear; SB scleral buckle; PPV pars plana vitrectomy; VH Vitreous hemorrhage; PRP panretinal laser photocoagulation; IVK intravitreal kenalog; VMT vitreomacular traction; MH Macular hole;  NVD neovascularization of the disc; NVE neovascularization elsewhere; AREDS age related eye disease study; ARMD age related macular degeneration; POAG primary open angle glaucoma; EBMD epithelial/anterior basement membrane dystrophy; ACIOL anterior chamber intraocular lens; IOL intraocular lens; PCIOL posterior chamber intraocular lens; Phaco/IOL phacoemulsification with intraocular lens placement; PRK photorefractive keratectomy; LASIK laser assisted in situ keratomileusis; HTN hypertension; DM diabetes mellitus; COPD chronic obstructive pulmonary disease

## 2023-07-28 NOTE — Progress Notes (Signed)
  Cardiology Office Note:   Date:  07/30/2023  ID:  Justin Pennington, Justin Pennington 07/02/1961, MRN [id number] PCP: Rebekah Chesterfield, NP  [facility] Providers Cardiologist:  Rollene Rotunda, MD {  History of Present Illness:   Justin Pennington is a 62 y.o. male presents for evaluation of dyslipidemia.  He is referred by  [name], FNP.  He had significant cardiovascular risk factors He has cardiovascular risk factors but he has not had any cardiac work-up previously.  I sent him for coronary calcium score.  This demonstrated a score total of 331 with predominance in the right coronary artery.  This was 92nd percentile for his age.  He had a negative perfusion study in 2023.    Since I last saw him he has done well.  He exercises routinely.  He denies any cardiovascular symptoms. .cenies   ROS: As stated in the HPI and negative for all other systems.  Studies Reviewed:    EKG:     07/12/2023 sinus rhythm, rate 74, axis within normal limits, intervals within normal limits, no acute ST-T wave changes.  Poor anterior R wave progression    Risk Assessment/Calculations:              Physical Exam:   VS:  BP 128/70   Pulse 69   Ht 5\' 10"  (1.778 m)   Wt 190 lb (86.2 kg)   SpO2 96%   BMI 27.26 kg/m    Wt Readings from Last 3 Encounters:  07/30/23 190 lb (86.2 kg)  04/30/23 187 lb (84.8 kg)  12/25/22 183 lb 12.8 oz (83.4 kg)     GEN: Well nourished, well developed in no acute distress NECK: No JVD; No carotid bruits CARDIAC: RRR, no murmurs, rubs, gallops RESPIRATORY:  Clear to auscultation without rales, wheezing or rhonchi  ABDOMEN: Soft, non-tender, non-distended EXTREMITIES:  No edema; No deformity   ASSESSMENT AND PLAN:   Elevated coronary calcium: He has no new symptoms.  He is participating in aggressive risk reduction.  No change in therapy.  He will come back to see me if he has any symptoms or concerning questions.  Dyslipidemia: His LDL was well-controlled  and at target on Repatha with an LDL of 45.  No change in therapy.    Hypertension:   Blood pressure is well-controlled.  No change in therapy.   Follow up return as needed.  He will continue with aggressive primary risk reduction.  Signed, Rollene Rotunda, MD

## 2023-07-30 ENCOUNTER — Ambulatory Visit: Payer: Commercial Managed Care - PPO | Attending: Cardiology | Admitting: Cardiology

## 2023-07-30 ENCOUNTER — Encounter: Payer: Self-pay | Admitting: Cardiology

## 2023-07-30 VITALS — BP 128/70 | HR 69 | Ht 70.0 in | Wt 190.0 lb

## 2023-07-30 DIAGNOSIS — E785 Hyperlipidemia, unspecified: Secondary | ICD-10-CM

## 2023-07-30 DIAGNOSIS — R931 Abnormal findings on diagnostic imaging of heart and coronary circulation: Secondary | ICD-10-CM

## 2023-07-30 DIAGNOSIS — I10 Essential (primary) hypertension: Secondary | ICD-10-CM

## 2023-07-30 LAB — LIPID PANEL

## 2023-07-30 NOTE — Patient Instructions (Signed)
Medication Instructions:  No changes. *If you need a refill on your cardiac medications before your next appointment, please call your pharmacy*   Lab Work: Lpa, Lipid profile, BMP, CBC today If you have labs (blood work) drawn today and your tests are completely normal, you will receive your results only by: MyChart Message (if you have MyChart) OR A paper copy in the mail If you have any lab test that is abnormal or we need to change your treatment, we will call you to review the results.   Follow-Up: At [facility], you and your health needs are our priority.  As part of our continuing mission to provide you with exceptional heart care, we have created designated Provider Care Teams.  These Care Teams include your primary Cardiologist (physician) and Advanced Practice Providers (APPs -  Physician Assistants and Nurse Practitioners) who all work together to provide you with the care you need, when you need it.  We recommend signing up for the patient portal called "MyChart".  Sign up information is provided on this After Visit Summary.  MyChart is used to connect with patients for Virtual Visits (Telemedicine).  Patients are able to view lab/test results, encounter notes, upcoming appointments, etc.  Non-urgent messages can be sent to your provider as well.   To learn more about what you can do with MyChart, go to [email or web address].    Your next appointment:    As needed.   Provider:   Rollene Rotunda, MD

## 2023-08-01 LAB — BASIC METABOLIC PANEL
BUN/Creatinine Ratio: 11 (ref 10–24)
BUN: 13 mg/dL (ref 8–27)
CO2: 22 mmol/L (ref 20–29)
Calcium: 9.5 mg/dL (ref 8.6–10.2)
Chloride: 100 mmol/L (ref 96–106)
Creatinine, Ser: 1.17 mg/dL (ref 0.76–1.27)
Glucose: 93 mg/dL (ref 70–99)
Potassium: 5.1 mmol/L (ref 3.5–5.2)
Sodium: 138 mmol/L (ref 134–144)
eGFR: 70 mL/min/{1.73_m2} (ref >59)

## 2023-08-01 LAB — CBC
Hematocrit: 48 % (ref 37.5–51.0)
Hemoglobin: 16 g/dL (ref 13.0–17.7)
MCH: 28.7 pg (ref 26.6–33.0)
MCHC: 33.3 g/dL (ref 31.5–35.7)
MCV: 86 fL (ref 79–97)
Platelets: 196 10*3/uL (ref 150–450)
RBC: 5.57 x10E6/uL (ref 4.14–5.80)
RDW: 12.8 % (ref 11.6–15.4)
WBC: 7.1 10*3/uL (ref 3.4–10.8)

## 2023-08-01 LAB — LIPID PANEL
Cholesterol, Total: 100 mg/dL (ref 100–199)
HDL: 45 mg/dL (ref >39)
LDL CALC COMMENT:: 2.2 ratio (ref 0.0–5.0)
LDL Chol Calc (NIH): 28 mg/dL (ref 0–99)
Triglycerides: 163 mg/dL — ABNORMAL HIGH (ref 0–149)
VLDL Cholesterol Cal: 27 mg/dL (ref 5–40)

## 2023-08-01 LAB — LIPOPROTEIN A (LPA)

## 2023-08-03 NOTE — Progress Notes (Signed)
[facility] - Clinic Note  08/05/2023   CHIEF COMPLAINT Patient presents for Retina Follow Up  HISTORY OF PRESENT ILLNESS: Justin Pennington is a 63 y.o. male who presents to the clinic today for:  HPI     Retina Follow Up   Patient presents with  Dry AMD.  In right eye.  This started 4 weeks ago.  Duration of 4 weeks.  I, the attending physician,  performed the HPI with the patient and updated documentation appropriately.        Comments   Patient feels the vision is the same. He is using AT's.      Last edited by Rennis Chris, MD on 08/05/2023 10:33 AM.    Patient feels like his vision may be a little better  Referring physician: Olivia Canter, MD [address] [place],  [place] [address]  HISTORICAL INFORMATION:  Selected notes from the MEDICAL RECORD NUMBER Referred by Dr. Laruth Bouchard for ex ARMD OS LEE:  Ocular Hx- PMH-   CURRENT MEDICATIONS: No current outpatient medications on file. (Ophthalmic Drugs)   No current facility-administered medications for this visit. (Ophthalmic Drugs)   Current Outpatient Medications (Other)  Medication Sig   cetirizine (ZYRTEC) 10 MG tablet Take 1 tablet (10 mg total) by mouth daily.   Evolocumab (REPATHA SURECLICK) 140 MG/ML SOAJ Inject 1 mL into the skin every 14 (fourteen) days.   finasteride (PROPECIA) 1 MG tablet Take 1 mg by mouth daily.   levalbuterol (XOPENEX HFA) 45 MCG/ACT inhaler Inhale 2 puffs every 6 hours as needed   TRELEGY ELLIPTA 100-62.5-25 MCG/ACT AEPB INHALE 1 PUFF BY MOUTH EVERY DAY   No current facility-administered medications for this visit. (Other)   REVIEW OF SYSTEMS: ROS   Positive for: Eyes Last edited by Charlette Caffey, COT on 08/05/2023  7:49 AM.      ALLERGIES Allergies  Allergen Reactions   Crestor [Rosuvastatin]     Joint stiffness   Lipitor [Atorvastatin Calcium] Other (See Comments)    Joint stiffness   Zetia [Ezetimibe] Other (See Comments)     "Muscle aches"   PAST MEDICAL HISTORY Past Medical History:  Diagnosis Date   Allergic conjunctivitis    Chronic obstructive asthma, unspecified    History of adenomatous polyp of colon 2005   Less than 1 cm   Hyperlipidemia    IBS (irritable bowel syndrome)    Diarrhea predominant   Insomnia, unspecified    Past Surgical History:  Procedure Laterality Date   COLONOSCOPY     GINGIVAL GRAFT     LEFT MENISCUS TEAR -ARTHROSCOPIC REPAIR     FAMILY HISTORY Family History  Problem Relation Age of Onset   Diabetes Mother        borderline diabetes   Cancer Father        BLADDER   Asthma Brother    Colon cancer Maternal Aunt 60   Stomach cancer Neg Hx    Colon polyps Neg Hx    Esophageal cancer Neg Hx    Rectal cancer Neg Hx    SOCIAL HISTORY Social History   Tobacco Use   Smoking status: Never   Smokeless tobacco: Never  Vaping Use   Vaping status: Never Used  Substance Use Topics   Alcohol use: Yes    Comment: occasional   Drug use: No       OPHTHALMIC EXAM:  Base Eye Exam     Visual Acuity (Snellen -  Linear)       Right Left   Dist [place] 20/20 +2 20/100   Dist ph [place]  20/50 +2         Tonometry (Tonopen, 7:52 AM)       Right Left   Pressure 17 19         Pupils       Dark Light Shape React APD   Right 3 2 Round Brisk None   Left 3 2 Round Brisk None         Visual Fields       Left Right    Full Full         Extraocular Movement       Right Left    Full, Ortho Full, Ortho         Neuro/Psych     Oriented x3: Yes   Mood/Affect: Normal         Dilation     Both eyes: 1.0% Mydriacyl, 2.5% Phenylephrine @ 7:49 AM           Slit Lamp and Fundus Exam     External Exam       Right Left   External Normal Normal         Slit Lamp Exam       Right Left   Lids/Lashes Dermatochalasis - upper lid Dermatochalasis - upper lid   Conjunctiva/Sclera White and quiet White and quiet   Cornea Arcus, focal corneal haze  0400 Arcus   Anterior Chamber Deep and clear Deep and clear   Iris Round and dilated Round and dilated   Lens 2-3+ Nuclear sclerosis, 2+ Cortical cataract 2-3+ Nuclear sclerosis, 2+ Cortical cataract   Anterior Vitreous Vitreous syneresis Normal         Fundus Exam       Right Left   Disc Pink and sharp Pink and sharp   C/D Ratio 0.2 0.2   Macula Flat, good foveal reflex, Drusen, RPE mottling and clumping, No heme or edema Blunted foveal reflex, central CNV with edema -- improved, punctate heme -- improved, fine drusen   Vessels Vascular attenuation, Tortuous Vascular attenuation, Tortuous   Periphery Attached, No heme Attached, No heme           IMAGING AND PROCEDURES  Imaging and Procedures for 08/05/2023  OCT, Retina - OU - Both Eyes       Right Eye Quality was good. Central Foveal Thickness: 302. Progression has been stable. Findings include normal foveal contour, no IRF, no SRF, retinal drusen , pigment epithelial detachment, vitreomacular adhesion .   Left Eye Quality was good. Central Foveal Thickness: 519. Progression has improved. Findings include abnormal foveal contour, subretinal hyper-reflective material, choroidal neovascular membrane, pigment epithelial detachment, vitreomacular adhesion (Mild interval improvement in large central CNV with surrounding IRF/SRF).   Notes *Images captured and stored on drive  Diagnosis / Impression:  OD: Non Ex. AMD - retinal drusen OS: exudative ARMD - mild interval improvement in large central CNV with surrounding IRF/SRF  Clinical management:  See below  Abbreviations: NFP - Normal foveal profile. CME - cystoid macular edema. PED - pigment epithelial detachment. IRF - intraretinal fluid. SRF - subretinal fluid. EZ - ellipsoid zone. ERM - epiretinal membrane. ORA - outer retinal atrophy. ORT - outer retinal tubulation. SRHM - subretinal hyper-reflective material. IRHM - intraretinal hyper-reflective material       Intravitreal Injection, Pharmacologic Agent - OS - Left Eye  Time Out 08/05/2023. 8:42 AM. Confirmed correct patient, procedure, site, and patient consented.   Anesthesia Topical anesthesia was used. Anesthetic medications included Lidocaine 2%, Proparacaine 0.5%.   Procedure Preparation included 5% betadine to ocular surface, eyelid speculum. A supplied needle was used.   Injection: 1.25 mg Bevacizumab 1.25mg /0.25ml   Route: Intravitreal, Site: Left Eye   NDC: P3213405, Lot: [id number], Expiration date: 09/03/2023   Post-op Post injection exam found visual acuity of at least counting fingers. The patient tolerated the procedure well. There were no complications. The patient received written and verbal post procedure care education.           ASSESSMENT/PLAN:   ICD-10-CM   1. Exudative age-related macular degeneration of left eye with active choroidal neovascularization (HCC)  H35.3221 Intravitreal Injection, Pharmacologic Agent - OS - Left Eye    Bevacizumab (AVASTIN) SOLN 1.25 mg    2. 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, OS  - s/p IVA OS #1 (01.02.25)  - pt reports 1 mo history of blurred vision and distortion, affecting his golfing - BCVA OS 20/50 -- stable  - OCT OS shows: interval improvement in large central CNV with surrounding IRF/SRF  - recommend IVA OS #2 today 01.30.25 w/ f/u in 4 wks - pt wishes to be treated with IVA - RBA of procedure discussed, questions answered - IVA informed consent obtained and signed 01.02.25 - see procedure note  - f/u in 4 wks -- DFE/OCT, possible injection   2. Age related macular degeneration, non-exudative, OD  - The incidence, anatomy, and pathology of dry AMD, risk of progression, and the AREDS and AREDS 2 studies including smoking  risks discussed with patient.  - Recommend amsler grid monitoring  3.  Mixed Cataract OU - The symptoms of cataract, surgical options, and treatments and risks were discussed with patient. - discussed diagnosis and progression - under the expert care of [facility] - monitor  Ophthalmic Meds Ordered this visit:  Meds ordered this encounter  Medications   Bevacizumab (AVASTIN) SOLN 1.25 mg     Return in about 4 weeks (around 09/02/2023) for f/u exu ARMD OS, DFE, OCT.  There are no Patient Instructions on file for this visit.  Explained the diagnoses, plan, and follow up with the patient and they expressed understanding.  Patient expressed understanding of the importance of proper follow up care.   This document serves as a record of services personally performed by Karie Chimera, MD, PhD. It was created on their behalf by Annalee Genta, COMT. The creation of this record is the provider's dictation and/or activities during the visit.  Electronically signed by: Annalee Genta, COMT 08/05/23 10:33 AM  This document serves as a record of services personally performed by Karie Chimera, MD, PhD. It was created on their behalf by Glee Arvin. Manson Passey, OA an ophthalmic technician. The creation of this record is the provider's dictation and/or activities during the visit.    Electronically signed by: Glee Arvin. Manson Passey, OA 08/05/23 10:33 AM  Karie Chimera, M.D., Ph.D. Diseases & Surgery of the Retina and Vitreous [facility]  I have reviewed the above documentation for accuracy and completeness, and I agree with the above. Karie Chimera, M.D., Ph.D. 08/05/23 10:34 AM  Abbreviations: M myopia (nearsighted); A astigmatism; H hyperopia (farsighted); P presbyopia; Mrx spectacle prescription;  CTL contact lenses; OD right eye; OS left eye; OU both eyes  XT exotropia; ET esotropia; PEK punctate epithelial keratitis; PEE punctate epithelial erosions; DES dry eye syndrome;  MGD meibomian gland dysfunction; ATs artificial tears; PFAT's preservative free artificial tears; NSC nuclear sclerotic cataract; PSC posterior subcapsular cataract; ERM epi-retinal membrane; PVD posterior vitreous detachment; RD retinal detachment; DM diabetes mellitus; DR diabetic retinopathy; NPDR non-proliferative diabetic retinopathy; PDR proliferative diabetic retinopathy; CSME clinically significant macular edema; DME diabetic macular edema; dbh dot blot hemorrhages; CWS cotton wool spot; POAG primary open angle glaucoma; C/D cup-to-disc ratio; HVF humphrey visual field; GVF goldmann visual field; OCT optical coherence tomography; IOP intraocular pressure; BRVO Branch retinal vein occlusion; CRVO central retinal vein occlusion; CRAO central retinal artery occlusion; BRAO branch retinal artery occlusion; RT retinal tear; SB scleral buckle; PPV pars plana vitrectomy; VH Vitreous hemorrhage; PRP panretinal laser photocoagulation; IVK intravitreal kenalog; VMT vitreomacular traction; MH Macular hole;  NVD neovascularization of the disc; NVE neovascularization elsewhere; AREDS age related eye disease study; ARMD age related macular degeneration; POAG primary open angle glaucoma; EBMD epithelial/anterior basement membrane dystrophy; ACIOL anterior chamber intraocular lens; IOL intraocular lens; PCIOL posterior chamber intraocular lens; Phaco/IOL phacoemulsification with intraocular lens placement; PRK photorefractive keratectomy; LASIK laser assisted in situ keratomileusis; HTN hypertension; DM diabetes mellitus; COPD chronic obstructive pulmonary disease

## 2023-08-05 ENCOUNTER — Encounter (INDEPENDENT_AMBULATORY_CARE_PROVIDER_SITE_OTHER): Payer: Self-pay | Admitting: Ophthalmology

## 2023-08-05 ENCOUNTER — Ambulatory Visit (INDEPENDENT_AMBULATORY_CARE_PROVIDER_SITE_OTHER): Payer: Commercial Managed Care - PPO | Admitting: Ophthalmology

## 2023-08-05 DIAGNOSIS — H353111 Nonexudative age-related macular degeneration, right eye, early dry stage: Secondary | ICD-10-CM

## 2023-08-05 DIAGNOSIS — H25013 Cortical age-related cataract, bilateral: Secondary | ICD-10-CM | POA: Diagnosis not present

## 2023-08-05 DIAGNOSIS — H353221 Exudative age-related macular degeneration, left eye, with active choroidal neovascularization: Secondary | ICD-10-CM | POA: Diagnosis not present

## 2023-08-05 MED ORDER — BEVACIZUMAB CHEMO INJECTION 1.25MG/0.05ML SYRINGE FOR KALEIDOSCOPE
1.2500 mg | INTRAVITREAL | Status: AC | PRN
  Administered 2023-08-05: 1.25 mg via INTRAVITREAL

## 2023-08-31 NOTE — Progress Notes (Signed)
 [facility] - Clinic Note  09/02/2023   CHIEF COMPLAINT Patient presents for Retina Follow Up  HISTORY OF PRESENT ILLNESS: Justin Pennington is a 63 y.o. male who presents to the clinic today for:  HPI     Retina Follow Up   Patient presents with  Dry AMD.  In right eye.  This started 4 weeks ago.  Duration of 4 weeks.  I, the attending physician,  performed the HPI with the patient and updated documentation appropriately.        Comments   Patient feels the vision is getting a little better. He is not using eye drops at this time.       Last edited by Rennis Chris, MD on 09/02/2023  5:06 PM.    Pt feels like his vision is a little better, he played golf yesterday, it was a little difficult to see the ball   Referring physician: Olivia Canter, MD [address] [place],  [place] [address]  HISTORICAL INFORMATION:  Selected notes from the MEDICAL RECORD NUMBER Referred by Dr. Laruth Bouchard for ex ARMD OS LEE:  Ocular Hx- PMH-   CURRENT MEDICATIONS: No current outpatient medications on file. (Ophthalmic Drugs)   No current facility-administered medications for this visit. (Ophthalmic Drugs)   Current Outpatient Medications (Other)  Medication Sig   cetirizine (ZYRTEC) 10 MG tablet Take 1 tablet (10 mg total) by mouth daily.   Evolocumab (REPATHA SURECLICK) 140 MG/ML SOAJ Inject 1 mL into the skin every 14 (fourteen) days.   finasteride (PROPECIA) 1 MG tablet Take 1 mg by mouth daily.   levalbuterol (XOPENEX HFA) 45 MCG/ACT inhaler Inhale 2 puffs every 6 hours as needed   TRELEGY ELLIPTA 100-62.5-25 MCG/ACT AEPB INHALE 1 PUFF BY MOUTH EVERY DAY   No current facility-administered medications for this visit. (Other)   REVIEW OF SYSTEMS: ROS   Positive for: Eyes Last edited by Charlette Caffey, COT on 09/02/2023  8:31 AM.       ALLERGIES Allergies  Allergen Reactions   Crestor [Rosuvastatin]     Joint stiffness   Lipitor  [Atorvastatin Calcium] Other (See Comments)    Joint stiffness   Zetia [Ezetimibe] Other (See Comments)    "Muscle aches"   PAST MEDICAL HISTORY Past Medical History:  Diagnosis Date   Allergic conjunctivitis    Chronic obstructive asthma, unspecified    History of adenomatous polyp of colon 2005   Less than 1 cm   Hyperlipidemia    IBS (irritable bowel syndrome)    Diarrhea predominant   Insomnia, unspecified    Past Surgical History:  Procedure Laterality Date   COLONOSCOPY     GINGIVAL GRAFT     LEFT MENISCUS TEAR -ARTHROSCOPIC REPAIR     FAMILY HISTORY Family History  Problem Relation Age of Onset   Diabetes Mother        borderline diabetes   Cancer Father        BLADDER   Asthma Brother    Colon cancer Maternal Aunt 60   Stomach cancer Neg Hx    Colon polyps Neg Hx    Esophageal cancer Neg Hx    Rectal cancer Neg Hx    SOCIAL HISTORY Social History   Tobacco Use   Smoking status: Never   Smokeless tobacco: Never  Vaping Use   Vaping status: Never Used  Substance Use Topics   Alcohol use: Yes    Comment: occasional  Drug use: No       OPHTHALMIC EXAM:  Base Eye Exam     Visual Acuity (Snellen - Linear)       Right Left   Dist [place] 20/20 +2 20/80 +1   Dist ph [place]  20/40         Tonometry (Tonopen, 8:33 AM)       Right Left   Pressure 16 16         Pupils       Dark Light Shape React APD   Right 3 2 Round Brisk None   Left 3 2 Round Brisk None         Visual Fields       Left Right    Full Full         Extraocular Movement       Right Left    Full, Ortho Full, Ortho         Neuro/Psych     Oriented x3: Yes   Mood/Affect: Normal         Dilation     Both eyes: 1.0% Mydriacyl, 2.5% Phenylephrine @ 8:31 AM           Slit Lamp and Fundus Exam     External Exam       Right Left   External Normal Normal         Slit Lamp Exam       Right Left   Lids/Lashes Dermatochalasis - upper lid  Dermatochalasis - upper lid   Conjunctiva/Sclera White and quiet White and quiet   Cornea Arcus, focal corneal haze 0400 Arcus   Anterior Chamber Deep and clear Deep and clear   Iris Round and dilated Round and dilated   Lens 2-3+ Nuclear sclerosis, 2+ Cortical cataract 2-3+ Nuclear sclerosis, 2+ Cortical cataract   Anterior Vitreous Vitreous syneresis Normal         Fundus Exam       Right Left   Disc Pink and sharp Pink and sharp   C/D Ratio 0.2 0.2   Macula Flat, good foveal reflex, Drusen, RPE mottling and clumping, No heme or edema Blunted foveal reflex, central CNV with edema -- improved, punctate heme -- improved, fine drusen   Vessels attenuated, mild tortuosity Vascular attenuation, Tortuous   Periphery Attached, No heme Attached, No heme           IMAGING AND PROCEDURES  Imaging and Procedures for 09/02/2023  OCT, Retina - OU - Both Eyes       Right Eye Quality was good. Central Foveal Thickness: 302. Progression has been stable. Findings include normal foveal contour, no IRF, no SRF, retinal drusen , pigment epithelial detachment, vitreomacular adhesion .   Left Eye Quality was good. Central Foveal Thickness: 434. Progression has improved. Findings include abnormal foveal contour, subretinal hyper-reflective material, choroidal neovascular membrane, pigment epithelial detachment, vitreomacular adhesion (Mild interval improvement in large central CNV and surrounding IRF/SRF).   Notes *Images captured and stored on drive  Diagnosis / Impression:  OD: Non Ex. AMD - retinal drusen OS: exudative ARMD - mild interval improvement in large central CNV and surrounding IRF/SRF  Clinical management:  See below  Abbreviations: NFP - Normal foveal profile. CME - cystoid macular edema. PED - pigment epithelial detachment. IRF - intraretinal fluid. SRF - subretinal fluid. EZ - ellipsoid zone. ERM - epiretinal membrane. ORA - outer retinal atrophy. ORT - outer retinal  tubulation. SRHM - subretinal hyper-reflective material. IRHM -  intraretinal hyper-reflective material      Intravitreal Injection, Pharmacologic Agent - OS - Left Eye       Time Out 09/02/2023. 9:29 AM. Confirmed correct patient, procedure, site, and patient consented.   Anesthesia Topical anesthesia was used. Anesthetic medications included Lidocaine 2%, Proparacaine 0.5%.   Procedure Preparation included 5% betadine to ocular surface, eyelid speculum. A supplied needle was used.   Injection: 1.25 mg Bevacizumab 1.25mg /0.78ml   Route: Intravitreal, Site: Left Eye   NDC: [id number], Lot: [id number], Expiration date: 10/09/2023   Post-op Post injection exam found visual acuity of at least counting fingers. The patient tolerated the procedure well. There were no complications. The patient received written and verbal post procedure care education.            ASSESSMENT/PLAN:   ICD-10-CM   1. Exudative age-related macular degeneration of left eye with active choroidal neovascularization (HCC)  H35.3221 OCT, Retina - OU - Both Eyes    Intravitreal Injection, Pharmacologic Agent - OS - Left Eye    Bevacizumab (AVASTIN) SOLN 1.25 mg    2. 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, OS  - s/p IVA OS #1 (01.02.25), #2 (01.30.25)  - pt reports 1 mo history of blurred vision and distortion, affecting his golfing - BCVA OS 20/50 -- stable  - OCT OS shows: interval improvement in large central CNV with surrounding IRF/SRF  **discussed decreased efficacy / resistance to Avastin and potential benefit of switching medication**  - recommend IVA OS #3 today 02.27.25 w/ f/u in 4 wks - pt wishes to be treated with IVA - RBA of procedure discussed, questions answered - IVA informed consent obtained and signed 01.02.25 - see procedure  note - will check Eylea auth for next visit  - f/u in 4 wks -- DFE/OCT, possible injection   2. Age related macular degeneration, non-exudative, OD  - The incidence, anatomy, and pathology of dry AMD, risk of progression, and the AREDS and AREDS 2 studies including smoking risks discussed with patient.  - recommend Amsler grid monitoring  3.  Mixed Cataract OU - The symptoms of cataract, surgical options, and treatments and risks were discussed with patient. - discussed diagnosis and progression - under the expert care of [facility] - monitor  Ophthalmic Meds Ordered this visit:  Meds ordered this encounter  Medications   Bevacizumab (AVASTIN) SOLN 1.25 mg     Return in about 4 weeks (around 09/30/2023) for f/u exu ARMD OS, DFE, OCT.  There are no Patient Instructions on file for this visit.  Explained the diagnoses, plan, and follow up with the patient and they expressed understanding.  Patient expressed understanding of the importance of proper follow up care.   This document serves as a record of services personally performed by Karie Chimera, MD, PhD. It was created on their behalf by Annalee Genta, COMT. The creation of this record is the provider's dictation and/or activities during the visit.  Electronically signed by: Annalee Genta, COMT 09/02/23 5:06 PM  This document serves as a record of services personally performed by Karie Chimera, MD, PhD. It was created on their behalf by Glee Arvin. Manson Passey, OA an ophthalmic technician. The creation of this record is the provider's dictation and/or activities during the visit.    Electronically signed by: Glee Arvin.  Manson Passey, OA 09/02/23 5:06 PM  Karie Chimera, M.D., Ph.D. Diseases & Surgery of the Retina and Vitreous [facility]  I have reviewed the above documentation for accuracy and completeness, and I agree with the above. Karie Chimera, M.D., Ph.D. 09/02/23 5:06 PM  Abbreviations: M myopia  (nearsighted); A astigmatism; H hyperopia (farsighted); P presbyopia; Mrx spectacle prescription;  CTL contact lenses; OD right eye; OS left eye; OU both eyes  XT exotropia; ET esotropia; PEK punctate epithelial keratitis; PEE punctate epithelial erosions; DES dry eye syndrome; MGD meibomian gland dysfunction; ATs artificial tears; PFAT's preservative free artificial tears; NSC nuclear sclerotic cataract; PSC posterior subcapsular cataract; ERM epi-retinal membrane; PVD posterior vitreous detachment; RD retinal detachment; DM diabetes mellitus; DR diabetic retinopathy; NPDR non-proliferative diabetic retinopathy; PDR proliferative diabetic retinopathy; CSME clinically significant macular edema; DME diabetic macular edema; dbh dot blot hemorrhages; CWS cotton wool spot; POAG primary open angle glaucoma; C/D cup-to-disc ratio; HVF humphrey visual field; GVF goldmann visual field; OCT optical coherence tomography; IOP intraocular pressure; BRVO Branch retinal vein occlusion; CRVO central retinal vein occlusion; CRAO central retinal artery occlusion; BRAO branch retinal artery occlusion; RT retinal tear; SB scleral buckle; PPV pars plana vitrectomy; VH Vitreous hemorrhage; PRP panretinal laser photocoagulation; IVK intravitreal kenalog; VMT vitreomacular traction; MH Macular hole;  NVD neovascularization of the disc; NVE neovascularization elsewhere; AREDS age related eye disease study; ARMD age related macular degeneration; POAG primary open angle glaucoma; EBMD epithelial/anterior basement membrane dystrophy; ACIOL anterior chamber intraocular lens; IOL intraocular lens; PCIOL posterior chamber intraocular lens; Phaco/IOL phacoemulsification with intraocular lens placement; PRK photorefractive keratectomy; LASIK laser assisted in situ keratomileusis; HTN hypertension; DM diabetes mellitus; COPD chronic obstructive pulmonary disease

## 2023-09-02 ENCOUNTER — Encounter (INDEPENDENT_AMBULATORY_CARE_PROVIDER_SITE_OTHER): Payer: Self-pay | Admitting: Ophthalmology

## 2023-09-02 ENCOUNTER — Ambulatory Visit (INDEPENDENT_AMBULATORY_CARE_PROVIDER_SITE_OTHER): Payer: Commercial Managed Care - PPO | Admitting: Ophthalmology

## 2023-09-02 DIAGNOSIS — H25013 Cortical age-related cataract, bilateral: Secondary | ICD-10-CM | POA: Diagnosis not present

## 2023-09-02 DIAGNOSIS — H353221 Exudative age-related macular degeneration, left eye, with active choroidal neovascularization: Secondary | ICD-10-CM | POA: Diagnosis not present

## 2023-09-02 DIAGNOSIS — H353111 Nonexudative age-related macular degeneration, right eye, early dry stage: Secondary | ICD-10-CM

## 2023-09-02 MED ORDER — BEVACIZUMAB CHEMO INJECTION 1.25MG/0.05ML SYRINGE FOR KALEIDOSCOPE
1.2500 mg | INTRAVITREAL | Status: AC | PRN
  Administered 2023-09-02: 1.25 mg via INTRAVITREAL

## 2023-09-29 NOTE — Progress Notes (Signed)
 [facility] - Clinic Note  09/30/2023   CHIEF COMPLAINT Patient presents for Retina Follow Up  HISTORY OF PRESENT ILLNESS: Justin Pennington is a 63 y.o. male who presents to the clinic today for:  HPI     Retina Follow Up   Patient presents with  Dry AMD.  In right eye.  This started 4 weeks ago.  Duration of 4 weeks.  I, the attending physician,  performed the HPI with the patient and updated documentation appropriately.        Comments   Patient feels the vision may be a little better but its still like looking through saran wrap. He is not using eye drops at this time.       Last edited by Rennis Chris, MD on 09/30/2023  9:06 AM.    Pt feels like his vision is a little better, he played golf yesterday, it was a little difficult to see the ball   Referring physician: Olivia Canter, MD [address] [place],  [place] [address]  HISTORICAL INFORMATION:  Selected notes from the MEDICAL RECORD NUMBER Referred by Dr. Laruth Bouchard for ex ARMD OS LEE:  Ocular Hx- PMH-   CURRENT MEDICATIONS: No current outpatient medications on file. (Ophthalmic Drugs)   No current facility-administered medications for this visit. (Ophthalmic Drugs)   Current Outpatient Medications (Other)  Medication Sig   cetirizine (ZYRTEC) 10 MG tablet Take 1 tablet (10 mg total) by mouth daily.   Evolocumab (REPATHA SURECLICK) 140 MG/ML SOAJ Inject 1 mL into the skin every 14 (fourteen) days.   finasteride (PROPECIA) 1 MG tablet Take 1 mg by mouth daily.   levalbuterol (XOPENEX HFA) 45 MCG/ACT inhaler Inhale 2 puffs every 6 hours as needed   TRELEGY ELLIPTA 100-62.5-25 MCG/ACT AEPB INHALE 1 PUFF BY MOUTH EVERY DAY   No current facility-administered medications for this visit. (Other)   REVIEW OF SYSTEMS: ROS   Positive for: Eyes Last edited by Charlette Caffey, COT on 09/30/2023  8:05 AM.     ALLERGIES Allergies  Allergen Reactions   Crestor [Rosuvastatin]      Joint stiffness   Lipitor [Atorvastatin Calcium] Other (See Comments)    Joint stiffness   Zetia [Ezetimibe] Other (See Comments)    "Muscle aches"   PAST MEDICAL HISTORY Past Medical History:  Diagnosis Date   Allergic conjunctivitis    Chronic obstructive asthma, unspecified    History of adenomatous polyp of colon 2005   Less than 1 cm   Hyperlipidemia    IBS (irritable bowel syndrome)    Diarrhea predominant   Insomnia, unspecified    Past Surgical History:  Procedure Laterality Date   COLONOSCOPY     GINGIVAL GRAFT     LEFT MENISCUS TEAR -ARTHROSCOPIC REPAIR     FAMILY HISTORY Family History  Problem Relation Age of Onset   Diabetes Mother        borderline diabetes   Cancer Father        BLADDER   Asthma Brother    Colon cancer Maternal Aunt 71   Stomach cancer Neg Hx    Colon polyps Neg Hx    Esophageal cancer Neg Hx    Rectal cancer Neg Hx    SOCIAL HISTORY Social History   Tobacco Use   Smoking status: Never   Smokeless tobacco: Never  Vaping Use   Vaping status: Never Used  Substance Use Topics   Alcohol use: Yes  Comment: occasional   Drug use: No       OPHTHALMIC EXAM:  Base Eye Exam     Visual Acuity (Snellen - Linear)       Right Left   Dist [place] 20/20 20/100   Dist ph [place]  20/40         Tonometry (Tonopen, 8:11 AM)       Right Left   Pressure 15 18         Pupils       Dark Light Shape React APD   Right 3 2 Round Brisk None   Left 3 2 Round Brisk None         Visual Fields       Left Right    Full Full         Extraocular Movement       Right Left    Full, Ortho Full, Ortho         Neuro/Psych     Oriented x3: Yes   Mood/Affect: Normal         Dilation     Both eyes: 1.0% Mydriacyl, 2.5% Phenylephrine @ 8:06 AM           Slit Lamp and Fundus Exam     External Exam       Right Left   External Normal Normal         Slit Lamp Exam       Right Left   Lids/Lashes Dermatochalasis -  upper lid Dermatochalasis - upper lid   Conjunctiva/Sclera White and quiet White and quiet   Cornea Arcus, focal corneal haze 0400 Arcus   Anterior Chamber Deep and clear Deep and clear   Iris Round and dilated Round and dilated   Lens 2-3+ Nuclear sclerosis, 2+ Cortical cataract 2-3+ Nuclear sclerosis, 2+ Cortical cataract   Anterior Vitreous Vitreous syneresis Normal         Fundus Exam       Right Left   Disc Pink and sharp Pink and sharp   C/D Ratio 0.2 0.2   Macula Flat, good foveal reflex, Drusen, RPE mottling and clumping, No heme or edema Blunted foveal reflex, central CNV with edema -- slightly improved, punctate heme -- improved / resolved, fine drusen   Vessels attenuated, mild tortuosity Vascular attenuation, attenuated, mild tortuosity   Periphery Attached, No heme Attached, No heme           Refraction     Manifest Refraction       Sphere Cylinder Axis Dist VA   Right       Left +1.50 +0.50 090 20/25           IMAGING AND PROCEDURES  Imaging and Procedures for 09/30/2023  OCT, Retina - OU - Both Eyes       Right Eye Quality was good. Central Foveal Thickness: 307. Progression has been stable. Findings include normal foveal contour, no IRF, no SRF, retinal drusen , pigment epithelial detachment, vitreomacular adhesion .   Left Eye Quality was good. Central Foveal Thickness: 445. Progression has improved. Findings include abnormal foveal contour, subretinal hyper-reflective material, choroidal neovascular membrane, pigment epithelial detachment, vitreomacular adhesion (large central CNV with persistent surrounding IRF/SRF -- slightly improved).   Notes *Images captured and stored on drive  Diagnosis / Impression:  OD: Non Ex. AMD - retinal drusen OS: exudative ARMD - large central CNV with persistent surrounding IRF/SRF -- slightly improved  Clinical management:  See below  Abbreviations:  NFP - Normal foveal profile. CME - cystoid macular edema.  PED - pigment epithelial detachment. IRF - intraretinal fluid. SRF - subretinal fluid. EZ - ellipsoid zone. ERM - epiretinal membrane. ORA - outer retinal atrophy. ORT - outer retinal tubulation. SRHM - subretinal hyper-reflective material. IRHM - intraretinal hyper-reflective material      Intravitreal Injection, Pharmacologic Agent - OS - Left Eye       Time Out 09/30/2023. 8:45 AM. Confirmed correct patient, procedure, site, and patient consented.   Anesthesia Topical anesthesia was used. Anesthetic medications included Lidocaine 2%, Proparacaine 0.5%.   Procedure Preparation included 5% betadine to ocular surface, eyelid speculum. A (32g) needle was used.   Injection: 2 mg aflibercept 2 MG/0.05ML   Route: Intravitreal, Site: Left Eye   NDC: L6038910, Lot: [id number], Expiration date: 12/02/2024, Waste: 0 mL   Post-op Post injection exam found visual acuity of at least counting fingers. The patient tolerated the procedure well. There were no complications. The patient received written and verbal post procedure care education.           ASSESSMENT/PLAN:   ICD-10-CM   1. Exudative age-related macular degeneration of left eye with active choroidal neovascularization (HCC)  H35.3221 OCT, Retina - OU - Both Eyes    Intravitreal Injection, Pharmacologic Agent - OS - Left Eye    aflibercept (EYLEA) SOLN 2 mg    2. 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 Cortical age-related cataract of both eyes  H25.013       1. 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, OS  - s/p IVA OS #1 (01.02.25), #2 (01.30.25), #3 (02.27.25) -- IVA resistance  - pt reports 1 mo history of blurred vision and distortion, affecting his golfing - BCVA OS 20/40 stable  - OCT OS shows: large central CNV with persistent surrounding IRF/SRF -- slightly improved  **discussed decreased efficacy / resistance to Avastin and potential benefit of switching  medication to [facility]**   - recommend switching to IVE OS #1 today 03.27.25 w/ f/u in 4 wks - pt wishes to be treated with IVE - RBA of procedure discussed, questions answered - IVe informed consent obtained and signed 03.27.25 - see procedure note - Eylea approved for one eye only  - f/u in 4 wks -- DFE/OCT, possible injection   2. Age related macular degeneration, non-exudative, OD  - The incidence, anatomy, and pathology of dry AMD, risk of progression, and the AREDS and AREDS 2 studies including smoking risks discussed with patient.  - recommend Amsler grid monitoring  3.  Mixed Cataract OU - The symptoms of cataract, surgical options, and treatments and risks were discussed with patient. - discussed diagnosis and progression - under the expert care of [facility] - monitor  Ophthalmic Meds Ordered this visit:  Meds ordered this encounter  Medications   aflibercept (EYLEA) SOLN 2 mg     Return in about 4 weeks (around 10/28/2023) for f/u exu ARMD OS, DFE, OCT.  There are no Patient Instructions on file for this visit.  Explained the diagnoses, plan, and follow up with the patient and they expressed understanding.  Patient expressed understanding of the importance of proper follow up care.   This document serves as a record of services personally performed by Karie Chimera, MD, PhD. It was created on their behalf by Annalee Genta, COMT. The creation of this record is the provider's dictation and/or activities during the visit.  Electronically signed by: Annalee Genta, COMT 09/30/23 9:08 AM  Karie Chimera, M.D., Ph.D. Diseases & Surgery of the Retina and Vitreous [facility]  I have reviewed the above documentation for accuracy and completeness, and I agree with the above. Karie Chimera, M.D., Ph.D. 09/30/23 9:09 AM   Abbreviations: M myopia (nearsighted); A astigmatism; H hyperopia (farsighted); P presbyopia; Mrx spectacle prescription;  CTL  contact lenses; OD right eye; OS left eye; OU both eyes  XT exotropia; ET esotropia; PEK punctate epithelial keratitis; PEE punctate epithelial erosions; DES dry eye syndrome; MGD meibomian gland dysfunction; ATs artificial tears; PFAT's preservative free artificial tears; NSC nuclear sclerotic cataract; PSC posterior subcapsular cataract; ERM epi-retinal membrane; PVD posterior vitreous detachment; RD retinal detachment; DM diabetes mellitus; DR diabetic retinopathy; NPDR non-proliferative diabetic retinopathy; PDR proliferative diabetic retinopathy; CSME clinically significant macular edema; DME diabetic macular edema; dbh dot blot hemorrhages; CWS cotton wool spot; POAG primary open angle glaucoma; C/D cup-to-disc ratio; HVF humphrey visual field; GVF goldmann visual field; OCT optical coherence tomography; IOP intraocular pressure; BRVO Branch retinal vein occlusion; CRVO central retinal vein occlusion; CRAO central retinal artery occlusion; BRAO branch retinal artery occlusion; RT retinal tear; SB scleral buckle; PPV pars plana vitrectomy; VH Vitreous hemorrhage; PRP panretinal laser photocoagulation; IVK intravitreal kenalog; VMT vitreomacular traction; MH Macular hole;  NVD neovascularization of the disc; NVE neovascularization elsewhere; AREDS age related eye disease study; ARMD age related macular degeneration; POAG primary open angle glaucoma; EBMD epithelial/anterior basement membrane dystrophy; ACIOL anterior chamber intraocular lens; IOL intraocular lens; PCIOL posterior chamber intraocular lens; Phaco/IOL phacoemulsification with intraocular lens placement; PRK photorefractive keratectomy; LASIK laser assisted in situ keratomileusis; HTN hypertension; DM diabetes mellitus; COPD chronic obstructive pulmonary disease

## 2023-09-30 ENCOUNTER — Encounter (INDEPENDENT_AMBULATORY_CARE_PROVIDER_SITE_OTHER): Payer: Self-pay | Admitting: Ophthalmology

## 2023-09-30 ENCOUNTER — Ambulatory Visit (INDEPENDENT_AMBULATORY_CARE_PROVIDER_SITE_OTHER): Payer: Commercial Managed Care - PPO | Admitting: Ophthalmology

## 2023-09-30 DIAGNOSIS — H353111 Nonexudative age-related macular degeneration, right eye, early dry stage: Secondary | ICD-10-CM | POA: Diagnosis not present

## 2023-09-30 DIAGNOSIS — H25013 Cortical age-related cataract, bilateral: Secondary | ICD-10-CM | POA: Diagnosis not present

## 2023-09-30 DIAGNOSIS — H353221 Exudative age-related macular degeneration, left eye, with active choroidal neovascularization: Secondary | ICD-10-CM

## 2023-09-30 MED ORDER — AFLIBERCEPT 2MG/0.05ML IZ SOLN FOR KALEIDOSCOPE
2.0000 mg | INTRAVITREAL | Status: AC | PRN
  Administered 2023-09-30: 2 mg via INTRAVITREAL

## 2023-10-27 NOTE — Progress Notes (Signed)
 [facility] - Clinic Note  10/28/2023   CHIEF COMPLAINT Patient presents for Retina Follow Up  HISTORY OF PRESENT ILLNESS: Justin Pennington is a 63 y.o. male who presents to the clinic today for:  HPI     Retina Follow Up   Patient presents with  Wet AMD.  In left eye.  This started 4 weeks ago.  Duration of weeks.  Since onset it is stable.  I, the attending physician,  performed the HPI with the patient and updated documentation appropriately.        Comments   4 week retina follow up IVE OU pt is reporting no vision changes noticed pt denies any flashes or floaters       Last edited by Ronelle Coffee, MD on 10/28/2023  8:13 AM.    Pt feels like his vision is a little better in normal, every day life, he had no problems with the first Eylea  injection   Referring physician: Sidra Dredge, MD [address] [place],  [place] [address]  HISTORICAL INFORMATION:  Selected notes from the MEDICAL RECORD NUMBER Referred by Dr. Marvin Slot for ex ARMD OS LEE:  Ocular Hx- PMH-   CURRENT MEDICATIONS: No current outpatient medications on file. (Ophthalmic Drugs)   No current facility-administered medications for this visit. (Ophthalmic Drugs)   Current Outpatient Medications (Other)  Medication Sig   cetirizine  (ZYRTEC ) 10 MG tablet Take 1 tablet (10 mg total) by mouth daily.   Evolocumab  (REPATHA  SURECLICK) 140 MG/ML SOAJ Inject 1 mL into the skin every 14 (fourteen) days.   finasteride (PROPECIA) 1 MG tablet Take 1 mg by mouth daily.   levalbuterol  (XOPENEX  HFA) 45 MCG/ACT inhaler Inhale 2 puffs every 6 hours as needed   TRELEGY ELLIPTA  100-62.5-25 MCG/ACT AEPB INHALE 1 PUFF BY MOUTH EVERY DAY   No current facility-administered medications for this visit. (Other)   REVIEW OF SYSTEMS: ROS   Positive for: Eyes Last edited by Alise Appl, COT on 10/28/2023  7:53 AM.      ALLERGIES Allergies  Allergen Reactions   Crestor  [Rosuvastatin]     Joint stiffness   Lipitor [Atorvastatin  Calcium ] Other (See Comments)    Joint stiffness   Zetia [Ezetimibe] Other (See Comments)    "Muscle aches"   PAST MEDICAL HISTORY Past Medical History:  Diagnosis Date   Allergic conjunctivitis    Chronic obstructive asthma, unspecified    History of adenomatous polyp of colon 2005   Less than 1 cm   Hyperlipidemia    IBS (irritable bowel syndrome)    Diarrhea predominant   Insomnia, unspecified    Past Surgical History:  Procedure Laterality Date   COLONOSCOPY     GINGIVAL GRAFT     LEFT MENISCUS TEAR -ARTHROSCOPIC REPAIR     FAMILY HISTORY Family History  Problem Relation Age of Onset   Diabetes Mother        borderline diabetes   Cancer Father        BLADDER   Asthma Brother    Colon cancer Maternal Aunt 60   Stomach cancer Neg Hx    Colon polyps Neg Hx    Esophageal cancer Neg Hx    Rectal cancer Neg Hx    SOCIAL HISTORY Social History   Tobacco Use   Smoking status: Never   Smokeless tobacco: Never  Vaping Use   Vaping status: Never Used  Substance Use Topics   Alcohol use: Yes  Comment: occasional   Drug use: No       OPHTHALMIC EXAM:  Base Eye Exam     Visual Acuity (Snellen - Linear)       Right Left   Dist [place] 20/20 -2 20/100   Dist ph [place]  20/40 -1         Tonometry (Tonopen, 7:58 AM)       Right Left   Pressure 17 16         Pupils       Pupils Dark Light Shape React APD   Right PERRL 3 2 Round Brisk None   Left PERRL 3 2 Round Brisk None         Visual Fields       Left Right    Full Full         Extraocular Movement       Right Left    Full, Ortho Full, Ortho         Neuro/Psych     Oriented x3: Yes   Mood/Affect: Normal         Dilation     Both eyes: 2.5% Phenylephrine @ 7:58 AM           Slit Lamp and Fundus Exam     External Exam       Right Left   External Normal Normal         Slit Lamp Exam       Right Left    Lids/Lashes Dermatochalasis - upper lid Dermatochalasis - upper lid   Conjunctiva/Sclera White and quiet White and quiet   Cornea Arcus, focal corneal haze 0400 Arcus   Anterior Chamber Deep and clear Deep and clear   Iris Round and dilated Round and dilated   Lens 2-3+ Nuclear sclerosis, 2+ Cortical cataract 2-3+ Nuclear sclerosis, 2+ Cortical cataract   Anterior Vitreous Vitreous syneresis Normal         Fundus Exam       Right Left   Disc Pink and sharp Pink and sharp   C/D Ratio 0.2 0.2   Macula Flat, good foveal reflex, Drusen, RPE mottling and clumping, No heme or edema Blunted foveal reflex, central CNV with edema -- slightly improved, punctate heme -- stably resolved, fine drusen   Vessels attenuated, mild tortuosity Vascular attenuation, attenuated, mild tortuosity   Periphery Attached, No heme Attached, No heme           IMAGING AND PROCEDURES  Imaging and Procedures for 10/28/2023  OCT, Retina - OU - Both Eyes       Right Eye Quality was good. Central Foveal Thickness: 309. Progression has been stable. Findings include normal foveal contour, no IRF, no SRF, retinal drusen , pigment epithelial detachment, vitreomacular adhesion .   Left Eye Quality was good. Central Foveal Thickness: 388. Progression has improved. Findings include abnormal foveal contour, subretinal hyper-reflective material, choroidal neovascular membrane, pigment epithelial detachment, vitreomacular adhesion (large central CNV with persistent surrounding IRF/SRF -- improved).   Notes *Images captured and stored on drive  Diagnosis / Impression:  OD: Non Ex. AMD - retinal drusen OS: exudative ARMD - large central CNV with persistent surrounding IRF/SRF -- improved  Clinical management:  See below  Abbreviations: NFP - Normal foveal profile. CME - cystoid macular edema. PED - pigment epithelial detachment. IRF - intraretinal fluid. SRF - subretinal fluid. EZ - ellipsoid zone. ERM - epiretinal  membrane. ORA - outer retinal atrophy. ORT - outer retinal tubulation.  SRHM - subretinal hyper-reflective material. IRHM - intraretinal hyper-reflective material      Intravitreal Injection, Pharmacologic Agent - OS - Left Eye       Time Out 10/28/2023. 8:16 AM. Confirmed correct patient, procedure, site, and patient consented.   Anesthesia Topical anesthesia was used. Anesthetic medications included Lidocaine 2%, Proparacaine 0.5%.   Procedure Preparation included 5% betadine to ocular surface, eyelid speculum. A (32g) needle was used.   Injection: 2 mg aflibercept  2 MG/0.05ML   Route: Intravitreal, Site: Left Eye   NDC: [id number], Lot: [id number], Expiration date: 01/02/2025, Waste: 0 mL   Post-op Post injection exam found visual acuity of at least counting fingers. The patient tolerated the procedure well. There were no complications. The patient received written and verbal post procedure care education.           ASSESSMENT/PLAN:   ICD-10-CM   1. Exudative age-related macular degeneration of left eye with active choroidal neovascularization (HCC)  H35.3221 OCT, Retina - OU - Both Eyes    Intravitreal Injection, Pharmacologic Agent - OS - Left Eye    aflibercept  (EYLEA ) SOLN 2 mg    2. 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, OS  - s/p IVA OS #1 (01.02.25), #2 (01.30.25), #3 (02.27.25) -- IVA resistance  - s/p IVE OS #1 (03.27.25)  - pt reports 1 mo history of blurred vision and distortion, affecting his golfing - BCVA OS 20/40 stable  - OCT OS shows: large central CNV with persistent surrounding IRF/SRF -- slightly improved  - recommend IVE OS #2 today 04.24.25 w/ f/u in 4 wks - pt wishes to be treated with IVE - RBA of procedure discussed, questions answered - IVE informed consent obtained and signed  03.27.25 - see procedure note - Eylea  approved for one eye only  - f/u in 4 wks -- DFE/OCT, possible injection   2. Age related macular degeneration, non-exudative, OD  - The incidence, anatomy, and pathology of dry AMD, risk of progression, and the AREDS and AREDS 2 studies including smoking risks discussed with patient.  - recommend Amsler grid monitoring  3.  Mixed Cataract OU - The symptoms of cataract, surgical options, and treatments and risks were discussed with patient. - discussed diagnosis and progression - under the expert care of [facility] - monitor  Ophthalmic Meds Ordered this visit:  Meds ordered this encounter  Medications   aflibercept  (EYLEA ) SOLN 2 mg     Return in about 4 weeks (around 11/25/2023) for f/u exu ARMD OS, DFE, OCT.  There are no Patient Instructions on file for this visit.  Explained the diagnoses, plan, and follow up with the patient and they expressed understanding.  Patient expressed understanding of the importance of proper follow up care.   This document serves as a record of services personally performed by Jeanice Millard, MD, PhD. It was created on their behalf by Diona Franklin, COMT. The creation of this record is the provider's dictation and/or activities during the visit.  Electronically signed by: Diona Franklin, COMT 10/28/23 8:36 PM  This document serves as a record of services personally performed by Jeanice Millard, MD, PhD. It was created on their behalf by Morley Arabia. Bevin Bucks, OA an ophthalmic technician. The creation of this record is the provider's dictation and/or activities during the visit.  Electronically signed by: Morley Arabia. Bevin Bucks, OA 10/28/23 8:36 PM  Jeanice Millard, M.D., Ph.D. Diseases & Surgery of the Retina and Vitreous [facility]  I have reviewed the above documentation for accuracy and completeness, and I agree with the above. Jeanice Millard, M.D., Ph.D. 10/28/23 8:38 PM    Abbreviations: M myopia (nearsighted); A astigmatism; H hyperopia (farsighted); P presbyopia; Mrx spectacle prescription;  CTL contact lenses; OD right eye; OS left eye; OU both eyes  XT exotropia; ET esotropia; PEK punctate epithelial keratitis; PEE punctate epithelial erosions; DES dry eye syndrome; MGD meibomian gland dysfunction; ATs artificial tears; PFAT's preservative free artificial tears; NSC nuclear sclerotic cataract; PSC posterior subcapsular cataract; ERM epi-retinal membrane; PVD posterior vitreous detachment; RD retinal detachment; DM diabetes mellitus; DR diabetic retinopathy; NPDR non-proliferative diabetic retinopathy; PDR proliferative diabetic retinopathy; CSME clinically significant macular edema; DME diabetic macular edema; dbh dot blot hemorrhages; CWS cotton wool spot; POAG primary open angle glaucoma; C/D cup-to-disc ratio; HVF humphrey visual field; GVF goldmann visual field; OCT optical coherence tomography; IOP intraocular pressure; BRVO Branch retinal vein occlusion; CRVO central retinal vein occlusion; CRAO central retinal artery occlusion; BRAO branch retinal artery occlusion; RT retinal tear; SB scleral buckle; PPV pars plana vitrectomy; VH Vitreous hemorrhage; PRP panretinal laser photocoagulation; IVK intravitreal kenalog; VMT vitreomacular traction; MH Macular hole;  NVD neovascularization of the disc; NVE neovascularization elsewhere; AREDS age related eye disease study; ARMD age related macular degeneration; POAG primary open angle glaucoma; EBMD epithelial/anterior basement membrane dystrophy; ACIOL anterior chamber intraocular lens; IOL intraocular lens; PCIOL posterior chamber intraocular lens; Phaco/IOL phacoemulsification with intraocular lens placement; PRK photorefractive keratectomy; LASIK laser assisted in situ keratomileusis; HTN hypertension; DM diabetes mellitus; COPD chronic obstructive pulmonary disease

## 2023-10-28 ENCOUNTER — Encounter (INDEPENDENT_AMBULATORY_CARE_PROVIDER_SITE_OTHER): Payer: Self-pay | Admitting: Ophthalmology

## 2023-10-28 ENCOUNTER — Ambulatory Visit (INDEPENDENT_AMBULATORY_CARE_PROVIDER_SITE_OTHER): Admitting: Ophthalmology

## 2023-10-28 DIAGNOSIS — H353221 Exudative age-related macular degeneration, left eye, with active choroidal neovascularization: Secondary | ICD-10-CM | POA: Diagnosis not present

## 2023-10-28 DIAGNOSIS — H353111 Nonexudative age-related macular degeneration, right eye, early dry stage: Secondary | ICD-10-CM | POA: Diagnosis not present

## 2023-10-28 DIAGNOSIS — H25013 Cortical age-related cataract, bilateral: Secondary | ICD-10-CM | POA: Diagnosis not present

## 2023-10-28 MED ORDER — AFLIBERCEPT 2MG/0.05ML IZ SOLN FOR KALEIDOSCOPE
2.0000 mg | INTRAVITREAL | Status: AC | PRN
  Administered 2023-10-28: 2 mg via INTRAVITREAL

## 2023-11-23 NOTE — Progress Notes (Signed)
 [facility] - Clinic Note  11/25/2023   CHIEF COMPLAINT Patient presents for Retina Follow Up  HISTORY OF PRESENT ILLNESS: Justin Pennington is a 63 y.o. male who presents to the clinic today for:  HPI     Retina Follow Up   Patient presents with  Wet AMD.  In left eye.  This started 5 months ago.  Duration of 4 weeks.  Since onset it is stable.  I, the attending physician,  performed the HPI with the patient and updated documentation appropriately.        Comments   Pt presents for 4 week retina follow, EXU ARMD/IVE OS. Pt states possible slight improvement in vision for the left eye. Pt denies FOL/floaters/pain. Pt is not using any ATS. Pt states both eyes tend to water a lot both outdoors and indoors.       Last edited by Braedyn Kauk, MD on 11/25/2023  9:21 AM.    Pt states he really doesn't pay attention to his left eye vision anymore   Referring physician: Sidra Dredge, MD [address] [place],  [place] [address]  HISTORICAL INFORMATION:  Selected notes from the MEDICAL RECORD NUMBER Referred by Dr. Marvin Slot for ex ARMD OS LEE:  Ocular Hx- PMH-   CURRENT MEDICATIONS: No current outpatient medications on file. (Ophthalmic Drugs)   No current facility-administered medications for this visit. (Ophthalmic Drugs)   Current Outpatient Medications (Other)  Medication Sig   cetirizine  (ZYRTEC ) 10 MG tablet Take 1 tablet (10 mg total) by mouth daily.   Evolocumab  (REPATHA  SURECLICK) 140 MG/ML SOAJ Inject 1 mL into the skin every 14 (fourteen) days.   finasteride (PROPECIA) 1 MG tablet Take 1 mg by mouth daily.   levalbuterol  (XOPENEX  HFA) 45 MCG/ACT inhaler Inhale 2 puffs every 6 hours as needed   TRELEGY ELLIPTA  100-62.5-25 MCG/ACT AEPB INHALE 1 PUFF BY MOUTH EVERY DAY   No current facility-administered medications for this visit. (Other)   REVIEW OF SYSTEMS: ROS   Positive for: Eyes Last edited by Carrington Clack, COT on 11/25/2023   7:48 AM.       ALLERGIES Allergies  Allergen Reactions   Crestor [Rosuvastatin]     Joint stiffness   Lipitor [Atorvastatin  Calcium ] Other (See Comments)    Joint stiffness   Zetia [Ezetimibe] Other (See Comments)    "Muscle aches"   PAST MEDICAL HISTORY Past Medical History:  Diagnosis Date   Allergic conjunctivitis    Chronic obstructive asthma, unspecified    History of adenomatous polyp of colon 2005   Less than 1 cm   Hyperlipidemia    IBS (irritable bowel syndrome)    Diarrhea predominant   Insomnia, unspecified    Past Surgical History:  Procedure Laterality Date   COLONOSCOPY     GINGIVAL GRAFT     LEFT MENISCUS TEAR -ARTHROSCOPIC REPAIR     FAMILY HISTORY Family History  Problem Relation Age of Onset   Diabetes Mother        borderline diabetes   Cancer Father        BLADDER   Asthma Brother    Colon cancer Maternal Aunt 59   Stomach cancer Neg Hx    Colon polyps Neg Hx    Esophageal cancer Neg Hx    Rectal cancer Neg Hx    SOCIAL HISTORY Social History   Tobacco Use   Smoking status: Never   Smokeless tobacco: Never  Vaping Use  Vaping status: Never Used  Substance Use Topics   Alcohol use: Yes    Comment: occasional   Drug use: No       OPHTHALMIC EXAM:  Base Eye Exam     Visual Acuity (Snellen - Linear)       Right Left   Dist [place] 20/20 -2 20/100 +1   Dist ph  NI 20/30 -2         Tonometry (Tonopen, 7:58 AM)       Right Left   Pressure 19 20         Pupils       Pupils Dark Light Shape React APD   Right PERRL 3 2 Round Brisk None   Left PERRL 3 2 Round Brisk None         Visual Fields       Left Right    Full Full         Extraocular Movement       Right Left    Full, Ortho Full, Ortho         Neuro/Psych     Oriented x3: Yes   Mood/Affect: Normal         Dilation     Both eyes: 1.0% Mydriacyl, 2.5% Phenylephrine @ 8:00 AM           Slit Lamp and Fundus Exam     External Exam        Right Left   External Normal Normal         Slit Lamp Exam       Right Left   Lids/Lashes Dermatochalasis - upper lid Dermatochalasis - upper lid   Conjunctiva/Sclera White and quiet White and quiet   Cornea Arcus, focal corneal haze 0400 Arcus   Anterior Chamber Deep and clear Deep and clear   Iris Round and dilated Round and dilated   Lens 2-3+ Nuclear sclerosis, 2+ Cortical cataract 2-3+ Nuclear sclerosis, 2+ Cortical cataract   Anterior Vitreous Vitreous syneresis Normal         Fundus Exam       Right Left   Disc Pink and sharp Pink and sharp   C/D Ratio 0.2 0.2   Macula Flat, good foveal reflex, Drusen, RPE mottling and clumping, No heme or edema Blunted foveal reflex, central CNV with edema -- slightly improved, punctate heme -- stably resolved, fine drusen   Vessels attenuated, mild tortuosity attenuated, mild tortuosity   Periphery Attached, No heme Attached, No heme           IMAGING AND PROCEDURES  Imaging and Procedures for 11/25/2023  OCT, Retina - OU - Both Eyes        Right Eye Quality was good. Central Foveal Thickness: 309. Progression has been stable. Findings include normal foveal contour, no IRF, no SRF, retinal drusen , pigment epithelial detachment, vitreomacular adhesion .   Left Eye Quality was good. Central Foveal Thickness: 348. Progression has improved. Findings include abnormal foveal contour, subretinal hyper-reflective material, choroidal neovascular membrane, pigment epithelial detachment, vitreomacular adhesion (large central CNV with persistent surrounding IRF/SRF -- improved).   Notes  *Images captured and stored on drive  Diagnosis / Impression:  OD: Non Ex. AMD - retinal drusen OS: exudative ARMD - large central CNV with persistent surrounding IRF/SRF -- improved  Clinical management:  See below  Abbreviations: NFP - Normal foveal profile. CME - cystoid macular edema. PED - pigment epithelial detachment. IRF -  intraretinal fluid. SRF - subretinal  fluid. EZ - ellipsoid zone. ERM - epiretinal membrane. ORA - outer retinal atrophy. ORT - outer retinal tubulation. SRHM - subretinal hyper-reflective material. IRHM - intraretinal hyper-reflective material      Intravitreal Injection, Pharmacologic Agent - OS - Left Eye       Time Out 11/25/2023. 8:10 AM. Confirmed correct patient, procedure, site, and patient consented.   Anesthesia Topical anesthesia was used. Anesthetic medications included Lidocaine 2%, Proparacaine 0.5%.   Procedure Preparation included 5% betadine to ocular surface, eyelid speculum. A (32g) needle was used.   Injection: 2 mg aflibercept  2 MG/0.05ML   Route: Intravitreal, Site: Left Eye   NDC: [id number], Lot: [id number], Expiration date: 02/02/2025, Waste: 0 mL   Post-op Post injection exam found visual acuity of at least counting fingers. The patient tolerated the procedure well. There were no complications. The patient received written and verbal post procedure care education.            ASSESSMENT/PLAN:   ICD-10-CM   1. Exudative age-related macular degeneration of left eye with active choroidal neovascularization (HCC)  H35.3221 OCT, Retina - OU - Both Eyes    Intravitreal Injection, Pharmacologic Agent - OS - Left Eye    aflibercept  (EYLEA ) SOLN 2 mg    2. 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 Cortical age-related cataract of both eyes  H25.013       1. 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, OS  - s/p IVA OS #1 (01.02.25), #2 (01.30.25), #3 (02.27.25) -- IVA resistance  - s/p IVE OS #1 (03.27.25), #2 (04.24.25)  - pt reports 1 mo history of blurred vision and distortion, affecting his golfing - BCVA OS improved to 20/30 from 20/40   - OCT OS shows: large central CNV with persistent surrounding IRF/SRF -- slightly improved  **discussed decreased efficacy / resistance to Eylea  and potential benefit  of switching from Eylea  to Vabysmo*  - recommend IVE OS #3 today 05.22.25 w/ f/u in 4 wks - pt wishes to be treated with IVE - RBA of procedure discussed, questions answered - IVE informed consent obtained and signed 03.27.25 - see procedure note - Eylea  approved for one eye only - will check Vabysmo auth for next visit  - f/u in 4 wks -- DFE/OCT, possible injection   2. Age related macular degeneration, non-exudative, OD  - The incidence, anatomy, and pathology of dry AMD, risk of progression, and the AREDS and AREDS 2 studies including smoking risks discussed with patient.  - recommend Amsler grid monitoring  3.  Mixed Cataract OU - The symptoms of cataract, surgical options, and treatments and risks were discussed with patient. - discussed diagnosis and progression - under the expert care of [facility] - monitor  Ophthalmic Meds Ordered this visit:  Meds ordered this encounter  Medications   aflibercept  (EYLEA ) SOLN 2 mg     Return in about 4 weeks (around 12/23/2023) for f/u exu ARMD OS, DFE, OCT, Possible Injxn.  There are no Patient Instructions on file for this visit.  Explained the diagnoses, plan, and follow up with the patient and they expressed understanding.  Patient expressed understanding of the importance of proper follow up care.   This document serves as a record of services personally performed by Jeanice Millard, MD, PhD. It was created on their behalf by Diona Franklin, COMT. The creation of this record is the provider's dictation and/or activities during the visit.  Electronically  signed by: Diona Franklin, COMT 11/25/23 9:22 AM  This document serves as a record of services personally performed by Jeanice Millard, MD, PhD. It was created on their behalf by Morley Arabia. Bevin Bucks, OA an ophthalmic technician. The creation of this record is the provider's dictation and/or activities during the visit.    Electronically signed by: Morley Arabia. Bevin Bucks, OA 11/25/23 9:22  AM  Jeanice Millard, M.D., Ph.D. Diseases & Surgery of the Retina and Vitreous [facility]  I have reviewed the above documentation for accuracy and completeness, and I agree with the above. Jeanice Millard, M.D., Ph.D. 11/25/23 9:23 AM   Abbreviations: M myopia (nearsighted); A astigmatism; H hyperopia (farsighted); P presbyopia; Mrx spectacle prescription;  CTL contact lenses; OD right eye; OS left eye; OU both eyes  XT exotropia; ET esotropia; PEK punctate epithelial keratitis; PEE punctate epithelial erosions; DES dry eye syndrome; MGD meibomian gland dysfunction; ATs artificial tears; PFAT's preservative free artificial tears; NSC nuclear sclerotic cataract; PSC posterior subcapsular cataract; ERM epi-retinal membrane; PVD posterior vitreous detachment; RD retinal detachment; DM diabetes mellitus; DR diabetic retinopathy; NPDR non-proliferative diabetic retinopathy; PDR proliferative diabetic retinopathy; CSME clinically significant macular edema; DME diabetic macular edema; dbh dot blot hemorrhages; CWS cotton wool spot; POAG primary open angle glaucoma; C/D cup-to-disc ratio; HVF humphrey visual field; GVF goldmann visual field; OCT optical coherence tomography; IOP intraocular pressure; BRVO Branch retinal vein occlusion; CRVO central retinal vein occlusion; CRAO central retinal artery occlusion; BRAO branch retinal artery occlusion; RT retinal tear; SB scleral buckle; PPV pars plana vitrectomy; VH Vitreous hemorrhage; PRP panretinal laser photocoagulation; IVK intravitreal kenalog; VMT vitreomacular traction; MH Macular hole;  NVD neovascularization of the disc; NVE neovascularization elsewhere; AREDS age related eye disease study; ARMD age related macular degeneration; POAG primary open angle glaucoma; EBMD epithelial/anterior basement membrane dystrophy; ACIOL anterior chamber intraocular lens; IOL intraocular lens; PCIOL posterior chamber intraocular lens; Phaco/IOL  phacoemulsification with intraocular lens placement; PRK photorefractive keratectomy; LASIK laser assisted in situ keratomileusis; HTN hypertension; DM diabetes mellitus; COPD chronic obstructive pulmonary disease

## 2023-11-25 ENCOUNTER — Encounter (INDEPENDENT_AMBULATORY_CARE_PROVIDER_SITE_OTHER): Payer: Self-pay | Admitting: Ophthalmology

## 2023-11-25 ENCOUNTER — Ambulatory Visit (INDEPENDENT_AMBULATORY_CARE_PROVIDER_SITE_OTHER): Admitting: Ophthalmology

## 2023-11-25 DIAGNOSIS — H25013 Cortical age-related cataract, bilateral: Secondary | ICD-10-CM

## 2023-11-25 DIAGNOSIS — H353111 Nonexudative age-related macular degeneration, right eye, early dry stage: Secondary | ICD-10-CM | POA: Diagnosis not present

## 2023-11-25 DIAGNOSIS — H353221 Exudative age-related macular degeneration, left eye, with active choroidal neovascularization: Secondary | ICD-10-CM

## 2023-11-25 MED ORDER — AFLIBERCEPT 2MG/0.05ML IZ SOLN FOR KALEIDOSCOPE
2.0000 mg | INTRAVITREAL | Status: AC | PRN
  Administered 2023-11-25: 2 mg via INTRAVITREAL

## 2023-12-10 NOTE — Progress Notes (Shared)
 [facility] - Clinic Note  12/23/2023   CHIEF COMPLAINT Patient presents for No chief complaint on file.  HISTORY OF PRESENT ILLNESS: Justin Pennington is a 63 y.o. male who presents to the clinic today for:   Pt states he really doesn't pay attention to his left eye vision anymore   Referring physician: Sidra Dredge, MD [address] [place],  [place] [address]  HISTORICAL INFORMATION:  Selected notes from the MEDICAL RECORD NUMBER Referred by Dr. Marvin Slot for ex ARMD OS LEE:  Ocular Hx- PMH-   CURRENT MEDICATIONS: No current outpatient medications on file. (Ophthalmic Drugs)   No current facility-administered medications for this visit. (Ophthalmic Drugs)   Current Outpatient Medications (Other)  Medication Sig   cetirizine  (ZYRTEC ) 10 MG tablet Take 1 tablet (10 mg total) by mouth daily.   Evolocumab  (REPATHA  SURECLICK) 140 MG/ML SOAJ Inject 1 mL into the skin every 14 (fourteen) days.   finasteride (PROPECIA) 1 MG tablet Take 1 mg by mouth daily.   levalbuterol  (XOPENEX  HFA) 45 MCG/ACT inhaler Inhale 2 puffs every 6 hours as needed   TRELEGY ELLIPTA  100-62.5-25 MCG/ACT AEPB INHALE 1 PUFF BY MOUTH EVERY DAY   No current facility-administered medications for this visit. (Other)   REVIEW OF SYSTEMS:     ALLERGIES Allergies  Allergen Reactions   Crestor [Rosuvastatin]     Joint stiffness   Lipitor [Atorvastatin  Calcium ] Other (See Comments)    Joint stiffness   Zetia [Ezetimibe] Other (See Comments)    "Muscle aches"   PAST MEDICAL HISTORY Past Medical History:  Diagnosis Date   Allergic conjunctivitis    Chronic obstructive asthma, unspecified    History of adenomatous polyp of colon 2005   Less than 1 cm   Hyperlipidemia    IBS (irritable bowel syndrome)    Diarrhea predominant   Insomnia, unspecified    Past Surgical History:  Procedure Laterality Date   COLONOSCOPY     GINGIVAL GRAFT     LEFT MENISCUS TEAR  -ARTHROSCOPIC REPAIR     FAMILY HISTORY Family History  Problem Relation Age of Onset   Diabetes Mother        borderline diabetes   Cancer Father        BLADDER   Asthma Brother    Colon cancer Maternal Aunt 64   Stomach cancer Neg Hx    Colon polyps Neg Hx    Esophageal cancer Neg Hx    Rectal cancer Neg Hx    SOCIAL HISTORY Social History   Tobacco Use   Smoking status: Never   Smokeless tobacco: Never  Vaping Use   Vaping status: Never Used  Substance Use Topics   Alcohol use: Yes    Comment: occasional   Drug use: No       OPHTHALMIC EXAM:  Not recorded    IMAGING AND PROCEDURES  Imaging and Procedures for 12/23/2023          ASSESSMENT/PLAN:   ICD-10-CM   1. Exudative age-related macular degeneration of left eye with active choroidal neovascularization (HCC)  H35.3221     2. 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 Cortical age-related cataract of both eyes  H25.013       1. 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, OS  - s/p IVA OS #1 (01.02.25), #2 (01.30.25), #3 (02.27.25) -- IVA resistance  - s/p IVE OS #1 (03.27.25), #2 (  04.24.25), #3 (05.22.25)  - pt reports 1 mo history of blurred vision and distortion, affecting his golfing - BCVA OS improved to 20/30 from 20/40   - OCT OS shows: large central CNV with persistent surrounding IRF/SRF -- slightly improved  **discussed decreased efficacy / resistance to Eylea  and potential benefit of switching from Eylea  to Vabysmo*  - recommend IVE OS #4 today 06.19.25 w/ f/u in 4 wks - pt wishes to be treated with IVE - RBA of procedure discussed, questions answered - IVE informed consent obtained and signed 03.27.25 - see procedure note - Eylea  approved for one eye only - will check Vabysmo auth for next visit  - f/u in 4 wks -- DFE/OCT, possible injection   2. Age related macular degeneration, non-exudative, OD  - The incidence, anatomy, and  pathology of dry AMD, risk of progression, and the AREDS and AREDS 2 studies including smoking risks discussed with patient.  - recommend Amsler grid monitoring  3.  Mixed Cataract OU - The symptoms of cataract, surgical options, and treatments and risks were discussed with patient. - discussed diagnosis and progression - under the expert care of [facility] - monitor  Ophthalmic Meds Ordered this visit:  No orders of the defined types were placed in this encounter.    No follow-ups on file.  There are no Patient Instructions on file for this visit.  Explained the diagnoses, plan, and follow up with the patient and they expressed understanding.  Patient expressed understanding of the importance of proper follow up care.   This document serves as a record of services personally performed by Jeanice Millard, MD, PhD. It was created on their behalf by Morley Arabia. Bevin Bucks, OA an ophthalmic technician. The creation of this record is the provider's dictation and/or activities during the visit.    Electronically signed by: Morley Arabia. Bevin Bucks, OA 12/10/23 12:16 PM   Jeanice Millard, M.D., Ph.D. Diseases & Surgery of the Retina and Vitreous [facility]    Abbreviations: M myopia (nearsighted); A astigmatism; H hyperopia (farsighted); P presbyopia; Mrx spectacle prescription;  CTL contact lenses; OD right eye; OS left eye; OU both eyes  XT exotropia; ET esotropia; PEK punctate epithelial keratitis; PEE punctate epithelial erosions; DES dry eye syndrome; MGD meibomian gland dysfunction; ATs artificial tears; PFAT's preservative free artificial tears; NSC nuclear sclerotic cataract; PSC posterior subcapsular cataract; ERM epi-retinal membrane; PVD posterior vitreous detachment; RD retinal detachment; DM diabetes mellitus; DR diabetic retinopathy; NPDR non-proliferative diabetic retinopathy; PDR proliferative diabetic retinopathy; CSME clinically significant macular edema; DME  diabetic macular edema; dbh dot blot hemorrhages; CWS cotton wool spot; POAG primary open angle glaucoma; C/D cup-to-disc ratio; HVF humphrey visual field; GVF goldmann visual field; OCT optical coherence tomography; IOP intraocular pressure; BRVO Branch retinal vein occlusion; CRVO central retinal vein occlusion; CRAO central retinal artery occlusion; BRAO branch retinal artery occlusion; RT retinal tear; SB scleral buckle; PPV pars plana vitrectomy; VH Vitreous hemorrhage; PRP panretinal laser photocoagulation; IVK intravitreal kenalog; VMT vitreomacular traction; MH Macular hole;  NVD neovascularization of the disc; NVE neovascularization elsewhere; AREDS age related eye disease study; ARMD age related macular degeneration; POAG primary open angle glaucoma; EBMD epithelial/anterior basement membrane dystrophy; ACIOL anterior chamber intraocular lens; IOL intraocular lens; PCIOL posterior chamber intraocular lens; Phaco/IOL phacoemulsification with intraocular lens placement; PRK photorefractive keratectomy; LASIK laser assisted in situ keratomileusis; HTN hypertension; DM diabetes mellitus; COPD chronic obstructive pulmonary disease

## 2023-12-23 ENCOUNTER — Encounter (INDEPENDENT_AMBULATORY_CARE_PROVIDER_SITE_OTHER): Admitting: Ophthalmology

## 2023-12-23 DIAGNOSIS — H353221 Exudative age-related macular degeneration, left eye, with active choroidal neovascularization: Secondary | ICD-10-CM

## 2023-12-23 DIAGNOSIS — H353111 Nonexudative age-related macular degeneration, right eye, early dry stage: Secondary | ICD-10-CM

## 2023-12-23 DIAGNOSIS — H25013 Cortical age-related cataract, bilateral: Secondary | ICD-10-CM

## 2023-12-23 NOTE — Progress Notes (Shared)
 [facility] - Clinic Note  12/27/2023   CHIEF COMPLAINT Patient presents for No chief complaint on file.  HISTORY OF PRESENT ILLNESS: Justin Pennington is a 63 y.o. male who presents to the clinic today for:   Pt states he really doesn't pay attention to his left eye vision anymore   Referring physician: Sidra Dredge, MD [address] [place],  [place] [address]  HISTORICAL INFORMATION:  Selected notes from the MEDICAL RECORD NUMBER Referred by Dr. Marvin Slot for ex ARMD OS LEE:  Ocular Hx- PMH-   CURRENT MEDICATIONS: No current outpatient medications on file. (Ophthalmic Drugs)   No current facility-administered medications for this visit. (Ophthalmic Drugs)   Current Outpatient Medications (Other)  Medication Sig   cetirizine  (ZYRTEC ) 10 MG tablet Take 1 tablet (10 mg total) by mouth daily.   Evolocumab  (REPATHA  SURECLICK) 140 MG/ML SOAJ Inject 1 mL into the skin every 14 (fourteen) days.   finasteride (PROPECIA) 1 MG tablet Take 1 mg by mouth daily.   levalbuterol  (XOPENEX  HFA) 45 MCG/ACT inhaler Inhale 2 puffs every 6 hours as needed   TRELEGY ELLIPTA  100-62.5-25 MCG/ACT AEPB INHALE 1 PUFF BY MOUTH EVERY DAY   No current facility-administered medications for this visit. (Other)   REVIEW OF SYSTEMS:     ALLERGIES Allergies  Allergen Reactions   Crestor [Rosuvastatin]     Joint stiffness   Lipitor [Atorvastatin  Calcium ] Other (See Comments)    Joint stiffness   Zetia [Ezetimibe] Other (See Comments)    Muscle aches   PAST MEDICAL HISTORY Past Medical History:  Diagnosis Date   Allergic conjunctivitis    Chronic obstructive asthma, unspecified    History of adenomatous polyp of colon 2005   Less than 1 cm   Hyperlipidemia    IBS (irritable bowel syndrome)    Diarrhea predominant   Insomnia, unspecified    Past Surgical History:  Procedure Laterality Date   COLONOSCOPY     GINGIVAL GRAFT     LEFT MENISCUS TEAR  -ARTHROSCOPIC REPAIR     FAMILY HISTORY Family History  Problem Relation Age of Onset   Diabetes Mother        borderline diabetes   Cancer Father        BLADDER   Asthma Brother    Colon cancer Maternal Aunt 13   Stomach cancer Neg Hx    Colon polyps Neg Hx    Esophageal cancer Neg Hx    Rectal cancer Neg Hx    SOCIAL HISTORY Social History   Tobacco Use   Smoking status: Never   Smokeless tobacco: Never  Vaping Use   Vaping status: Never Used  Substance Use Topics   Alcohol use: Yes    Comment: occasional   Drug use: No       OPHTHALMIC EXAM:  Not recorded    IMAGING AND PROCEDURES  Imaging and Procedures for 12/27/2023          ASSESSMENT/PLAN: No diagnosis found.   1. 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, OS  - s/p IVA OS #1 (01.02.25), #2 (01.30.25), #3 (02.27.25) -- IVA resistance  - s/p IVE OS #1 (03.27.25), #2 (04.24.25), #3 (05.22.25)  - pt reports 1 mo history of blurred vision and distortion, affecting his golfing - BCVA OS improved to 20/30 from 20/40   - OCT OS shows: large central CNV with persistent surrounding IRF/SRF -- slightly improved  **discussed decreased efficacy / resistance to Eylea  and  potential benefit of switching from Eylea  to Vabysmo*  - recommend IVE OS #4 today 06.23.25 w/ f/u in 4 wks - pt wishes to be treated with IVE - RBA of procedure discussed, questions answered - IVE informed consent obtained and signed 03.27.25 - see procedure note - Eylea  approved for one eye only - will check Vabysmo auth for next visit  - f/u in 4 wks -- DFE/OCT, possible injection   2. Age related macular degeneration, non-exudative, OD  - The incidence, anatomy, and pathology of dry AMD, risk of progression, and the AREDS and AREDS 2 studies including smoking risks discussed with patient.  - recommend Amsler grid monitoring  3.  Mixed Cataract OU - The symptoms of cataract, surgical options, and treatments and risks were discussed  with patient. - discussed diagnosis and progression - under the expert care of [facility] - monitor  Ophthalmic Meds Ordered this visit:  No orders of the defined types were placed in this encounter.    No follow-ups on file.  There are no Patient Instructions on file for this visit.  Explained the diagnoses, plan, and follow up with the patient and they expressed understanding.  Patient expressed understanding of the importance of proper follow up care.   This document serves as a record of services personally performed by Jeanice Millard, MD, PhD. It was created on their behalf by Mayola Specking, COA an ophthalmic technician. The creation of this record is the provider's dictation and/or activities during the visit.   Electronically signed by: Carrington Clack, COT  12/23/23  10:27 AM   This document serves as a record of services personally performed by Jeanice Millard, MD, PhD. It was created on their behalf by Morley Arabia. Bevin Bucks, OA an ophthalmic technician. The creation of this record is the provider's dictation and/or activities during the visit.    Electronically signed by: Morley Arabia. Bevin Bucks, OA 12/23/23 10:26 AM   Jeanice Millard, M.D., Ph.D. Diseases & Surgery of the Retina and Vitreous [facility]    Abbreviations: M myopia (nearsighted); A astigmatism; H hyperopia (farsighted); P presbyopia; Mrx spectacle prescription;  CTL contact lenses; OD right eye; OS left eye; OU both eyes  XT exotropia; ET esotropia; PEK punctate epithelial keratitis; PEE punctate epithelial erosions; DES dry eye syndrome; MGD meibomian gland dysfunction; ATs artificial tears; PFAT's preservative free artificial tears; NSC nuclear sclerotic cataract; PSC posterior subcapsular cataract; ERM epi-retinal membrane; PVD posterior vitreous detachment; RD retinal detachment; DM diabetes mellitus; DR diabetic retinopathy; NPDR non-proliferative diabetic retinopathy; PDR proliferative  diabetic retinopathy; CSME clinically significant macular edema; DME diabetic macular edema; dbh dot blot hemorrhages; CWS cotton wool spot; POAG primary open angle glaucoma; C/D cup-to-disc ratio; HVF humphrey visual field; GVF goldmann visual field; OCT optical coherence tomography; IOP intraocular pressure; BRVO Branch retinal vein occlusion; CRVO central retinal vein occlusion; CRAO central retinal artery occlusion; BRAO branch retinal artery occlusion; RT retinal tear; SB scleral buckle; PPV pars plana vitrectomy; VH Vitreous hemorrhage; PRP panretinal laser photocoagulation; IVK intravitreal kenalog; VMT vitreomacular traction; MH Macular hole;  NVD neovascularization of the disc; NVE neovascularization elsewhere; AREDS age related eye disease study; ARMD age related macular degeneration; POAG primary open angle glaucoma; EBMD epithelial/anterior basement membrane dystrophy; ACIOL anterior chamber intraocular lens; IOL intraocular lens; PCIOL posterior chamber intraocular lens; Phaco/IOL phacoemulsification with intraocular lens placement; PRK photorefractive keratectomy; LASIK laser assisted in situ keratomileusis; HTN hypertension; DM diabetes mellitus; COPD chronic obstructive pulmonary disease

## 2023-12-27 ENCOUNTER — Encounter (INDEPENDENT_AMBULATORY_CARE_PROVIDER_SITE_OTHER): Payer: Self-pay | Admitting: Ophthalmology

## 2023-12-27 ENCOUNTER — Ambulatory Visit (INDEPENDENT_AMBULATORY_CARE_PROVIDER_SITE_OTHER): Admitting: Ophthalmology

## 2023-12-27 DIAGNOSIS — H25013 Cortical age-related cataract, bilateral: Secondary | ICD-10-CM | POA: Diagnosis not present

## 2023-12-27 DIAGNOSIS — H353221 Exudative age-related macular degeneration, left eye, with active choroidal neovascularization: Secondary | ICD-10-CM | POA: Diagnosis not present

## 2023-12-27 DIAGNOSIS — H353111 Nonexudative age-related macular degeneration, right eye, early dry stage: Secondary | ICD-10-CM | POA: Diagnosis not present

## 2023-12-27 MED ORDER — FARICIMAB-SVOA 6 MG/0.05ML IZ SOSY
6.0000 mg | PREFILLED_SYRINGE | INTRAVITREAL | Status: AC | PRN
Start: 2023-12-27 — End: 2023-12-27
  Administered 2023-12-27: 6 mg via INTRAVITREAL

## 2024-01-19 NOTE — Progress Notes (Signed)
 [facility] - Clinic Note  01/27/2024   CHIEF COMPLAINT Patient presents for Retina Follow Up  HISTORY OF PRESENT ILLNESS: Justin Pennington is a 63 y.o. male who presents to the clinic today for:  HPI     Retina Follow Up   Patient presents with  Wet AMD.  In left eye.  This started 4 weeks ago.  I, the attending physician,  performed the HPI with the patient and updated documentation appropriately.        Comments   Patient here for 4 weeks retina follow up for exu ARMD OS. Patient states vision can't tell any difference. No eye pain.      Last edited by Justin Rogue, MD on 01/27/2024  8:33 AM.      Referring physician: [name], MD [address] [employer] [place],  [place] [address]  HISTORICAL INFORMATION:  Selected notes from the MEDICAL RECORD NUMBER Referred by Dr. GORMAN Justin for ex ARMD OS LEE:  Ocular Hx- PMH-   CURRENT MEDICATIONS: No current outpatient medications on file. (Ophthalmic Drugs)   No current facility-administered medications for this visit. (Ophthalmic Drugs)   Current Outpatient Medications (Other)  Medication Sig   cetirizine  (ZYRTEC ) 10 MG tablet Take 1 tablet (10 mg total) by mouth daily.   Evolocumab  (REPATHA  SURECLICK) 140 MG/ML SOAJ Inject 1 mL into the skin every 14 (fourteen) days.   finasteride (PROPECIA) 1 MG tablet Take 1 mg by mouth daily.   levalbuterol  (XOPENEX  HFA) 45 MCG/ACT inhaler Inhale 2 puffs every 6 hours as needed   TRELEGY ELLIPTA  100-62.5-25 MCG/ACT AEPB INHALE 1 PUFF BY MOUTH EVERY DAY   No current facility-administered medications for this visit. (Other)   REVIEW OF SYSTEMS: ROS   Positive for: Eyes Last edited by Orval Asberry GORMAN, COA on 01/27/2024  7:55 AM.      ALLERGIES Allergies  Allergen Reactions   Crestor [Rosuvastatin]     Joint stiffness   Lipitor [Atorvastatin  Calcium ] Other (See Comments)    Joint stiffness   Zetia [Ezetimibe] Other (See Comments)    Muscle aches    PAST MEDICAL HISTORY Past Medical History:  Diagnosis Date   Allergic conjunctivitis    Chronic obstructive asthma, unspecified    History of adenomatous polyp of colon 2005   Less than 1 cm   Hyperlipidemia    IBS (irritable bowel syndrome)    Diarrhea predominant   Insomnia, unspecified    Past Surgical History:  Procedure Laterality Date   COLONOSCOPY     GINGIVAL GRAFT     LEFT MENISCUS TEAR -ARTHROSCOPIC REPAIR     FAMILY HISTORY Family History  Problem Relation Age of Onset   Diabetes Mother        borderline diabetes   Cancer Father        BLADDER   Asthma Brother    Colon cancer Maternal Aunt 60   Stomach cancer Neg Hx    Colon polyps Neg Hx    Esophageal cancer Neg Hx    Rectal cancer Neg Hx    SOCIAL HISTORY Social History   Tobacco Use   Smoking status: Never   Smokeless tobacco: Never  Vaping Use   Vaping status: Never Used  Substance Use Topics   Alcohol use: Yes    Comment: occasional   Drug use: No       OPHTHALMIC EXAM:  Base Eye Exam     Visual Acuity (Snellen - Linear)  Right Left   Dist [place] 20/25 +1 20/100 +1   Dist ph [place] 20/20 20/40 -2         Tonometry (Tonopen, 7:53 AM)       Right Left   Pressure 19 18         Pupils       Dark Light Shape React APD   Right 3 2 Round Brisk None   Left 3 2 Round Brisk None         Visual Fields (Counting fingers)       Left Right    Full Full         Extraocular Movement       Right Left    Full, Ortho Full, Ortho         Neuro/Psych     Oriented x3: Yes   Mood/Affect: Normal         Dilation     Both eyes: 1.0% Mydriacyl, 2.5% Phenylephrine @ 7:53 AM           Slit Lamp and Fundus Exam     External Exam       Right Left   External Normal Normal         Slit Lamp Exam       Right Left   Lids/Lashes Dermatochalasis - upper lid Dermatochalasis - upper lid   Conjunctiva/Sclera White and quiet White and quiet   Cornea Arcus, focal  corneal haze 0400 Arcus   Anterior Chamber Deep and clear Deep and clear   Iris Round and dilated Round and dilated   Lens 2-3+ Nuclear sclerosis, 2+ Cortical cataract 2-3+ Nuclear sclerosis, 2+ Cortical cataract   Anterior Vitreous Vitreous syneresis Normal         Fundus Exam       Right Left   Disc Pink and sharp Pink and sharp   C/D Ratio 0.2 0.2   Macula Flat, good foveal reflex, Drusen, RPE mottling and clumping, No heme or edema Blunted foveal reflex, central CNV with edema -- slightly improved, no heme, fine drusen   Vessels attenuated, mild tortuosity attenuated, mild tortuosity   Periphery Attached, No heme Attached, No heme           IMAGING AND PROCEDURES  Imaging and Procedures for 01/27/2024  OCT, Retina - OU - Both Eyes       Right Eye Quality was good. Central Foveal Thickness: 308. Progression has been stable. Findings include normal foveal contour, no IRF, no SRF, retinal drusen , pigment epithelial detachment, vitreomacular adhesion .   Left Eye Quality was good. Central Foveal Thickness: 337. Progression has improved. Findings include abnormal foveal contour, subretinal hyper-reflective material, choroidal neovascular membrane, pigment epithelial detachment, vitreomacular adhesion (stable central CNV/low PED w/ stable improvement in IRF, interval improvement in SRF surrounding).   Notes *Images captured and stored on drive  Diagnosis / Impression:  OD: Non Ex. AMD - retinal drusen OS: ex ARMD - stable central CNV/low PED w/ stable improvement in IRF, interval improvement in SRF surrounding  Clinical management:  See below  Abbreviations: NFP - Normal foveal profile. CME - cystoid macular edema. PED - pigment epithelial detachment. IRF - intraretinal fluid. SRF - subretinal fluid. EZ - ellipsoid zone. ERM - epiretinal membrane. ORA - outer retinal atrophy. ORT - outer retinal tubulation. SRHM - subretinal hyper-reflective material. IRHM - intraretinal  hyper-reflective material      Intravitreal Injection, Pharmacologic Agent - OS - Left Eye  Time Out 01/27/2024. 8:14 AM. Confirmed correct patient, procedure, site, and patient consented.   Anesthesia Topical anesthesia was used. Anesthetic medications included Lidocaine 2%, Proparacaine 0.5%.   Procedure Preparation included 5% betadine to ocular surface, eyelid speculum. A supplied (32g) needle was used.   Injection: 6 mg faricimab -svoa 6 MG/0.05ML   Route: Intravitreal, Site: Left Eye   NDC: 49757-903-93, Lot: [id number], Expiration date: 11/02/2024, Waste: 0 mL   Post-op Post injection exam found visual acuity of at least counting fingers. The patient tolerated the procedure well. There were no complications. The patient received written and verbal post procedure care education.            ASSESSMENT/PLAN:   ICD-10-CM   1. Exudative age-related macular degeneration of left eye with active choroidal neovascularization (HCC)  H35.3221 OCT, Retina - OU - Both Eyes    Intravitreal Injection, Pharmacologic Agent - OS - Left Eye    faricimab -svoa (VABYSMO ) 6mg /0.36mL intravitreal injection    2. 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   - recommend IVV OS #2 today 07.24.25 w/ f/u in 4 wks - pt wishes to be treated with IVV - RBA of procedure discussed, questions answered - IVV informed consent obtained and signed 06.23.25 - see procedure  note - Eylea  and Vabysmo  approved for one eye only -- insurance paying 100%  - f/u in 4 wks -- DFE/OCT, possible injection   2. Age related macular degeneration, non-exudative, OD  - The incidence, anatomy, and pathology of dry AMD, risk of progression, and the AREDS and AREDS 2 studies including smoking risks discussed with patient.  - recommend Amsler grid monitoring  3.  Mixed Cataract OU - The symptoms of cataract, surgical options, and treatments and risks were discussed with patient. - discussed diagnosis and progression - under the expert care of [facility] - monitor  Ophthalmic Meds Ordered this visit:  Meds ordered this encounter  Medications   faricimab -svoa (VABYSMO ) 6mg /0.41mL intravitreal injection     Return in about 4 weeks (around 02/24/2024) for exu ARMD OS, DFE, OCT, Possible Injxn.  There are no Patient Instructions on file for this visit.  Explained the diagnoses, plan, and follow up with the patient and they expressed understanding.  Patient expressed understanding of the importance of proper follow up care.   This document serves as a record of services personally performed by Redell JUDITHANN Hans, MD, PhD. It was created on their behalf by Alan PARAS. Delores, OA an ophthalmic technician. The creation of this record is the provider's dictation and/or activities during the visit.    Electronically signed by: Alan PARAS. Delores, OA 01/27/24 12:52 PM  This document serves as a record of services personally performed by Redell JUDITHANN Hans, MD, PhD. It was created on their behalf by Almetta Pesa, an ophthalmic technician. The creation of this record is the provider's dictation and/or activities during the visit.  Electronically signed by: Almetta Pesa, OA, 01/27/24  12:52 PM  Redell JUDITHANN Hans, M.D., Ph.D. Diseases & Surgery of the Retina and Vitreous [facility]  I have reviewed the above documentation for accuracy and completeness, and I  agree with the above. Redell JUDITHANN Hans, M.D., Ph.D. 01/27/24 12:53 PM   Abbreviations: M myopia (nearsighted); A astigmatism; H hyperopia (farsighted); P presbyopia; Mrx spectacle prescription;  CTL contact lenses; OD right eye; OS left eye; OU both eyes  XT exotropia; ET esotropia; PEK punctate epithelial keratitis; PEE punctate epithelial erosions; DES dry eye syndrome; MGD meibomian gland dysfunction; ATs artificial tears; PFAT's preservative free artificial tears; NSC nuclear sclerotic cataract; PSC posterior subcapsular cataract; ERM epi-retinal membrane; PVD posterior vitreous detachment; RD retinal detachment; DM diabetes mellitus; DR diabetic retinopathy; NPDR non-proliferative diabetic retinopathy; PDR proliferative diabetic retinopathy; CSME clinically significant macular edema; DME diabetic macular edema; dbh dot blot hemorrhages; CWS cotton wool spot; POAG primary open angle glaucoma; C/D cup-to-disc ratio; HVF humphrey visual field; GVF goldmann visual field; OCT optical coherence tomography; IOP intraocular pressure; BRVO Branch retinal vein occlusion; CRVO central retinal vein occlusion; CRAO central retinal artery occlusion; BRAO branch retinal artery occlusion; RT retinal tear; SB scleral buckle; PPV pars plana vitrectomy; VH Vitreous hemorrhage; PRP panretinal laser photocoagulation; IVK intravitreal kenalog; VMT vitreomacular traction; MH Macular hole;  NVD neovascularization of the disc; NVE neovascularization elsewhere; AREDS age related eye disease study; ARMD age related macular degeneration; POAG primary open angle glaucoma; EBMD epithelial/anterior basement membrane dystrophy; ACIOL anterior chamber intraocular lens; IOL intraocular lens; PCIOL posterior chamber intraocular lens; Phaco/IOL phacoemulsification with intraocular lens placement; PRK photorefractive keratectomy; LASIK laser assisted in situ keratomileusis; HTN hypertension; DM diabetes mellitus; COPD chronic obstructive  pulmonary disease

## 2024-01-24 ENCOUNTER — Encounter (INDEPENDENT_AMBULATORY_CARE_PROVIDER_SITE_OTHER): Admitting: Ophthalmology

## 2024-01-27 ENCOUNTER — Ambulatory Visit (INDEPENDENT_AMBULATORY_CARE_PROVIDER_SITE_OTHER): Admitting: Ophthalmology

## 2024-01-27 ENCOUNTER — Encounter (INDEPENDENT_AMBULATORY_CARE_PROVIDER_SITE_OTHER): Payer: Self-pay | Admitting: Ophthalmology

## 2024-01-27 DIAGNOSIS — H25013 Cortical age-related cataract, bilateral: Secondary | ICD-10-CM | POA: Diagnosis not present

## 2024-01-27 DIAGNOSIS — H353111 Nonexudative age-related macular degeneration, right eye, early dry stage: Secondary | ICD-10-CM

## 2024-01-27 DIAGNOSIS — H353221 Exudative age-related macular degeneration, left eye, with active choroidal neovascularization: Secondary | ICD-10-CM | POA: Diagnosis not present

## 2024-01-27 MED ORDER — FARICIMAB-SVOA 6 MG/0.05ML IZ SOSY
6.0000 mg | PREFILLED_SYRINGE | INTRAVITREAL | Status: AC | PRN
  Administered 2024-01-27: 6 mg via INTRAVITREAL

## 2024-02-24 NOTE — Progress Notes (Signed)
 [facility] - Clinic Note  03/01/2024   CHIEF COMPLAINT Patient presents for Retina Follow Up  HISTORY OF PRESENT ILLNESS: Justin Pennington is a 63 y.o. male who presents to the clinic today for:  HPI     Retina Follow Up   Patient presents with  Wet AMD.  In left eye.  This started 8 months ago.  Duration of 5 weeks.  Since onset it is stable.  I, the attending physician,  performed the HPI with the patient and updated documentation appropriately.        Comments   Pt denies changes in vision/FOL/floaters/pain. Pt uses Blink occasionally.      Last edited by [name], MD on 03/01/2024 12:16 PM.     Patient feels the vision in the left eye is improving from the injections.   Referring physician: [name], MD [address] [place],  [place] [address]  HISTORICAL INFORMATION:  Selected notes from the MEDICAL RECORD NUMBER Referred by Dr. [name] for ex ARMD OS LEE:  Ocular Hx- PMH-   CURRENT MEDICATIONS: No current outpatient medications on file. (Ophthalmic Drugs)   No current facility-administered medications for this visit. (Ophthalmic Drugs)   Current Outpatient Medications (Other)  Medication Sig   cetirizine  (ZYRTEC ) 10 MG tablet Take 1 tablet (10 mg total) by mouth daily.   Evolocumab  (REPATHA  SURECLICK) 140 MG/ML SOAJ Inject 1 mL into the skin every 14 (fourteen) days.   finasteride (PROPECIA) 1 MG tablet Take 1 mg by mouth daily.   levalbuterol  (XOPENEX  HFA) 45 MCG/ACT inhaler Inhale 2 puffs every 6 hours as needed   TRELEGY ELLIPTA  100-62.5-25 MCG/ACT AEPB INHALE 1 PUFF BY MOUTH EVERY DAY   No current facility-administered medications for this visit. (Other)   REVIEW OF SYSTEMS: ROS   Positive for: Eyes Last edited by Elnor Avelina GORMAN, COT on 03/01/2024  7:49 AM.       ALLERGIES Allergies  Allergen Reactions   Crestor [Rosuvastatin]     Joint stiffness   Lipitor [Atorvastatin  Calcium ] Other (See Comments)     Joint stiffness   Zetia [Ezetimibe] Other (See Comments)    Muscle aches   PAST MEDICAL HISTORY Past Medical History:  Diagnosis Date   Allergic conjunctivitis    Chronic obstructive asthma, unspecified    History of adenomatous polyp of colon 2005   Less than 1 cm   Hyperlipidemia    IBS (irritable bowel syndrome)    Diarrhea predominant   Insomnia, unspecified    Past Surgical History:  Procedure Laterality Date   COLONOSCOPY     GINGIVAL GRAFT     LEFT MENISCUS TEAR -ARTHROSCOPIC REPAIR     FAMILY HISTORY Family History  Problem Relation Age of Onset   Diabetes Mother        borderline diabetes   Cancer Father        BLADDER   Asthma Brother    Colon cancer Maternal Aunt 60   Stomach cancer Neg Hx    Colon polyps Neg Hx    Esophageal cancer Neg Hx    Rectal cancer Neg Hx    SOCIAL HISTORY Social History   Tobacco Use   Smoking status: Never   Smokeless tobacco: Never  Vaping Use   Vaping status: Never Used  Substance Use Topics   Alcohol use: Yes    Comment: occasional   Drug use: No       OPHTHALMIC EXAM:  Base  Eye Exam     Visual Acuity (Snellen - Linear)       Right Left   Dist [place] 20/25 +2 20/80 -2   Dist ph [place] 20/20 -1 20/40 -2         Tonometry (Tonopen, 7:54 AM)       Right Left   Pressure 20 21         Pupils       Pupils Dark Light Shape React APD   Right PERRL 3 2 Round Brisk None   Left PERRL 3 2 Round Brisk None         Visual Fields       Left Right    Full Full         Extraocular Movement       Right Left    Full, Ortho Full, Ortho         Neuro/Psych     Oriented x3: Yes   Mood/Affect: Normal         Dilation     Both eyes: 1.0% Mydriacyl, 2.5% Phenylephrine @ 7:55 AM           Slit Lamp and Fundus Exam     External Exam       Right Left   External Normal Normal         Slit Lamp Exam       Right Left   Lids/Lashes Dermatochalasis - upper lid Dermatochalasis - upper  lid   Conjunctiva/Sclera White and quiet White and quiet   Cornea Arcus, focal corneal haze 0400 Arcus   Anterior Chamber Deep and clear Deep and clear   Iris Round and dilated Round and dilated   Lens 2-3+ Nuclear sclerosis, 2+ Cortical cataract 2-3+ Nuclear sclerosis, 2+ Cortical cataract   Anterior Vitreous Vitreous syneresis Normal         Fundus Exam       Right Left   Disc Pink and sharp Pink and sharp   C/D Ratio 0.2 0.2   Macula Flat, good foveal reflex, Drusen, RPE mottling and clumping, No heme or edema Blunted foveal reflex, central CNV with edema -- slightly improved, no heme, fine drusen   Vessels attenuated, mild tortuosity attenuated, mild tortuosity   Periphery Attached, No heme Attached, No heme           IMAGING AND PROCEDURES  Imaging and Procedures for 03/01/2024  OCT, Retina - OU - Both Eyes       Right Eye Quality was good. Central Foveal Thickness: 307. Progression has been stable. Findings include normal foveal contour, no IRF, no SRF, retinal drusen , pigment epithelial detachment, vitreomacular adhesion .   Left Eye Quality was good. Central Foveal Thickness: 350. Progression has been stable. Findings include abnormal foveal contour, subretinal hyper-reflective material, choroidal neovascular membrane, pigment epithelial detachment, vitreomacular adhesion (stable central CNV/low PED w/ stable improvement in IRF, persistent SRF on nasal portion).   Notes *Images captured and stored on drive  Diagnosis / Impression:  OD: Non Ex. AMD - retinal drusen OS: ex ARMD - stable central CNV/low PED w/ stable improvement in IRF, persistent  SRF on nasal portion  Clinical management:  See below  Abbreviations: NFP - Normal foveal profile. CME - cystoid macular edema. PED - pigment epithelial detachment. IRF - intraretinal fluid. SRF - subretinal fluid. EZ - ellipsoid zone. ERM - epiretinal membrane. ORA - outer retinal atrophy. ORT - outer retinal  tubulation. SRHM - subretinal hyper-reflective material. IRHM -  intraretinal hyper-reflective material      Intravitreal Injection, Pharmacologic Agent - OS - Left Eye       Time Out 03/01/2024. 8:01 AM. Confirmed correct patient, procedure, site, and patient consented.   Anesthesia Topical anesthesia was used. Anesthetic medications included Lidocaine 2%, Proparacaine 0.5%.   Procedure Preparation included 5% betadine to ocular surface, eyelid speculum. A supplied (32g) needle was used.   Injection: 6 mg faricimab -svoa 6 MG/0.05ML   Route: Intravitreal, Site: Left Eye   NDC: 49757-903-93, Lot: [id number], Expiration date: 04/04/2025, Waste: 0 mL   Post-op Post injection exam found visual acuity of at least counting fingers. The patient tolerated the procedure well. There were no complications. The patient received written and verbal post procedure care education.             ASSESSMENT/PLAN:   ICD-10-CM   1. Exudative age-related macular degeneration of left eye with active choroidal neovascularization (HCC)  H35.3221 OCT, Retina - OU - Both Eyes    Intravitreal Injection, Pharmacologic Agent - OS - Left Eye    faricimab -svoa (VABYSMO ) 6mg /0.59mL intravitreal injection    2. 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 Cortical age-related cataract of both eyes  H25.013       1. 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, OS - s/p IVA OS #1 (01.02.25), #2 (01.30.25), #3 (02.27.25) -- IVA resistance =========================== - s/p IVE OS #1 (03.27.25), #2 (04.24.25), #3 (05.22.25) -- IVE resistance ===========================  - s/p IVV OS #1 (06.23.25), #2 (07.24.25) - pt reports 1 mo history of blurred vision and distortion, affecting his golfing - BCVA OS 20/40 - stable - OCT OS shows: stable central CNV/low PED w/ stable improvement in IRF, persistent SRF on nasal portion at 4.9 weeks   - recommend IVV OS #3 today  08.27.25 w/ f/u in 4 wks - pt wishes to be treated with IVV - RBA of procedure discussed, questions answered - IVV informed consent obtained and signed 06.23.25 - see procedure note - Eylea  and Vabysmo  approved for one eye only -- insurance paying 100%  - f/u in 4 wks -- DFE/OCT, possible injection   2. Age related macular degeneration, non-exudative, OD - The incidence, anatomy, and pathology of dry AMD, risk of progression, and the AREDS and AREDS 2 studies including smoking risks discussed with patient.  - recommend Amsler grid monitoring  3.  Mixed Cataract OU - The symptoms of cataract, surgical options, and treatments and risks were discussed with patient. - discussed diagnosis and progression - under the expert care of [facility] - monitor  Ophthalmic Meds Ordered this visit:  Meds ordered this encounter  Medications   faricimab -svoa (VABYSMO ) 6mg /0.58mL intravitreal injection     Return in about 4 weeks (around 03/29/2024) for fu Ex. AMD OU , DFE, OCT, Possible, IVV, OS.  There are no Patient Instructions on file for this visit.  Explained the diagnoses, plan, and follow up with the patient and they expressed understanding.  Patient expressed understanding of the importance of proper follow up care.   This document serves as a record of services personally performed by Redell JUDITHANN Hans, MD, PhD. It was created on their behalf by Almetta Pesa, an ophthalmic technician. The creation of this record is the provider's dictation and/or activities during the visit.    Electronically signed by: Almetta Pesa, OA, 03/01/24  12:19 PM  This document serves as a record of services personally performed by  Redell JUDITHANN Hans, MD, PhD. It was created on their behalf by Wanda GEANNIE Keens, COT an ophthalmic technician. The creation of this record is the provider's dictation and/or activities during the visit.    Electronically signed by:  Wanda GEANNIE Keens, COT  03/01/24 12:19  PM  Redell JUDITHANN Hans, M.D., Ph.D. Diseases & Surgery of the Retina and Vitreous [facility]  I have reviewed the above documentation for accuracy and completeness, and I agree with the above. Redell JUDITHANN Hans, M.D., Ph.D. 03/01/24 12:19 PM   Abbreviations: M myopia (nearsighted); A astigmatism; H hyperopia (farsighted); P presbyopia; Mrx spectacle prescription;  CTL contact lenses; OD right eye; OS left eye; OU both eyes  XT exotropia; ET esotropia; PEK punctate epithelial keratitis; PEE punctate epithelial erosions; DES dry eye syndrome; MGD meibomian gland dysfunction; ATs artificial tears; PFAT's preservative free artificial tears; NSC nuclear sclerotic cataract; PSC posterior subcapsular cataract; ERM epi-retinal membrane; PVD posterior vitreous detachment; RD retinal detachment; DM diabetes mellitus; DR diabetic retinopathy; NPDR non-proliferative diabetic retinopathy; PDR proliferative diabetic retinopathy; CSME clinically significant macular edema; DME diabetic macular edema; dbh dot blot hemorrhages; CWS cotton wool spot; POAG primary open angle glaucoma; C/D cup-to-disc ratio; HVF humphrey visual field; GVF goldmann visual field; OCT optical coherence tomography; IOP intraocular pressure; BRVO Branch retinal vein occlusion; CRVO central retinal vein occlusion; CRAO central retinal artery occlusion; BRAO branch retinal artery occlusion; RT retinal tear; SB scleral buckle; PPV pars plana vitrectomy; VH Vitreous hemorrhage; PRP panretinal laser photocoagulation; IVK intravitreal kenalog; VMT vitreomacular traction; MH Macular hole;  NVD neovascularization of the disc; NVE neovascularization elsewhere; AREDS age related eye disease study; ARMD age related macular degeneration; POAG primary open angle glaucoma; EBMD epithelial/anterior basement membrane dystrophy; ACIOL anterior chamber intraocular lens; IOL intraocular lens; PCIOL posterior chamber intraocular lens; Phaco/IOL  phacoemulsification with intraocular lens placement; PRK photorefractive keratectomy; LASIK laser assisted in situ keratomileusis; HTN hypertension; DM diabetes mellitus; COPD chronic obstructive pulmonary disease

## 2024-03-01 ENCOUNTER — Ambulatory Visit (INDEPENDENT_AMBULATORY_CARE_PROVIDER_SITE_OTHER): Admitting: Ophthalmology

## 2024-03-01 ENCOUNTER — Encounter (INDEPENDENT_AMBULATORY_CARE_PROVIDER_SITE_OTHER): Payer: Self-pay | Admitting: Ophthalmology

## 2024-03-01 DIAGNOSIS — H25013 Cortical age-related cataract, bilateral: Secondary | ICD-10-CM

## 2024-03-01 DIAGNOSIS — H353221 Exudative age-related macular degeneration, left eye, with active choroidal neovascularization: Secondary | ICD-10-CM

## 2024-03-01 DIAGNOSIS — H353111 Nonexudative age-related macular degeneration, right eye, early dry stage: Secondary | ICD-10-CM | POA: Diagnosis not present

## 2024-03-01 MED ORDER — FARICIMAB-SVOA 6 MG/0.05ML IZ SOSY
6.0000 mg | PREFILLED_SYRINGE | INTRAVITREAL | Status: AC | PRN
  Administered 2024-03-01: 6 mg via INTRAVITREAL

## 2024-03-16 ENCOUNTER — Ambulatory Visit: Payer: Self-pay | Admitting: Internal Medicine

## 2024-03-16 NOTE — Telephone Encounter (Signed)
 FYI Only or Action Required?: Action required by provider: requesting Rx.  Patient is followed in Pulmonology for COPD, last seen on 04/30/2023 by Neysa Reggy BIRCH, MD.  Called Nurse Triage reporting Cough.  Symptoms began several weeks ago.  Interventions attempted: Prescription medications: finishes abx from PCP today, Rescue inhaler, and Maintenance inhaler.  Symptoms are: unchanged.  Triage Disposition: See PCP When Office is Open (Within 3 Days)  Patient/caregiver understands and will follow disposition?: No, wishes to speak with PCP       Copied from CRM #[id number]. Topic: Clinical - Red Word Triage >> Mar 16, 2024  4:07 PM Justin Pennington wrote: Red Word that prompted transfer to Nurse Triage: shortness breath, lingering cough Reason for Disposition  Cough has been present for > 3 weeks  Answer Assessment - Initial Assessment Questions E2C2 Pulmonary Triage - Initial Assessment Questions Chief Complaint (e.g., cough, sob, wheezing, fever, chills, sweat or additional symptoms) *Go to specific symptom protocol after initial questions. Cough, sob Reports went to PCP and was prescribed abx -- pt reports will complete today  How long have symptoms been present? A few weeks  Have you tested for COVID or Flu? Note: If not, ask patient if a home test can be taken. If so, instruct patient to call back for positive results. No  MEDICINES:   Have you used any OTC meds to help with symptoms? No If yes, ask What medications? N/a  Have you used your inhalers/maintenance medication? Yes If yes, What medications? TRELEGY ELLIPTA  Albuterol  PRN - has been using past few days with relief  If inhaler, ask How many puffs and how often? Note: Review instructions on medication in the chart. See above  OXYGEN: Do you wear supplemental oxygen? No If yes, How many liters are you supposed to use? N/a  Do you monitor your oxygen levels? No If yes, What is your  reading (oxygen level) today? N/a  What is your usual oxygen saturation reading?  (Note: Pulmonary O2 sats should be 90% or greater) N/a      1. ONSET: When did the cough begin?      See above 2. SEVERITY: How bad is the cough today?      lingering 3. SPUTUM: Describe the color of your sputum (e.g., none, dry cough; clear, white, yellow, green)     clear 4. HEMOPTYSIS: Are you coughing up any blood? If Yes, ask: How much? (e.g., flecks, streaks, tablespoons, etc.)     denies 5. DIFFICULTY BREATHING: Are you having difficulty breathing? If Yes, ask: How bad is it? (e.g., mild, moderate, severe)      Mild [facility] does not appreciate audible SOB/wheezing during call. Pt is speaking in full sentences.  6. FEVER: Do you have a fever? If Yes, ask: What is your temperature, how was it measured, and when did it start?     denies 7. CARDIAC HISTORY: Do you have any history of heart disease? (e.g., heart attack, congestive heart failure)      denies 8. LUNG HISTORY: Do you have any history of lung disease?  (e.g., pulmonary embolus, asthma, emphysema)     COPD 9. PE RISK FACTORS: Do you have a history of blood clots? (or: recent major surgery, recent prolonged travel, bedridden)     denies 10. OTHER SYMPTOMS: Do you have any other symptoms? (e.g., runny nose, wheezing, chest pain)       denies 11. PREGNANCY: Is there any chance you are pregnant? When was your last menstrual period?  N/a 12. TRAVEL: Have you traveled out of the country in the last month? (e.g., travel history, exposures)       N/a    [facility] attempted to schedule with [facility], but no access. Pt requesting steroids.  [facility] will forward encounter for Dr Neysa 's office to review and advise. Patient verbalized understanding and is expecting call back from office for next steps. [facility] also advised that if pt does not hear back from office, to follow disposition for further  evaluation/treatment.  Protocols used: Cough - Acute Productive-A-AH

## 2024-03-17 ENCOUNTER — Ambulatory Visit: Admitting: Adult Health

## 2024-03-17 NOTE — Telephone Encounter (Signed)
 Pt scheduled for video at 2 today

## 2024-03-17 NOTE — Telephone Encounter (Addendum)
 Please advise pt is requesting steroids, he saw PCP earlier this week and received ABX he is finished with those and still has lingering cough   Has not been seen since 04/2023 , was seen by PCP this week, needs to call their office for ongoing symptoms and rx requests.   Can see for sick virtual visit if needed today or work into schedule next week if needed   Please contact office for sooner follow up if symptoms do not improve or worsen or seek emergency care

## 2024-03-22 NOTE — Progress Notes (Signed)
 [facility] - Clinic Note  03/29/2024   CHIEF COMPLAINT Patient presents for Retina Follow Up  HISTORY OF PRESENT ILLNESS: Justin Pennington is a 63 y.o. male who presents to the clinic today for:  HPI     Retina Follow Up   Patient presents with  Wet AMD.  In both eyes.  This started 4 weeks ago.  I, the attending physician,  performed the HPI with the patient and updated documentation appropriately.        Comments   Patient here for 4 weeks retina follow up for exu ARMD OU.  Patient states vision seems to be a little better. Not as blurry as was. No eye pain.       Last edited by [name], MD on 03/29/2024  4:54 PM.     Patient feels the vision is improving.   Referring physician: [name], MD [address] [place],  [place] [address]  HISTORICAL INFORMATION:  Selected notes from the MEDICAL RECORD NUMBER Referred by Dr. [name] for ex ARMD OS LEE:  Ocular Hx- PMH-   CURRENT MEDICATIONS: No current outpatient medications on file. (Ophthalmic Drugs)   No current facility-administered medications for this visit. (Ophthalmic Drugs)   Current Outpatient Medications (Other)  Medication Sig   cetirizine  (ZYRTEC ) 10 MG tablet Take 1 tablet (10 mg total) by mouth daily.   Evolocumab  (REPATHA  SURECLICK) 140 MG/ML SOAJ Inject 1 mL into the skin every 14 (fourteen) days.   finasteride (PROPECIA) 1 MG tablet Take 1 mg by mouth daily.   levalbuterol  (XOPENEX  HFA) 45 MCG/ACT inhaler Inhale 2 puffs every 6 hours as needed   TRELEGY ELLIPTA  100-62.5-25 MCG/ACT AEPB INHALE 1 PUFF BY MOUTH EVERY DAY   No current facility-administered medications for this visit. (Other)   REVIEW OF SYSTEMS: ROS   Positive for: Eyes Last edited by Orval Asberry GORMAN, COA on 03/29/2024  8:10 AM.        ALLERGIES Allergies  Allergen Reactions   Crestor [Rosuvastatin]     Joint stiffness   Lipitor [Atorvastatin  Calcium ] Other (See Comments)    Joint  stiffness   Zetia [Ezetimibe] Other (See Comments)    Muscle aches   PAST MEDICAL HISTORY Past Medical History:  Diagnosis Date   Allergic conjunctivitis    Chronic obstructive asthma, unspecified    History of adenomatous polyp of colon 2005   Less than 1 cm   Hyperlipidemia    IBS (irritable bowel syndrome)    Diarrhea predominant   Insomnia, unspecified    Past Surgical History:  Procedure Laterality Date   COLONOSCOPY     GINGIVAL GRAFT     LEFT MENISCUS TEAR -ARTHROSCOPIC REPAIR     FAMILY HISTORY Family History  Problem Relation Age of Onset   Diabetes Mother        borderline diabetes   Cancer Father        BLADDER   Asthma Brother    Colon cancer Maternal Aunt 60   Stomach cancer Neg Hx    Colon polyps Neg Hx    Esophageal cancer Neg Hx    Rectal cancer Neg Hx    SOCIAL HISTORY Social History   Tobacco Use   Smoking status: Never   Smokeless tobacco: Never  Vaping Use   Vaping status: Never Used  Substance Use Topics   Alcohol use: Yes    Comment: occasional   Drug use: No  OPHTHALMIC EXAM:  Base Eye Exam     Visual Acuity (Snellen - Linear)       Right Left   Dist [place] 20/25 -2 20/80 -1   Dist ph [place] 20/20 20/40 +2         Tonometry (Tonopen, 8:08 AM)       Right Left   Pressure 20 18         Pupils       Dark Light Shape React APD   Right 3 2 Round Brisk None   Left 3 2 Round Brisk None         Visual Fields (Counting fingers)       Left Right    Full Full         Extraocular Movement       Right Left    Full, Ortho Full, Ortho         Neuro/Psych     Oriented x3: Yes   Mood/Affect: Normal         Dilation     Both eyes: 1.0% Mydriacyl, 2.5% Phenylephrine @ 8:08 AM           Slit Lamp and Fundus Exam     External Exam       Right Left   External Normal Normal         Slit Lamp Exam       Right Left   Lids/Lashes Dermatochalasis - upper lid Dermatochalasis - upper lid    Conjunctiva/Sclera White and quiet White and quiet   Cornea Arcus, focal corneal haze 0400 Arcus   Anterior Chamber Deep and clear Deep and clear   Iris Round and dilated Round and dilated   Lens 2-3+ Nuclear sclerosis, 2+ Cortical cataract 2-3+ Nuclear sclerosis, 2+ Cortical cataract   Anterior Vitreous Vitreous syneresis Normal         Fundus Exam       Right Left   Disc Pink and sharp Pink and sharp   C/D Ratio 0.2 0.2   Macula Flat, good foveal reflex, Drusen, RPE mottling and clumping, No heme or edema Blunted foveal reflex, central CNV with edema -- slightly improved, no heme, fine drusen, early subretinal fibrosis   Vessels attenuated, mild tortuosity attenuated, mild tortuosity   Periphery Attached, No heme Attached, No heme           IMAGING AND PROCEDURES  Imaging and Procedures for 03/29/2024  OCT, Retina - OU - Both Eyes       Right Eye Quality was good. Central Foveal Thickness: 302. Progression has been stable. Findings include normal foveal contour, no IRF, no SRF, retinal drusen , pigment epithelial detachment, vitreomacular adhesion .   Left Eye Quality was good. Central Foveal Thickness: 305. Progression has improved. Findings include abnormal foveal contour, subretinal hyper-reflective material, choroidal neovascular membrane, pigment epithelial detachment, vitreomacular adhesion (stable central CNV/low PED w/ stable improvement in IRF, and interval improvement in SRF on nasal portion).   Notes *Images captured and stored on drive  Diagnosis / Impression:  OD: Non Ex. AMD - retinal drusen OS: ex ARMD - stable central CNV/low PED w/ stable improvement in IRF, and interval improvement in SRF on nasal portion  Clinical management:  See below  Abbreviations: NFP - Normal foveal profile. CME - cystoid macular edema. PED - pigment epithelial detachment. IRF - intraretinal fluid. SRF - subretinal fluid. EZ - ellipsoid zone. ERM - epiretinal membrane. ORA -  outer retinal atrophy. ORT -  outer retinal tubulation. SRHM - subretinal hyper-reflective material. IRHM - intraretinal hyper-reflective material      Intravitreal Injection, Pharmacologic Agent - OS - Left Eye       Time Out 03/29/2024. 8:37 AM. Confirmed correct patient, procedure, site, and patient consented.   Anesthesia Topical anesthesia was used. Anesthetic medications included Lidocaine 2%, Proparacaine 0.5%.   Procedure Preparation included 5% betadine to ocular surface, eyelid speculum. A supplied (32g) needle was used.   Injection: 6 mg faricimab -svoa 6 MG/0.05ML   Route: Intravitreal, Site: Left Eye   NDC: 49757-903-93, Lot: [id number], Expiration date: 04/04/2025, Waste: 0 mL   Post-op Post injection exam found visual acuity of at least counting fingers. The patient tolerated the procedure well. There were no complications. The patient received written and verbal post procedure care education.           ASSESSMENT/PLAN:   ICD-10-CM   1. Exudative age-related macular degeneration of left eye with active choroidal neovascularization (HCC)  H35.3221 OCT, Retina - OU - Both Eyes    Intravitreal Injection, Pharmacologic Agent - OS - Left Eye    faricimab -svoa (VABYSMO ) 6mg /0.41mL intravitreal injection    2. 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, OS - s/p IVA OS #1 (01.02.25), #2 (01.30.25), #3 (02.27.25) -- IVA resistance ========= - s/p IVE OS #1 (03.27.25), #2 (04.24.25), #3 (05.22.25) -- IVE resistance =========  - s/p IVV OS #1 (06.23.25), #2 (07.24.25), #3 (08.27.25) - pt reports 1 mo history of blurred vision and distortion, affecting his golfing - BCVA OS 20/40 - stable - OCT OS shows: stable central CNV/low PED w/ stable improvement in IRF, and interval improvement in SRF on nasal portion at 4 weeks    - recommend IVV OS #4 today 09.24.25 w/ f/u in 4 wks - pt wishes to be treated with IVV - RBA of procedure discussed, questions answered - IVV informed consent obtained and signed 06.23.25 - see procedure note - Eylea  and Vabysmo  approved for one eye only -- insurance paying 100%  - f/u in 4 wks -- DFE/OCT, possible injection   2. Age related macular degeneration, non-exudative, OD - The incidence, anatomy, and pathology of dry AMD, risk of progression, and the AREDS and AREDS 2 studies including smoking risks discussed with patient.  - recommend Amsler grid monitoring  3.  Mixed Cataract OU - The symptoms of cataract, surgical options, and treatments and risks were discussed with patient. - discussed diagnosis and progression - under the expert care of [facility] - monitor  Ophthalmic Meds Ordered this visit:  Meds ordered this encounter  Medications   faricimab -svoa (VABYSMO ) 6mg /0.59mL intravitreal injection     Return in about 4 weeks (around 04/26/2024) for f/u, Ex. AMD, DFE, OCT, Possible, IVV, OS.  There are no Patient Instructions on file for this visit.  Explained the diagnoses, plan, and follow up with the patient and they expressed understanding.  Patient expressed understanding of the importance of proper follow up care.   This document serves as a record of services personally performed by Redell JUDITHANN Hans, MD, PhD. It was created on their behalf by Almetta Pesa, an ophthalmic technician. The creation of this record is the provider's dictation and/or activities during the visit.    Electronically signed by: Almetta Pesa, OA, 03/29/24  4:55 PM  Redell  JUDITHANN Hans, M.D., Ph.D. Diseases & Surgery of the Retina and Vitreous [facility]  I have reviewed the above documentation for accuracy and completeness, and I agree with the above. Redell JUDITHANN Hans, M.D., Ph.D. 03/29/24 4:55 PM   Abbreviations: M myopia (nearsighted); A astigmatism;  H hyperopia (farsighted); P presbyopia; Mrx spectacle prescription;  CTL contact lenses; OD right eye; OS left eye; OU both eyes  XT exotropia; ET esotropia; PEK punctate epithelial keratitis; PEE punctate epithelial erosions; DES dry eye syndrome; MGD meibomian gland dysfunction; ATs artificial tears; PFAT's preservative free artificial tears; NSC nuclear sclerotic cataract; PSC posterior subcapsular cataract; ERM epi-retinal membrane; PVD posterior vitreous detachment; RD retinal detachment; DM diabetes mellitus; DR diabetic retinopathy; NPDR non-proliferative diabetic retinopathy; PDR proliferative diabetic retinopathy; CSME clinically significant macular edema; DME diabetic macular edema; dbh dot blot hemorrhages; CWS cotton wool spot; POAG primary open angle glaucoma; C/D cup-to-disc ratio; HVF humphrey visual field; GVF goldmann visual field; OCT optical coherence tomography; IOP intraocular pressure; BRVO Branch retinal vein occlusion; CRVO central retinal vein occlusion; CRAO central retinal artery occlusion; BRAO branch retinal artery occlusion; RT retinal tear; SB scleral buckle; PPV pars plana vitrectomy; VH Vitreous hemorrhage; PRP panretinal laser photocoagulation; IVK intravitreal kenalog; VMT vitreomacular traction; MH Macular hole;  NVD neovascularization of the disc; NVE neovascularization elsewhere; AREDS age related eye disease study; ARMD age related macular degeneration; POAG primary open angle glaucoma; EBMD epithelial/anterior basement membrane dystrophy; ACIOL anterior chamber intraocular lens; IOL intraocular lens; PCIOL posterior chamber intraocular lens; Phaco/IOL phacoemulsification with intraocular lens placement; PRK photorefractive keratectomy; LASIK laser assisted in situ keratomileusis; HTN hypertension; DM diabetes mellitus; COPD chronic obstructive pulmonary disease

## 2024-03-29 ENCOUNTER — Encounter (INDEPENDENT_AMBULATORY_CARE_PROVIDER_SITE_OTHER): Payer: Self-pay | Admitting: Ophthalmology

## 2024-03-29 ENCOUNTER — Ambulatory Visit (INDEPENDENT_AMBULATORY_CARE_PROVIDER_SITE_OTHER): Admitting: Ophthalmology

## 2024-03-29 DIAGNOSIS — H353221 Exudative age-related macular degeneration, left eye, with active choroidal neovascularization: Secondary | ICD-10-CM | POA: Diagnosis not present

## 2024-03-29 DIAGNOSIS — H25013 Cortical age-related cataract, bilateral: Secondary | ICD-10-CM | POA: Diagnosis not present

## 2024-03-29 DIAGNOSIS — H353111 Nonexudative age-related macular degeneration, right eye, early dry stage: Secondary | ICD-10-CM

## 2024-03-29 MED ORDER — FARICIMAB-SVOA 6 MG/0.05ML IZ SOSY
6.0000 mg | PREFILLED_SYRINGE | INTRAVITREAL | Status: AC | PRN
  Administered 2024-03-29: 6 mg via INTRAVITREAL

## 2024-04-19 NOTE — Progress Notes (Signed)
 [facility] - Clinic Note  04/26/2024   CHIEF COMPLAINT Patient presents for Retina Follow Up  HISTORY OF PRESENT ILLNESS: Justin Pennington is a 63 y.o. male who presents to the clinic today for:  HPI     Retina Follow Up   Patient presents with  Wet AMD.  In left eye.  Severity is moderate.  Duration of 4 weeks.  Since onset it is stable.  I, the attending physician,  performed the HPI with the patient and updated documentation appropriately.        Comments   4 week Retina eval. Patient states no changes in vision noticed      Last edited by Justin Rogue, MD on 04/26/2024 10:00 PM.    Pt states   Referring physician: [name], MD [address] [place],  [place] [address]  HISTORICAL INFORMATION:  Selected notes from the MEDICAL RECORD NUMBER Referred by Dr. GORMAN Justin for ex ARMD OS LEE:  Ocular Hx- PMH-   CURRENT MEDICATIONS: No current outpatient medications on file. (Ophthalmic Drugs)   No current facility-administered medications for this visit. (Ophthalmic Drugs)   Current Outpatient Medications (Other)  Medication Sig   cetirizine  (ZYRTEC ) 10 MG tablet Take 1 tablet (10 mg total) by mouth daily.   Evolocumab  (REPATHA  SURECLICK) 140 MG/ML SOAJ Inject 1 mL into the skin every 14 (fourteen) days.   finasteride (PROPECIA) 1 MG tablet Take 1 mg by mouth daily.   levalbuterol  (XOPENEX  HFA) 45 MCG/ACT inhaler Inhale 2 puffs every 6 hours as needed   TRELEGY ELLIPTA  100-62.5-25 MCG/ACT AEPB INHALE 1 PUFF BY MOUTH EVERY DAY   No current facility-administered medications for this visit. (Other)   REVIEW OF SYSTEMS: ROS   Positive for: Eyes Last edited by Justin Pennington BRAVO, COT on 04/26/2024  8:55 AM.     ALLERGIES Allergies  Allergen Reactions   Crestor [Rosuvastatin]     Joint stiffness   Lipitor [Atorvastatin  Calcium ] Other (See Comments)    Joint stiffness   Zetia [Ezetimibe] Other (See Comments)    Muscle aches    PAST MEDICAL HISTORY Past Medical History:  Diagnosis Date   Allergic conjunctivitis    Chronic obstructive asthma, unspecified    History of adenomatous polyp of colon 2005   Less than 1 cm   Hyperlipidemia    IBS (irritable bowel syndrome)    Diarrhea predominant   Insomnia, unspecified    Past Surgical History:  Procedure Laterality Date   COLONOSCOPY     GINGIVAL GRAFT     LEFT MENISCUS TEAR -ARTHROSCOPIC REPAIR     FAMILY HISTORY Family History  Problem Relation Age of Onset   Diabetes Mother        borderline diabetes   Cancer Father        BLADDER   Asthma Brother    Colon cancer Maternal Aunt 60   Stomach cancer Neg Hx    Colon polyps Neg Hx    Esophageal cancer Neg Hx    Rectal cancer Neg Hx    SOCIAL HISTORY Social History   Tobacco Use   Smoking status: Never   Smokeless tobacco: Never  Vaping Use   Vaping status: Never Used  Substance Use Topics   Alcohol use: Yes    Comment: occasional   Drug use: No       OPHTHALMIC EXAM:  Base Eye Exam     Visual Acuity (Snellen - Linear)  Right Left   Dist [place] 20/20 -2 20/70 +1   Dist ph [place]  20/50 -1         Tonometry (Tonopen, 9:02 AM)       Right Left   Pressure 19 17         Pupils       Dark Light Shape React APD   Right 3 2 Round Brisk None   Left 3 2 Round Brisk None         Visual Fields (Counting fingers)       Left Right    Full Full         Extraocular Movement       Right Left    Full, Ortho Full, Ortho         Neuro/Psych     Oriented x3: Yes   Mood/Affect: Normal         Dilation     Both eyes: 1.0% Mydriacyl, 2.5% Phenylephrine @ 9:02 AM           Slit Lamp and Fundus Exam     External Exam       Right Left   External Normal Normal         Slit Lamp Exam       Right Left   Lids/Lashes Dermatochalasis - upper lid Dermatochalasis - upper lid   Conjunctiva/Sclera White and quiet White and quiet   Cornea Arcus, focal corneal  haze 0400 Arcus   Anterior Chamber Deep and clear Deep and clear   Iris Round and dilated Round and dilated   Lens 2-3+ Nuclear sclerosis, 2+ Cortical cataract 2-3+ Nuclear sclerosis, 2+ Cortical cataract   Anterior Vitreous Vitreous syneresis Normal         Fundus Exam       Right Left   Disc Pink and sharp Pink and sharp   C/D Ratio 0.2 0.2   Macula Flat, good foveal reflex, Drusen, RPE mottling and clumping, No heme or edema Blunted foveal reflex, central CNV with edema, no heme, fine drusen, early subretinal fibrosis   Vessels attenuated, mild tortuosity attenuated, mild tortuosity   Periphery Attached, No heme Attached, No heme           IMAGING AND PROCEDURES  Imaging and Procedures for 04/26/2024  OCT, Retina - OU - Both Eyes       Right Eye Quality was good. Central Foveal Thickness: 303. Progression has been stable. Findings include normal foveal contour, no IRF, no SRF, retinal drusen , pigment epithelial detachment, vitreomacular adhesion .   Left Eye Quality was good. Central Foveal Thickness: 313. Progression has been stable. Findings include abnormal foveal contour, subretinal hyper-reflective material, choroidal neovascular membrane, pigment epithelial detachment, vitreomacular adhesion (stable central CNV/low PED w/ stable improvement in IRF and SRF on nasal portion--trace residual fluid).   Notes *Images captured and stored on drive  Diagnosis / Impression:  OD: Non Ex. AMD - retinal drusen OS: ex ARMD - stable central CNV/low PED w/ stable improvement in IRF and SRF on nasal portion--trace residual fluid  Clinical management:  See below  Abbreviations: NFP - Normal foveal profile. CME - cystoid macular edema. PED - pigment epithelial detachment. IRF - intraretinal fluid. SRF - subretinal fluid. EZ - ellipsoid zone. ERM - epiretinal membrane. ORA - outer retinal atrophy. ORT - outer retinal tubulation. SRHM - subretinal hyper-reflective material. IRHM -  intraretinal hyper-reflective material      Intravitreal Injection, Pharmacologic Agent - OS -  Left Eye       Time Out 04/26/2024. 10:50 AM. Confirmed correct patient, procedure, site, and patient consented.   Anesthesia Topical anesthesia was used. Anesthetic medications included Lidocaine 2%, Proparacaine 0.5%.   Procedure Preparation included 5% betadine to ocular surface, eyelid speculum. A supplied (32g) needle was used.   Injection: 6 mg faricimab -svoa 6 MG/0.05ML   Route: Intravitreal, Site: Left Eye   NDC: 49757-903-93, Lot: [id number], Expiration date: 07/04/2025, Waste: 0 mL   Post-op Post injection exam found visual acuity of at least counting fingers. The patient tolerated the procedure well. There were no complications. The patient received written and verbal post procedure care education.           ASSESSMENT/PLAN:   ICD-10-CM   1. Exudative age-related macular degeneration of left eye with active choroidal neovascularization (HCC)  H35.3221 OCT, Retina - OU - Both Eyes    Intravitreal Injection, Pharmacologic Agent - OS - Left Eye    faricimab -svoa (VABYSMO ) 6mg /0.18mL intravitreal injection    2. 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, OS - s/p IVA OS #1 (01.02.25), #2 (01.30.25), #3 (02.27.25) -- IVA resistance ========= - s/p IVE OS #1 (03.27.25), #2 (04.24.25), #3 (05.22.25) -- IVE resistance ========= - s/p IVV OS #1 (06.23.25), #2 (07.24.25), #3 (08.27.25), #4 (09.24.25) - pt reports 1 mo history of blurred vision and distortion, affecting his golfing - BCVA OS 20/40 - stable - OCT OS shows: stable central CNV/low PED w/ stable improvement in IRF and SRF on nasal portion--trace residual fluid at 4 weeks   - recommend IVV OS #5 today 10.22.25 w/ f/u in 4 wks - pt wishes to be treated with IVV -  RBA of procedure discussed, questions answered - IVV informed consent obtained and signed 06.23.25 - see procedure note - Eylea  and Vabysmo  approved for one eye only -- insurance paying 100%  - f/u in 4 wks -- DFE/OCT, possible injection   2. Age related macular degeneration, non-exudative, OD - The incidence, anatomy, and pathology of dry AMD, risk of progression, and the AREDS and AREDS 2 studies including smoking risks discussed with patient.  - recommend Amsler grid monitoring  3.  Mixed Cataract OU - The symptoms of cataract, surgical options, and treatments and risks were discussed with patient. - discussed diagnosis and progression - under the expert care of [facility] - monitor  Ophthalmic Meds Ordered this visit:  Meds ordered this encounter  Medications   faricimab -svoa (VABYSMO ) 6mg /0.30mL intravitreal injection     Return in about 4 weeks (around 05/24/2024) for exu ARMD OU, DFE, OCT, Possible Injxn.  There are no Patient Instructions on file for this visit.  Explained the diagnoses, plan, and follow up with the patient and they expressed understanding.  Patient expressed understanding of the importance of proper follow up care.   This document serves as a record of services personally performed by Redell JUDITHANN Hans, MD, PhD. It was created on their behalf by Almetta Pesa, an ophthalmic technician. The creation of this record is the provider's dictation and/or activities during the visit.    Electronically signed by: Almetta Pesa, OA, 04/26/24  10:01 PM  This document serves as a record of services personally performed by Redell JUDITHANN Hans, MD, PhD. It was created on their behalf by Wanda GEANNIE Keens, COT  an ophthalmic technician. The creation of this record is the provider's dictation and/or activities during the visit.    Electronically signed by:  Wanda GEANNIE Keens, COT  04/26/24 10:01 PM  Redell JUDITHANN Hans, M.D., Ph.D. Diseases & Surgery of the Retina  and Vitreous [facility]  I have reviewed the above documentation for accuracy and completeness, and I agree with the above. Redell JUDITHANN Hans, M.D., Ph.D. 04/26/24 10:03 PM    Abbreviations: M myopia (nearsighted); A astigmatism; H hyperopia (farsighted); P presbyopia; Mrx spectacle prescription;  CTL contact lenses; OD right eye; OS left eye; OU both eyes  XT exotropia; ET esotropia; PEK punctate epithelial keratitis; PEE punctate epithelial erosions; DES dry eye syndrome; MGD meibomian gland dysfunction; ATs artificial tears; PFAT's preservative free artificial tears; NSC nuclear sclerotic cataract; PSC posterior subcapsular cataract; ERM epi-retinal membrane; PVD posterior vitreous detachment; RD retinal detachment; DM diabetes mellitus; DR diabetic retinopathy; NPDR non-proliferative diabetic retinopathy; PDR proliferative diabetic retinopathy; CSME clinically significant macular edema; DME diabetic macular edema; dbh dot blot hemorrhages; CWS cotton wool spot; POAG primary open angle glaucoma; C/D cup-to-disc ratio; HVF humphrey visual field; GVF goldmann visual field; OCT optical coherence tomography; IOP intraocular pressure; BRVO Branch retinal vein occlusion; CRVO central retinal vein occlusion; CRAO central retinal artery occlusion; BRAO branch retinal artery occlusion; RT retinal tear; SB scleral buckle; PPV pars plana vitrectomy; VH Vitreous hemorrhage; PRP panretinal laser photocoagulation; IVK intravitreal kenalog; VMT vitreomacular traction; MH Macular hole;  NVD neovascularization of the disc; NVE neovascularization elsewhere; AREDS age related eye disease study; ARMD age related macular degeneration; POAG primary open angle glaucoma; EBMD epithelial/anterior basement membrane dystrophy; ACIOL anterior chamber intraocular lens; IOL intraocular lens; PCIOL posterior chamber intraocular lens; Phaco/IOL phacoemulsification with intraocular lens placement; PRK photorefractive  keratectomy; LASIK laser assisted in situ keratomileusis; HTN hypertension; DM diabetes mellitus; COPD chronic obstructive pulmonary disease

## 2024-04-26 ENCOUNTER — Ambulatory Visit (INDEPENDENT_AMBULATORY_CARE_PROVIDER_SITE_OTHER): Admitting: Ophthalmology

## 2024-04-26 ENCOUNTER — Encounter (INDEPENDENT_AMBULATORY_CARE_PROVIDER_SITE_OTHER): Admitting: Ophthalmology

## 2024-04-26 ENCOUNTER — Encounter (INDEPENDENT_AMBULATORY_CARE_PROVIDER_SITE_OTHER): Payer: Self-pay | Admitting: Ophthalmology

## 2024-04-26 DIAGNOSIS — H353111 Nonexudative age-related macular degeneration, right eye, early dry stage: Secondary | ICD-10-CM | POA: Diagnosis not present

## 2024-04-26 DIAGNOSIS — H353221 Exudative age-related macular degeneration, left eye, with active choroidal neovascularization: Secondary | ICD-10-CM | POA: Diagnosis not present

## 2024-04-26 DIAGNOSIS — H25013 Cortical age-related cataract, bilateral: Secondary | ICD-10-CM | POA: Diagnosis not present

## 2024-04-26 MED ORDER — FARICIMAB-SVOA 6 MG/0.05ML IZ SOSY
6.0000 mg | PREFILLED_SYRINGE | INTRAVITREAL | Status: AC | PRN
  Administered 2024-04-26: 6 mg via INTRAVITREAL

## 2024-04-29 IMAGING — DX DG CHEST 2V
2 series · 2 of 2 positions shown · non-contrast
Comparison: 03/23/2018

CLINICAL DATA: Mixed-type COPD

EXAM:
CHEST - 2 VIEW

[chest pa]
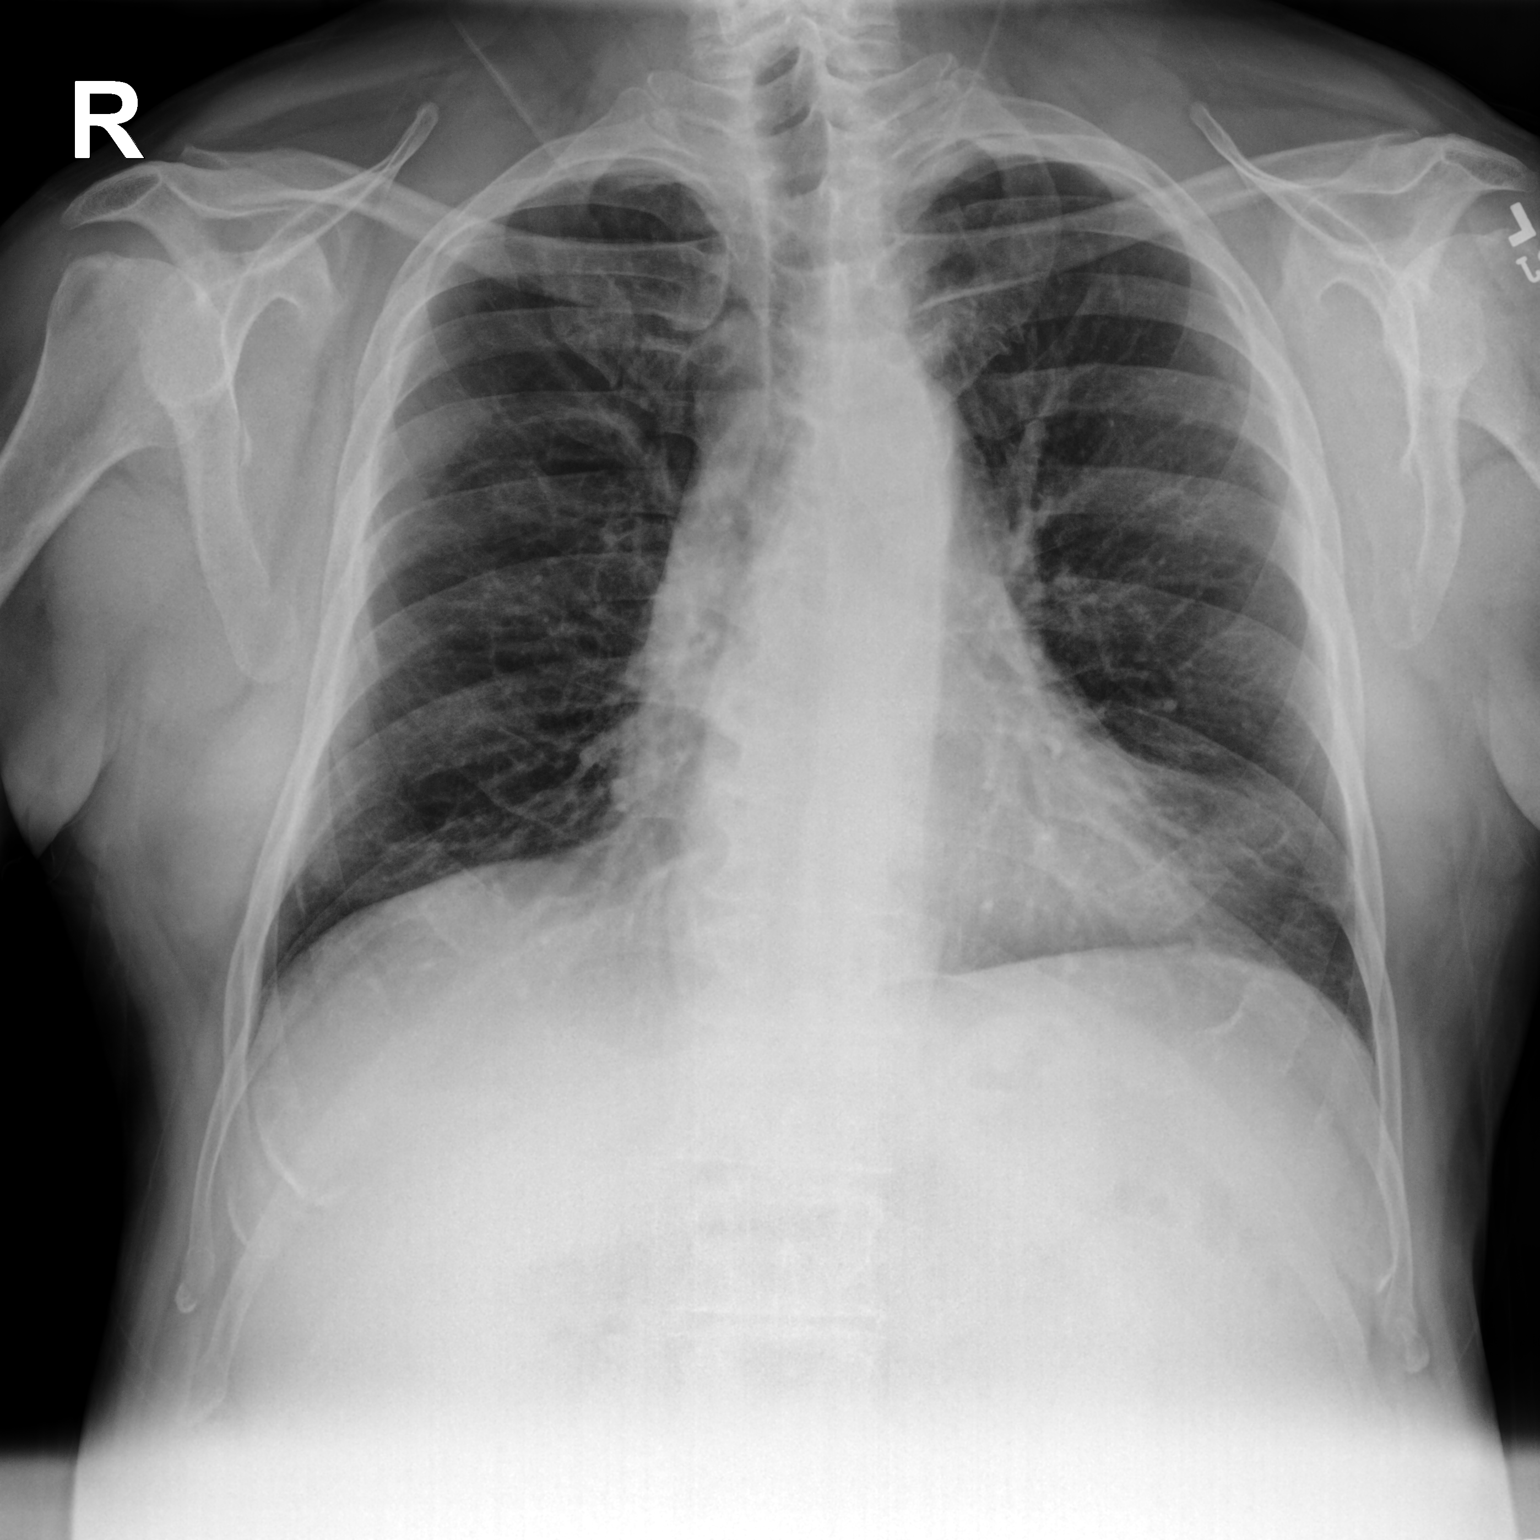

[chest lat]
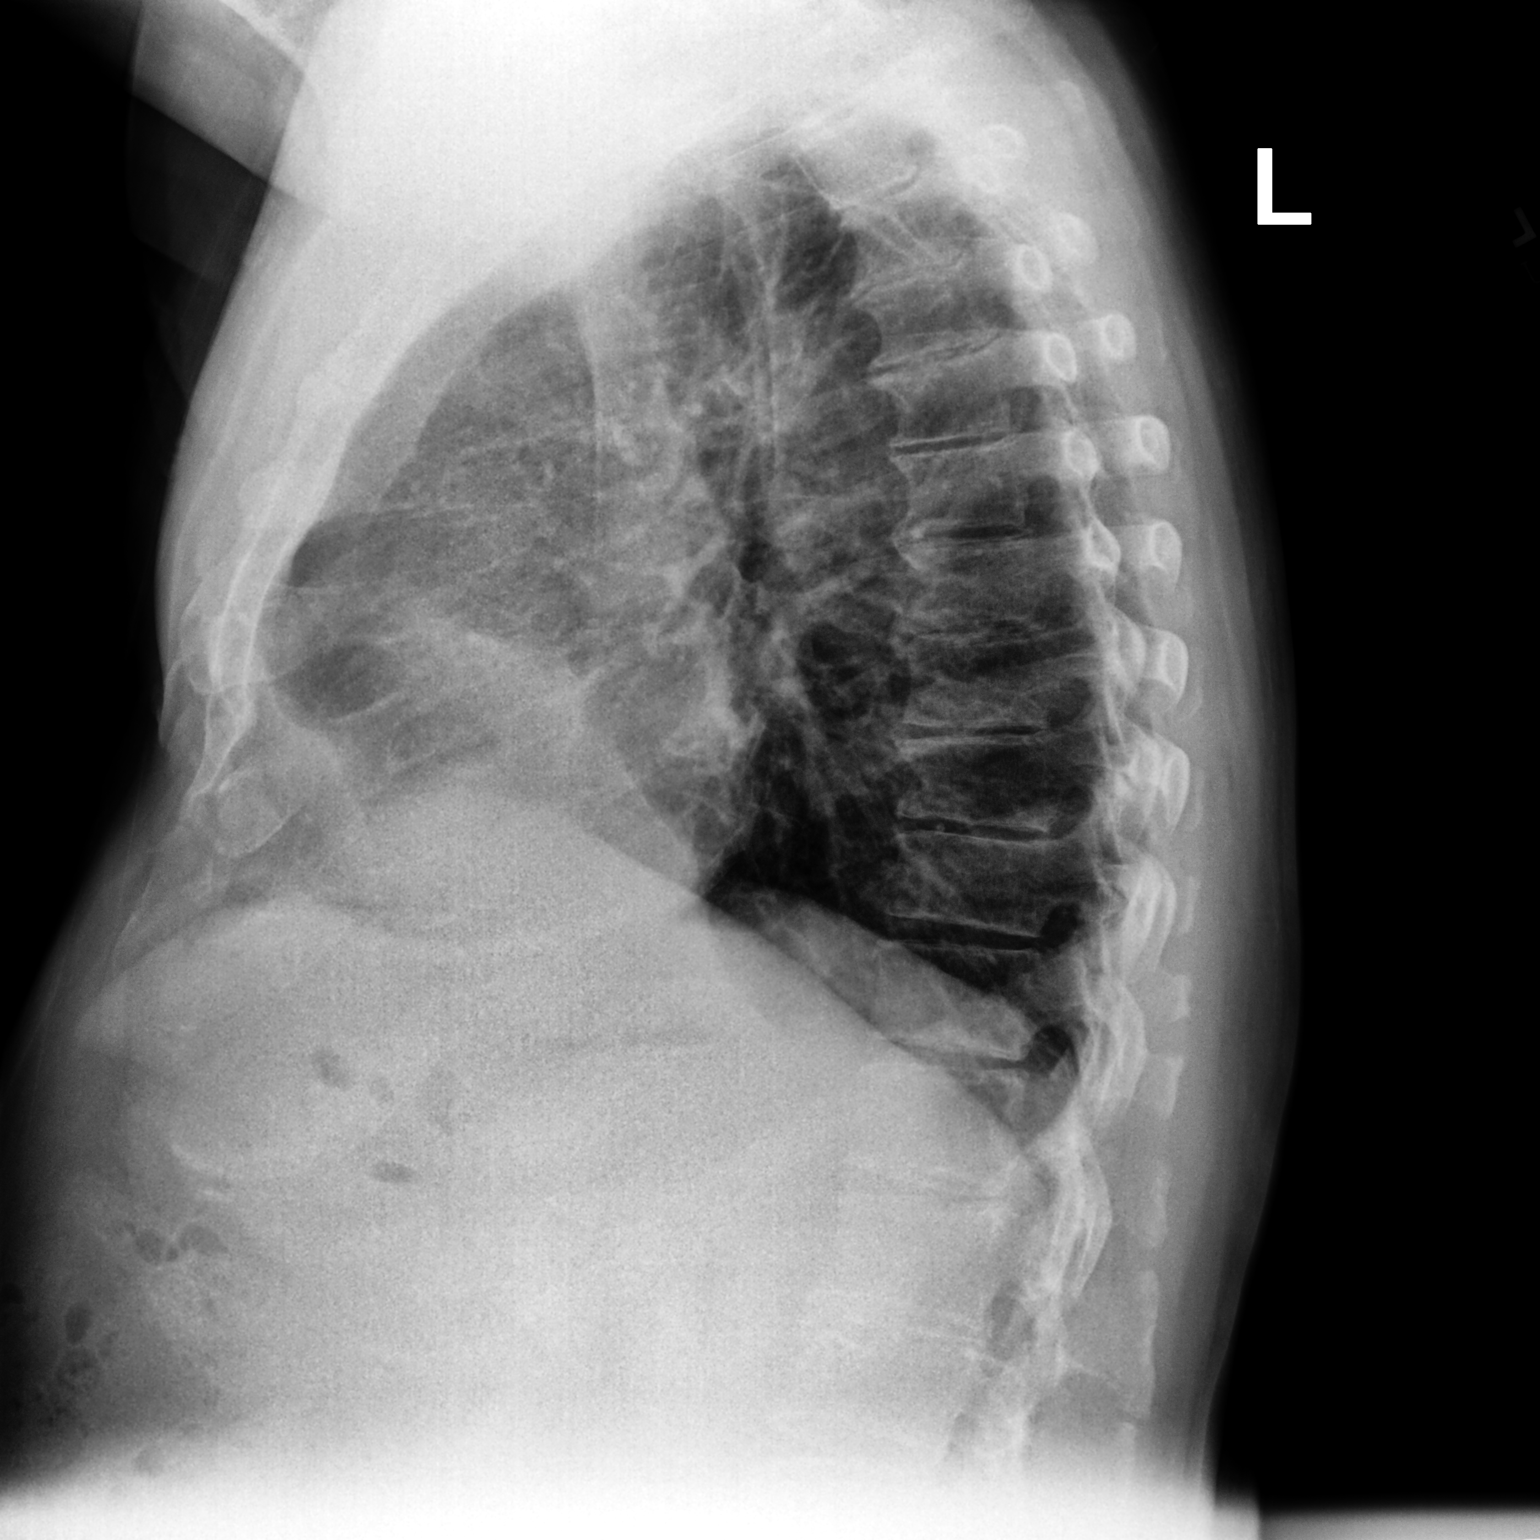

[2 of 2 positions shown; findings below may reference images not displayed]

FINDINGS: Stable mild interstitial prominence and scattered areas of
pleuroparenchymal scarring compatible with underlying
COPD/emphysema. Stable heart size and vascularity. Negative for
pneumonia, collapse or consolidation. No effusion or pneumothorax.
Trachea midline. Degenerative changes scoliosis of the spine.
IMPRESSION: Stable chronic COPD/emphysema pattern. No interval change or
superimposed acute process by plain radiography.

## 2024-05-01 ENCOUNTER — Ambulatory Visit: Payer: Commercial Managed Care - PPO | Admitting: Internal Medicine

## 2024-05-01 NOTE — Progress Notes (Signed)
 HPI male never smoker followed for chronic obstructive asthma ( a1AT MM, history of pneumonia at age 63 or 84 months) , allergic conjunctivitis, allergic rhinitis a1AT 09/05/10- MM wnl Office spirometry 08/24/2013-moderately severe obstruction. FVC 3.64/77%, FEV1 2.13/57%, FEV1/FVC 0.59, FEF 25-75% 1.23/33% FENO 10/27/16-  34 H PFT 04/11/18- Moderately severe obstruction, no resp to BD, no restriction, NL DLCO   FEV1/FVC 0.60  ----------------------------------------------------------------------------------------------------     12/25/22- 63 year old male never smoker followed for Chronic Obstructive Asthma ( a1AT MM, history of pneumonia at age 80 or 41 months ), allergic conjunctivitis, allergic rhinitis, CAD, complicated by IBS, BPH, Hyperlipidemia, PMR, -Trelegy 100, pro-air HFA, Flonase , zyrtec ,  ACT 25 -----Breathing has been good  He reports doing very well.  Breathing has been great with no acute problems. He is off of prednisone  which was taken for about 14 months under rheumatology direction for PMR.  He is now on observation status. He complains about restless sleep estimating only averages 3 to 4 hours a night.  Difficulty initiating and maintaining sleep.  Told by his wife that he snores at least at times.  Tired during the day.  He does not feel he has time to nap.  Melatonin helps some.  Also tried Tylenol PM.  Given history of snoring with no ENT surgery, we discussed doing a home sleep test to exclude OSA and he agreed. C XR 05/24/22-  IMPRESSION: No active cardiopulmonary disease.   05/02/24- 63 year old male never smoker followed for Chronic Obstructive Asthma ( a1AT MM, history of pneumonia at age 8 or 63 months ), allergic conjunctivitis, allergic rhinitis, CAD, complicated by IBS, BPH, Hyperlipidemia, PMR/ Rheumatology, -Trelegy 100, pro-air HFA, Flonase , zyrtec , Discussed the use of AI scribe software for clinical note transcription with the patient, who gave verbal  consent to proceed.  History of Present Illness   Justin Pennington is a 63 year old male who presents with a persistent cough since August.  This time cough began after a trip to the [facility] and [place]  in August, initially presenting with symptoms similar to bronchitis, including severe cough and head congestion. Treatment included amoxicillin  for 8 to 10 days and a week of steroids, which provided some relief.  Currently, he experiences a nagging, mostly productive cough with yellowish sputum and mild nasal congestion. He uses Trelegy daily for asthma management and has a rescue inhaler, though he is unsure if he needs refills. No headache but suspects sinus infection residual.    Assessment and Plan:    Chronic productive cough with possible chronic sinusitis Persistent productive cough with yellowish sputum and nasal congestion. Previous amoxicillin  and steroids provided temporary relief. Lungs clear, suggesting sinus-related issue. Coccidioidomycosis unlikely. - Prescribed doxycycline for potential sinus infection.  Chronic obstructive pulmonary disease (COPD) COPD managed with Trelegy and rescue inhaler. No exacerbation, breathing well-managed. - Continue Trelegy daily. - Ensure rescue inhaler availability.    Flu vax  ROS-see HPI    + = positive Constitutional:   No-   weight loss, night sweats, fevers, chills, fatigue, lassitude. HEENT:   No-  headaches, difficulty swallowing, tooth/dental problems, sore throat,       No-  sneezing, itching, ear ache, nasal congestion, post nasal drip,  CV:  No-   chest pain, orthopnea, PND, swelling in lower extremities, anasarca,  dizziness, palpitations Resp: +shortness of breath with exertion or at rest.              No-   productive cough,  No non-productive cough,  No- coughing up of blood.              No-   change in color of mucus.  + wheezing.   Skin: No-   rash or lesions. GI:  No-   heartburn, indigestion, abdominal pain,  nausea, GU: . MS:  No-   joint pain or swelling.  Neuro-     nothing unusual Psych:  No- change in mood or affect. No depression or anxiety.  No memory loss.  OBJ- Physical Exam General- Alert, Oriented, Affect-appropriate, Distress- none acute,  Skin- rash-none, lesions- none, excoriation- none Lymphadenopathy- none Head- atraumatic            Eyes- Gross vision intact, PERRLA, conjunctivae and secretions clear            Ears- Hearing, canals-normal            Nose- Clear, no-Septal dev, mucus, polyps, erosion, perforation.             Throat- Mallampati II , mucosa clear , drainage- none, tonsils- atrophic,  Neck- flexible , trachea midline, no stridor , thyroid nl, carotid no bruit Chest - symmetrical excursion , unlabored           Heart/CV- RRR , no murmur , no gallop  , no rub, nl s1 s2                           - JVD- none , edema- none, stasis changes- none, varices- none           Lung- clear to P&A/+ diminished, wheeze- none, cough- none , dullness-none, rub- none           Chest wall-  Abd-  Br/ Gen/ Rectal- Not done, not indicated Extrem- cyanosis- none, clubbing, none, atrophy- none, strength- nl Neuro- grossly intact to observation

## 2024-05-02 ENCOUNTER — Encounter: Payer: Self-pay | Admitting: Internal Medicine

## 2024-05-02 ENCOUNTER — Ambulatory Visit: Admitting: Internal Medicine

## 2024-05-02 VITALS — BP 160/84 | HR 68 | Temp 98.7°F | Ht 70.0 in | Wt 188.0 lb

## 2024-05-02 DIAGNOSIS — Z23 Encounter for immunization: Secondary | ICD-10-CM | POA: Diagnosis not present

## 2024-05-02 DIAGNOSIS — J45909 Unspecified asthma, uncomplicated: Secondary | ICD-10-CM | POA: Diagnosis not present

## 2024-05-02 MED ORDER — DOXYCYCLINE HYCLATE 100 MG PO TABS
100.0000 mg | ORAL_TABLET | Freq: Two times a day (BID) | ORAL | 0 refills | Status: AC
Start: 2024-05-02 — End: ?

## 2024-05-02 NOTE — Patient Instructions (Signed)
 Order flu vax standard  Script sent for doxycycline antibiotic for sinus infection  Ok to call for refills of your inhalers as needed

## 2024-05-06 ENCOUNTER — Encounter: Payer: Self-pay | Admitting: Internal Medicine

## 2024-05-15 NOTE — Progress Notes (Signed)
 [facility] - Clinic Note  05/24/2024   CHIEF COMPLAINT Patient presents for Retina Follow Up  HISTORY OF PRESENT ILLNESS: Justin Pennington is a 63 y.o. male who presents to the clinic today for:  HPI     Retina Follow Up   Patient presents with  Wet AMD.  In left eye.  This started 4 weeks ago.  I, the attending physician,  performed the HPI with the patient and updated documentation appropriately.        Comments   Patient here for 4 weeks retina follow up for exu ARMD OS. Patient states vision con't tell a lot of difference. No worse. Eyes been watering. No eye pain.       Last edited by [name], MD on 05/24/2024 12:47 PM.     Pt states he can not tell if the vision has improved but tested better on eye chart.   Referring physician: [name], MD [address] [place],  [place] [address]  HISTORICAL INFORMATION:  Selected notes from the MEDICAL RECORD NUMBER Referred by Dr. [name] for ex ARMD OS LEE:  Ocular Hx- PMH-   CURRENT MEDICATIONS: No current outpatient medications on file. (Ophthalmic Drugs)   No current facility-administered medications for this visit. (Ophthalmic Drugs)   Current Outpatient Medications (Other)  Medication Sig   cetirizine  (ZYRTEC ) 10 MG tablet Take 1 tablet (10 mg total) by mouth daily.   doxycycline (VIBRA-TABS) 100 MG tablet Take 1 tablet (100 mg total) by mouth 2 (two) times daily.   Evolocumab  (REPATHA  SURECLICK) 140 MG/ML SOAJ Inject 1 mL into the skin every 14 (fourteen) days.   finasteride (PROPECIA) 1 MG tablet Take 1 mg by mouth daily.   levalbuterol  (XOPENEX  HFA) 45 MCG/ACT inhaler Inhale 2 puffs every 6 hours as needed   TRELEGY ELLIPTA  100-62.5-25 MCG/ACT AEPB INHALE 1 PUFF BY MOUTH EVERY DAY   No current facility-administered medications for this visit. (Other)   REVIEW OF SYSTEMS: ROS   Positive for: Eyes Last edited by Orval Asberry GORMAN, COA on 05/24/2024  7:38 AM.       ALLERGIES Allergies  Allergen Reactions   Crestor [Rosuvastatin]     Joint stiffness   Lipitor [Atorvastatin  Calcium ] Other (See Comments)    Joint stiffness   Zetia [Ezetimibe] Other (See Comments)    Muscle aches   PAST MEDICAL HISTORY Past Medical History:  Diagnosis Date   Allergic conjunctivitis    Chronic obstructive asthma, unspecified    History of adenomatous polyp of colon 2005   Less than 1 cm   Hyperlipidemia    IBS (irritable bowel syndrome)    Diarrhea predominant   Insomnia, unspecified    Past Surgical History:  Procedure Laterality Date   COLONOSCOPY     GINGIVAL GRAFT     LEFT MENISCUS TEAR -ARTHROSCOPIC REPAIR     FAMILY HISTORY Family History  Problem Relation Age of Onset   Diabetes Mother        borderline diabetes   Cancer Father        BLADDER   Asthma Brother    Colon cancer Maternal Aunt 60   Stomach cancer Neg Hx    Colon polyps Neg Hx    Esophageal cancer Neg Hx    Rectal cancer Neg Hx    SOCIAL HISTORY Social History   Tobacco Use   Smoking status: Never    Passive exposure: Never   Smokeless tobacco: Never  Vaping Use   Vaping status: Never Used  Substance Use Topics   Alcohol use: Yes    Comment: occasional   Drug use: No       OPHTHALMIC EXAM:  Base Eye Exam     Visual Acuity (Snellen - Linear)       Right Left   Dist [place] 20/20 -2 20/70   Dist ph [place]  20/40 -2         Tonometry (Tonopen, 7:36 AM)       Right Left   Pressure 18 14         Pupils       Dark Light Shape React APD   Right 3 2 Round Brisk None   Left 3 2 Round Brisk None         Visual Fields (Counting fingers)       Left Right    Full Full         Extraocular Movement       Right Left    Full, Ortho Full, Ortho         Neuro/Psych     Oriented x3: Yes   Mood/Affect: Normal         Dilation     Both eyes: 1.0% Mydriacyl, 2.5% Phenylephrine @ 7:36 AM           Slit Lamp and Fundus Exam      External Exam       Right Left   External Normal Normal         Slit Lamp Exam       Right Left   Lids/Lashes Dermatochalasis - upper lid Dermatochalasis - upper lid   Conjunctiva/Sclera White and quiet White and quiet   Cornea Arcus, focal corneal haze 0400 Arcus   Anterior Chamber Deep and clear Deep and clear   Iris Round and dilated Round and dilated   Lens 2-3+ Nuclear sclerosis, 2+ Cortical cataract 2-3+ Nuclear sclerosis, 2+ Cortical cataract   Anterior Vitreous Vitreous syneresis Normal         Fundus Exam       Right Left   Disc Pink and sharp Pink and sharp   C/D Ratio 0.2 0.2   Macula Flat, good foveal reflex, Drusen, RPE mottling and clumping, No heme or edema Blunted foveal reflex, central CNV with edema, no heme, fine drusen, early subretinal fibrosis   Vessels attenuated, mild tortuosity attenuated, mild tortuosity   Periphery Attached, No heme Attached, No heme           IMAGING AND PROCEDURES  Imaging and Procedures for 05/24/2024  OCT, Retina - OU - Both Eyes       Right Eye Quality was good. Central Foveal Thickness: 309. Progression has been stable. Findings include normal foveal contour, no IRF, no SRF, retinal drusen , pigment epithelial detachment, vitreomacular adhesion .   Left Eye Quality was good. Central Foveal Thickness: 308. Progression has improved. Findings include abnormal foveal contour, subretinal hyper-reflective material, choroidal neovascular membrane, pigment epithelial detachment, vitreomacular adhesion (Mild interval improvement in CNV/low PED w/ stable improvement in IRF and SRF on nasal portion--trace residual fluid).   Notes *Images captured and stored on drive  Diagnosis / Impression:  OD: Non Ex. AMD - retinal drusen OS: ex ARMD - Mild interval improvement in CNV/low PED w/ stable improvement in IRF and SRF on nasal portion--trace residual fluid  Clinical management:  See below  Abbreviations: NFP - Normal foveal  profile. CME -  cystoid macular edema. PED - pigment epithelial detachment. IRF - intraretinal fluid. SRF - subretinal fluid. EZ - ellipsoid zone. ERM - epiretinal membrane. ORA - outer retinal atrophy. ORT - outer retinal tubulation. SRHM - subretinal hyper-reflective material. IRHM - intraretinal hyper-reflective material      Intravitreal Injection, Pharmacologic Agent - OS - Left Eye       Time Out 05/24/2024. 7:47 AM. Confirmed correct patient, procedure, site, and patient consented.   Anesthesia Topical anesthesia was used. Anesthetic medications included Lidocaine 2%, Proparacaine 0.5%.   Procedure Preparation included 5% betadine to ocular surface, eyelid speculum. A supplied (32g) needle was used.   Injection: 6 mg faricimab -svoa 6 MG/0.05ML   Route: Intravitreal, Site: Left Eye   NDC: 49757-903-93, Lot: [id number], Expiration date: 04/04/2025, Waste: 0 mL   Post-op Post injection exam found visual acuity of at least counting fingers. The patient tolerated the procedure well. There were no complications. The patient received written and verbal post procedure care education.            ASSESSMENT/PLAN:   ICD-10-CM   1. Exudative age-related macular degeneration of left eye with active choroidal neovascularization (HCC)  H35.3221 OCT, Retina - OU - Both Eyes    Intravitreal Injection, Pharmacologic Agent - OS - Left Eye    faricimab -svoa (VABYSMO ) 6mg /0.46mL intravitreal injection    2. 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, OS - s/p IVA OS #1 (01.02.25), #2 (01.30.25), #3 (02.27.25) -- IVA resistance ========= - s/p IVE OS #1 (03.27.25), #2 (04.24.25), #3 (05.22.25) -- IVE resistance ========= - s/p IVV OS #1 (06.23.25), #2 (07.24.25), #3 (08.27.25), #4 (09.24.25), #5 (10.22.25) - pt reports 1 mo history of  blurred vision and distortion, affecting his golfing - BCVA OS 20/40 - stable - OCT OS shows: Mild interval improvement in CNV/low PED w/ stable improvement in IRF and SRF on nasal portion--trace residual fluid at 4 weeks   - recommend IVV OS #6 today 11.19.25 w/ f/u in 4 wks - pt wishes to be treated with IVV - RBA of procedure discussed, questions answered - IVV informed consent obtained and signed 06.23.25 - see procedure note - Eylea  and Vabysmo  approved for one eye only -- insurance paying 100%  - f/u in 4 wks -- DFE/OCT, possible injection   2. Age related macular degeneration, non-exudative, OD - The incidence, anatomy, and pathology of dry AMD, risk of progression, and the AREDS and AREDS 2 studies including smoking risks discussed with patient.  - recommend Amsler grid monitoring  3.  Mixed Cataract OU - The symptoms of cataract, surgical options, and treatments and risks were discussed with patient. - discussed diagnosis and progression - under the expert care of [facility] - monitor  Ophthalmic Meds Ordered this visit:  Meds ordered this encounter  Medications   faricimab -svoa (VABYSMO ) 6mg /0.81mL intravitreal injection     Return in about 4 weeks (around 06/21/2024) for f/u, Ex. AMD, DFE, OCT, Possible, IVV, OS.  There are no Patient Instructions on file for this visit.  Explained the diagnoses, plan, and follow up with the patient and they expressed understanding.  Patient expressed understanding of the importance of proper follow up care.   This document serves as a record of services personally performed by Redell JUDITHANN Hans, MD, PhD. It was created on their  behalf by Almetta Pesa, an ophthalmic technician. The creation of this record is the provider's dictation and/or activities during the visit.    Electronically signed by: Almetta Pesa, OA, 05/24/24  12:47 PM  This document serves as a record of services personally performed by Redell JUDITHANN Hans, MD,  PhD. It was created on their behalf by Wanda GEANNIE Keens, COT an ophthalmic technician. The creation of this record is the provider's dictation and/or activities during the visit.    Electronically signed by:  Wanda GEANNIE Keens, COT  05/24/24 12:47 PM  Redell JUDITHANN Hans, M.D., Ph.D. Diseases & Surgery of the Retina and Vitreous [facility]  I have reviewed the above documentation for accuracy and completeness, and I agree with the above. Redell JUDITHANN Hans, M.D., Ph.D. 05/24/24 12:48 PM   Abbreviations: M myopia (nearsighted); A astigmatism; H hyperopia (farsighted); P presbyopia; Mrx spectacle prescription;  CTL contact lenses; OD right eye; OS left eye; OU both eyes  XT exotropia; ET esotropia; PEK punctate epithelial keratitis; PEE punctate epithelial erosions; DES dry eye syndrome; MGD meibomian gland dysfunction; ATs artificial tears; PFAT's preservative free artificial tears; NSC nuclear sclerotic cataract; PSC posterior subcapsular cataract; ERM epi-retinal membrane; PVD posterior vitreous detachment; RD retinal detachment; DM diabetes mellitus; DR diabetic retinopathy; NPDR non-proliferative diabetic retinopathy; PDR proliferative diabetic retinopathy; CSME clinically significant macular edema; DME diabetic macular edema; dbh dot blot hemorrhages; CWS cotton wool spot; POAG primary open angle glaucoma; C/D cup-to-disc ratio; HVF humphrey visual field; GVF goldmann visual field; OCT optical coherence tomography; IOP intraocular pressure; BRVO Branch retinal vein occlusion; CRVO central retinal vein occlusion; CRAO central retinal artery occlusion; BRAO branch retinal artery occlusion; RT retinal tear; SB scleral buckle; PPV pars plana vitrectomy; VH Vitreous hemorrhage; PRP panretinal laser photocoagulation; IVK intravitreal kenalog; VMT vitreomacular traction; MH Macular hole;  NVD neovascularization of the disc; NVE neovascularization elsewhere; AREDS age related eye disease  study; ARMD age related macular degeneration; POAG primary open angle glaucoma; EBMD epithelial/anterior basement membrane dystrophy; ACIOL anterior chamber intraocular lens; IOL intraocular lens; PCIOL posterior chamber intraocular lens; Phaco/IOL phacoemulsification with intraocular lens placement; PRK photorefractive keratectomy; LASIK laser assisted in situ keratomileusis; HTN hypertension; DM diabetes mellitus; COPD chronic obstructive pulmonary disease

## 2024-05-24 ENCOUNTER — Ambulatory Visit (INDEPENDENT_AMBULATORY_CARE_PROVIDER_SITE_OTHER): Admitting: Ophthalmology

## 2024-05-24 ENCOUNTER — Encounter (INDEPENDENT_AMBULATORY_CARE_PROVIDER_SITE_OTHER): Payer: Self-pay | Admitting: Ophthalmology

## 2024-05-24 DIAGNOSIS — H353111 Nonexudative age-related macular degeneration, right eye, early dry stage: Secondary | ICD-10-CM

## 2024-05-24 DIAGNOSIS — H25013 Cortical age-related cataract, bilateral: Secondary | ICD-10-CM

## 2024-05-24 DIAGNOSIS — H353221 Exudative age-related macular degeneration, left eye, with active choroidal neovascularization: Secondary | ICD-10-CM

## 2024-05-24 MED ORDER — FARICIMAB-SVOA 6 MG/0.05ML IZ SOSY
6.0000 mg | PREFILLED_SYRINGE | INTRAVITREAL | Status: AC | PRN
  Administered 2024-05-24: 6 mg via INTRAVITREAL

## 2024-06-02 ENCOUNTER — Other Ambulatory Visit: Payer: Self-pay | Admitting: Internal Medicine

## 2024-06-05 NOTE — Telephone Encounter (Signed)
 Refill request from pharmacy for Trelegy 100 mcg.  Last OV  Dr. Neysa on 05/02/2024.  Per chart note:  - Continue Trelegy daily.  Return in about 1 year (around 05/02/2025).   Will okay refill 90 days supply with 3 refills.

## 2024-06-15 NOTE — Progress Notes (Signed)
 [facility] - Clinic Note  06/21/2024   CHIEF COMPLAINT Patient presents for Retina Follow Up  HISTORY OF PRESENT ILLNESS: Justin Pennington is a 63 y.o. male who presents to the clinic today for:  HPI     Retina Follow Up   Patient presents with  Wet AMD.  In left eye.  Severity is moderate.  Duration of 4 weeks.  Since onset it is stable.        Comments   Patient states vision improved some OS.       Last edited by Verneda Auston BIRCH, COT on 06/21/2024  7:43 AM.     Patient feels the vision is the same. He feels the eyes are dry.  Referring physician: [name], MD [address] [place],  [place] [address]  HISTORICAL INFORMATION:  Selected notes from the MEDICAL RECORD NUMBER Referred by Dr. [name] for ex ARMD OS LEE:  Ocular Hx- PMH-   CURRENT MEDICATIONS: No current outpatient medications on file. (Ophthalmic Drugs)   No current facility-administered medications for this visit. (Ophthalmic Drugs)   Current Outpatient Medications (Other)  Medication Sig   cetirizine  (ZYRTEC ) 10 MG tablet Take 1 tablet (10 mg total) by mouth daily.   doxycycline  (VIBRA -TABS) 100 MG tablet Take 1 tablet (100 mg total) by mouth 2 (two) times daily.   Evolocumab  (REPATHA  SURECLICK) 140 MG/ML SOAJ Inject 1 mL into the skin every 14 (fourteen) days.   finasteride (PROPECIA) 1 MG tablet Take 1 mg by mouth daily.   Fluticasone -Umeclidin-Vilant (TRELEGY ELLIPTA ) 100-62.5-25 MCG/ACT AEPB INHALE 1 PUFF BY MOUTH EVERY DAY   levalbuterol  (XOPENEX  HFA) 45 MCG/ACT inhaler Inhale 2 puffs every 6 hours as needed   No current facility-administered medications for this visit. (Other)   REVIEW OF SYSTEMS: ROS   Positive for: Eyes Negative for: Constitutional, Gastrointestinal, Neurological, Skin, Genitourinary, Musculoskeletal, HENT, Endocrine, Cardiovascular, Respiratory, Psychiatric, Allergic/Imm, Heme/Lymph Last edited by Verneda Auston BIRCH, COT on  06/21/2024  7:41 AM.       ALLERGIES Allergies  Allergen Reactions   Crestor [Rosuvastatin]     Joint stiffness   Lipitor [Atorvastatin  Calcium ] Other (See Comments)    Joint stiffness   Zetia [Ezetimibe] Other (See Comments)    Muscle aches   PAST MEDICAL HISTORY Past Medical History:  Diagnosis Date   Allergic conjunctivitis    Chronic obstructive asthma, unspecified    History of adenomatous polyp of colon 2005   Less than 1 cm   Hyperlipidemia    IBS (irritable bowel syndrome)    Diarrhea predominant   Insomnia, unspecified    Past Surgical History:  Procedure Laterality Date   COLONOSCOPY     GINGIVAL GRAFT     LEFT MENISCUS TEAR -ARTHROSCOPIC REPAIR     FAMILY HISTORY Family History  Problem Relation Age of Onset   Diabetes Mother        borderline diabetes   Cancer Father        BLADDER   Asthma Brother    Colon cancer Maternal Aunt 49   Stomach cancer Neg Hx    Colon polyps Neg Hx    Esophageal cancer Neg Hx    Rectal cancer Neg Hx    SOCIAL HISTORY Social History   Tobacco Use   Smoking status: Never    Passive exposure: Never   Smokeless tobacco: Never  Vaping Use   Vaping status: Never Used  Substance Use Topics   Alcohol  use: Yes    Comment: occasional   Drug use: No       OPHTHALMIC EXAM:  Base Eye Exam     Visual Acuity (Snellen - Linear)       Right Left   Dist [place] 20/25 -2 20/60 -2   Dist ph [place] 20/25 20/40 -2         Tonometry (Tonopen, 7:47 AM)       Right Left   Pressure 16 14         Pupils       Dark Light Shape React APD   Right 3 2 Round Brisk None   Left 3 2 Round Brisk None         Visual Fields (Counting fingers)       Left Right    Full Full         Extraocular Movement       Right Left    Full, Ortho Full, Ortho         Neuro/Psych     Oriented x3: Yes   Mood/Affect: Normal         Dilation     Both eyes: 1.0% Mydriacyl, 2.5% Phenylephrine @ 7:47 AM           Slit  Lamp and Fundus Exam     External Exam       Right Left   External Normal Normal         Slit Lamp Exam       Right Left   Lids/Lashes Dermatochalasis - upper lid Dermatochalasis - upper lid   Conjunctiva/Sclera White and quiet White and quiet   Cornea Arcus, focal corneal haze 0400 Arcus   Anterior Chamber Deep and clear Deep and clear   Iris Round and dilated Round and dilated   Lens 2-3+ Nuclear sclerosis, 2+ Cortical cataract 2-3+ Nuclear sclerosis, 2+ Cortical cataract   Anterior Vitreous Vitreous syneresis Normal         Fundus Exam       Right Left   Disc Pink and sharp Pink and sharp   C/D Ratio 0.2 0.2   Macula Flat, good foveal reflex, Drusen, RPE mottling and clumping, No heme or edema Blunted foveal reflex, central CNV with edema, no heme, fine drusen, early subretinal fibrosis   Vessels attenuated, mild tortuosity attenuated, mild tortuosity   Periphery Attached, No heme Attached, No heme           IMAGING AND PROCEDURES  Imaging and Procedures for 06/21/2024  OCT, Retina - OU - Both Eyes       Right Eye Quality was good. Central Foveal Thickness: 309. Progression has been stable. Findings include normal foveal contour, no IRF, no SRF, retinal drusen , pigment epithelial detachment, vitreomacular adhesion .   Left Eye Quality was good. Central Foveal Thickness: 302. Progression has improved. Findings include abnormal foveal contour, subretinal hyper-reflective material, choroidal neovascular membrane, pigment epithelial detachment, vitreomacular adhesion (Mild interval improvement in CNV/low PED w/ stable improvement in IRF and SRF on nasal portion--trace residual fluid).   Notes *Images captured and stored on drive  Diagnosis / Impression:  OD: Non Ex. AMD - retinal drusen OS: ex ARMD - Mild interval improvement in CNV/low PED w/ stable improvement in IRF and SRF on nasal portion--trace residual fluid  Clinical management:  See  below  Abbreviations: NFP - Normal foveal profile. CME - cystoid macular edema. PED - pigment epithelial detachment. IRF - intraretinal fluid. SRF -  subretinal fluid. EZ - ellipsoid zone. ERM - epiretinal membrane. ORA - outer retinal atrophy. ORT - outer retinal tubulation. SRHM - subretinal hyper-reflective material. IRHM - intraretinal hyper-reflective material      Intravitreal Injection, Pharmacologic Agent - OS - Left Eye       Time Out 06/21/2024. 8:07 AM. Confirmed correct patient, procedure, site, and patient consented.   Anesthesia Topical anesthesia was used. Anesthetic medications included Lidocaine 2%, Proparacaine 0.5%.   Procedure Preparation included 5% betadine to ocular surface, eyelid speculum. A supplied (32g) needle was used.   Injection: 6 mg faricimab -svoa 6 MG/0.05ML   Route: Intravitreal, Site: Left Eye   NDC: 9311266429, Waste: 0 mL   Post-op Post injection exam found visual acuity of at least counting fingers. The patient tolerated the procedure well. There were no complications. The patient received written and verbal post procedure care education.             ASSESSMENT/PLAN:   ICD-10-CM   1. Exudative age-related macular degeneration of left eye with active choroidal neovascularization (HCC)  H35.3221 OCT, Retina - OU - Both Eyes    Intravitreal Injection, Pharmacologic Agent - OS - Left Eye    2. 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, OS - s/p IVA OS #1 (01.02.25), #2 (01.30.25), #3 (02.27.25)  -- IVA resistance ========= - s/p IVE OS #1 (03.27.25), #2 (04.24.25), #3 (05.22.25)  -- IVE resistance ========= - s/p IVV OS #1 (06.23.25), #2 (07.24.25), #3 (08.27.25), #4 (09.24.25), #5 (10.22.25), #6 (11.19.25) - pt reports 1 mo history of blurred vision and distortion, affecting his  golfing - BCVA OS 20/40 - stable - OCT OS shows:  CNV/low PED w/ stable improvement in IRF and SRF -- just trace residual fluid at 4 weeks   - recommend IVV OS #7 today 12.17.25 w/ f/u in 4-5 wks - pt wishes to be treated with IVV - RBA of procedure discussed, questions answered - IVV informed consent obtained and signed 06.23.25 - see procedure note - Eylea  and Vabysmo  approved for one eye only -- insurance paying 100%  - f/u in 4-5 wks -- DFE/OCT, possible injection   2. Age related macular degeneration, non-exudative, OD - The incidence, anatomy, and pathology of dry AMD, risk of progression, and the AREDS and AREDS 2 studies including smoking risks discussed with patient.  - recommend Amsler grid monitoring  3.  Mixed Cataract OU - The symptoms of cataract, surgical options, and treatments and risks were discussed with patient. - discussed diagnosis and progression - under the expert care of [facility] - monitor  Ophthalmic Meds Ordered this visit:  No orders of the defined types were placed in this encounter.    Return in about 4 weeks (around 07/19/2024) for f/u, Ex. AMD, DFE, OCT, Possible, IVV, OS.  There are no Patient Instructions on file for this visit.  Explained the diagnoses, plan, and follow up with the patient and they expressed understanding.  Patient expressed understanding of the importance of proper follow up care.   This document serves as a record of services personally performed by Redell JUDITHANN Hans, MD, PhD. It was created on their behalf by Almetta Pesa, an ophthalmic technician. The creation of this record is the provider's dictation and/or activities during the visit.    Electronically signed by: Makenzie  Simpson, OA, 06/21/2024  8:20 AM  This document serves as a record of services personally performed by Redell JUDITHANN Hans, MD, PhD. It was created on their behalf by Wanda GEANNIE Keens, COT an ophthalmic technician. The creation of this record is the  provider's dictation and/or activities during the visit.    Electronically signed by:  Wanda GEANNIE Keens, COT  06/21/2024 8:20 AM  Redell JUDITHANN Hans, M.D., Ph.D. Diseases & Surgery of the Retina and Vitreous [facility]   Abbreviations: M myopia (nearsighted); A astigmatism; H hyperopia (farsighted); P presbyopia; Mrx spectacle prescription;  CTL contact lenses; OD right eye; OS left eye; OU both eyes  XT exotropia; ET esotropia; PEK punctate epithelial keratitis; PEE punctate epithelial erosions; DES dry eye syndrome; MGD meibomian gland dysfunction; ATs artificial tears; PFAT's preservative free artificial tears; NSC nuclear sclerotic cataract; PSC posterior subcapsular cataract; ERM epi-retinal membrane; PVD posterior vitreous detachment; RD retinal detachment; DM diabetes mellitus; DR diabetic retinopathy; NPDR non-proliferative diabetic retinopathy; PDR proliferative diabetic retinopathy; CSME clinically significant macular edema; DME diabetic macular edema; dbh dot blot hemorrhages; CWS cotton wool spot; POAG primary open angle glaucoma; C/D cup-to-disc ratio; HVF humphrey visual field; GVF goldmann visual field; OCT optical coherence tomography; IOP intraocular pressure; BRVO Branch retinal vein occlusion; CRVO central retinal vein occlusion; CRAO central retinal artery occlusion; BRAO branch retinal artery occlusion; RT retinal tear; SB scleral buckle; PPV pars plana vitrectomy; VH Vitreous hemorrhage; PRP panretinal laser photocoagulation; IVK intravitreal kenalog; VMT vitreomacular traction; MH Macular hole;  NVD neovascularization of the disc; NVE neovascularization elsewhere; AREDS age related eye disease study; ARMD age related macular degeneration; POAG primary open angle glaucoma; EBMD epithelial/anterior basement membrane dystrophy; ACIOL anterior chamber intraocular lens; IOL intraocular lens; PCIOL posterior chamber intraocular lens; Phaco/IOL phacoemulsification  with intraocular lens placement; PRK photorefractive keratectomy; LASIK laser assisted in situ keratomileusis; HTN hypertension; DM diabetes mellitus; COPD chronic obstructive pulmonary disease

## 2024-06-21 ENCOUNTER — Encounter (INDEPENDENT_AMBULATORY_CARE_PROVIDER_SITE_OTHER): Payer: Self-pay | Admitting: Ophthalmology

## 2024-06-21 ENCOUNTER — Ambulatory Visit (INDEPENDENT_AMBULATORY_CARE_PROVIDER_SITE_OTHER): Admitting: Ophthalmology

## 2024-06-21 DIAGNOSIS — H25013 Cortical age-related cataract, bilateral: Secondary | ICD-10-CM | POA: Diagnosis not present

## 2024-06-21 DIAGNOSIS — H353221 Exudative age-related macular degeneration, left eye, with active choroidal neovascularization: Secondary | ICD-10-CM | POA: Diagnosis not present

## 2024-06-21 DIAGNOSIS — H353111 Nonexudative age-related macular degeneration, right eye, early dry stage: Secondary | ICD-10-CM | POA: Diagnosis not present

## 2024-06-21 MED ORDER — FARICIMAB-SVOA 6 MG/0.05ML IZ SOSY
6.0000 mg | PREFILLED_SYRINGE | INTRAVITREAL | Status: AC | PRN
  Administered 2024-06-21: 17:00:00 6 mg via INTRAVITREAL

## 2024-07-14 NOTE — Progress Notes (Signed)
 " [facility] - Clinic Note  07/26/2024   CHIEF COMPLAINT Patient presents for Retina Follow Up  HISTORY OF PRESENT ILLNESS: Justin Pennington is a 64 y.o. male who presents to the clinic today for:  HPI     Retina Follow Up   Patient presents with  Wet AMD.  In left eye.  Severity is moderate.  Duration of 4 weeks.  Since onset it is stable.  I, the attending physician,  performed the HPI with the patient and updated documentation appropriately.        Comments   Pt states in a dark room he can notice a dark blob right in the middle of his vision on the left eye, he just noticed about 2-3 weeks ago and he is not sure what it is. Pt denies FOL/pain. Pt occasionally uses blink. Pt has an amsler grid but does not monitor. Pt is taking ARED regularly. Pt is going to [facility] in April to evaluate cataract developement.      Last edited by [name], MD on 07/26/2024 12:01 PM.    Patient feels the vision is about the same.  He notices in a dark room he sees a jagged dark spot.   Referring physician: [name], MD [address] [place],  [place] [address]  HISTORICAL INFORMATION:  Selected notes from the MEDICAL RECORD NUMBER Referred by Dr. [name] for ex ARMD OS LEE:  Ocular Hx- PMH-   CURRENT MEDICATIONS: No current outpatient medications on file. (Ophthalmic Drugs)   No current facility-administered medications for this visit. (Ophthalmic Drugs)   Current Outpatient Medications (Other)  Medication Sig   cetirizine  (ZYRTEC ) 10 MG tablet Take 1 tablet (10 mg total) by mouth daily.   doxycycline  (VIBRA -TABS) 100 MG tablet Take 1 tablet (100 mg total) by mouth 2 (two) times daily.   Evolocumab  (REPATHA  SURECLICK) 140 MG/ML SOAJ Inject 1 mL into the skin every 14 (fourteen) days.   finasteride (PROPECIA) 1 MG tablet Take 1 mg by mouth daily.   Fluticasone -Umeclidin-Vilant (TRELEGY ELLIPTA ) 100-62.5-25 MCG/ACT AEPB INHALE 1 PUFF BY MOUTH EVERY  DAY   levalbuterol  (XOPENEX  HFA) 45 MCG/ACT inhaler Inhale 2 puffs every 6 hours as needed   No current facility-administered medications for this visit. (Other)   REVIEW OF SYSTEMS: ROS   Positive for: Eyes Negative for: Constitutional, Gastrointestinal, Neurological, Skin, Genitourinary, Musculoskeletal, HENT, Endocrine, Cardiovascular, Respiratory, Psychiatric, Allergic/Imm, Heme/Lymph Last edited by Elnor Avelina GORMAN, COT on 07/26/2024  7:54 AM.     ALLERGIES Allergies  Allergen Reactions   Crestor [Rosuvastatin]     Joint stiffness   Lipitor [Atorvastatin  Calcium ] Other (See Comments)    Joint stiffness   Zetia [Ezetimibe] Other (See Comments)    Muscle aches   PAST MEDICAL HISTORY Past Medical History:  Diagnosis Date   Allergic conjunctivitis    Chronic obstructive asthma, unspecified    History of adenomatous polyp of colon 2005   Less than 1 cm   Hyperlipidemia    IBS (irritable bowel syndrome)    Diarrhea predominant   Insomnia, unspecified    Past Surgical History:  Procedure Laterality Date   COLONOSCOPY     GINGIVAL GRAFT     LEFT MENISCUS TEAR -ARTHROSCOPIC REPAIR     FAMILY HISTORY Family History  Problem Relation Age of Onset   Diabetes Mother        borderline diabetes   Cancer Father  BLADDER   Asthma Brother    Colon cancer Maternal Aunt 60   Stomach cancer Neg Hx    Colon polyps Neg Hx    Esophageal cancer Neg Hx    Rectal cancer Neg Hx    SOCIAL HISTORY Social History   Tobacco Use   Smoking status: Never    Passive exposure: Never   Smokeless tobacco: Never  Vaping Use   Vaping status: Never Used  Substance Use Topics   Alcohol use: Yes    Comment: occasional   Drug use: No       OPHTHALMIC EXAM:  Base Eye Exam     Visual Acuity (Snellen - Linear)       Right Left   Dist [place] 20/20 -2 20/100 +2   Dist ph [place] -- 20/40         Tonometry (Tonopen, 8:03 AM)       Right Left   Pressure 18 19         Pupils        Pupils Dark Light Shape React APD   Right PERRL 3 2 Round Brisk None   Left PERRL 3 2 Round Brisk None         Visual Fields       Left Right    Full Full         Extraocular Movement       Right Left    Full, Ortho Full, Ortho         Neuro/Psych     Oriented x3: Yes   Mood/Affect: Normal         Dilation     Both eyes: 1.0% Mydriacyl, 2.5% Phenylephrine @ 8:04 AM           Slit Lamp and Fundus Exam     External Exam       Right Left   External Normal Normal         Slit Lamp Exam       Right Left   Lids/Lashes Dermatochalasis - upper lid Dermatochalasis - upper lid   Conjunctiva/Sclera White and quiet White and quiet   Cornea Arcus, focal corneal haze 0400 Arcus   Anterior Chamber Deep and clear Deep and clear   Iris Round and dilated Round and dilated   Lens 2-3+ Nuclear sclerosis, 2+ Cortical cataract 2-3+ Nuclear sclerosis, 2+ Cortical cataract   Anterior Vitreous Vitreous syneresis Normal         Fundus Exam       Right Left   Disc Pink and sharp Pink and sharp   C/D Ratio 0.2 0.2   Macula Flat, good foveal reflex, Drusen, RPE mottling and clumping, No heme or edema Blunted foveal reflex, central CNV with edema, no heme, fine drusen, early subretinal fibrosis   Vessels attenuated, mild tortuosity attenuated, mild tortuosity   Periphery Attached, No heme Attached, No heme           IMAGING AND PROCEDURES  Imaging and Procedures for 07/26/2024  OCT, Retina - OU - Both Eyes       Right Eye Quality was good. Central Foveal Thickness: 309. Progression has been stable. Findings include normal foveal contour, no IRF, no SRF, retinal drusen , pigment epithelial detachment, vitreomacular adhesion .   Left Eye Quality was good. Central Foveal Thickness: 306. Progression has worsened. Findings include abnormal foveal contour, subretinal hyper-reflective material, choroidal neovascular membrane, pigment epithelial detachment,  vitreomacular adhesion ( CNV/low PED w/ trace IRF and  SRF --slightly increased).   Notes *Images captured and stored on drive  Diagnosis / Impression:  OD: Non Ex. AMD - retinal drusen OS: ex ARMD -  CNV/low PED w/ trace IRF and SRF --slightly increased  Clinical management:  See below  Abbreviations: NFP - Normal foveal profile. CME - cystoid macular edema. PED - pigment epithelial detachment. IRF - intraretinal fluid. SRF - subretinal fluid. EZ - ellipsoid zone. ERM - epiretinal membrane. ORA - outer retinal atrophy. ORT - outer retinal tubulation. SRHM - subretinal hyper-reflective material. IRHM - intraretinal hyper-reflective material      Intravitreal Injection, Pharmacologic Agent - OS - Left Eye       Time Out 07/26/2024. 8:12 AM. Confirmed correct patient, procedure, site, and patient consented.   Anesthesia Topical anesthesia was used. Anesthetic medications included Lidocaine 2%, Proparacaine 0.5%.   Procedure Preparation included 5% betadine to ocular surface, eyelid speculum. A supplied (32g) needle was used.   Injection: 6 mg faricimab -svoa 6 MG/0.05ML   Route: Intravitreal, Site: Left Eye   NDC: 49757-903-93, Lot: [id number], Expiration date: 08/04/2025, Waste: 0 mL   Post-op Post injection exam found visual acuity of at least counting fingers. The patient tolerated the procedure well. There were no complications. The patient received written and verbal post procedure care education.           ASSESSMENT/PLAN:   ICD-10-CM   1. Exudative age-related macular degeneration of left eye with active choroidal neovascularization (HCC)  H35.3221 OCT, Retina - OU - Both Eyes    Intravitreal Injection, Pharmacologic Agent - OS - Left Eye    faricimab -svoa (VABYSMO ) 6mg /0.49mL intravitreal injection    2. 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 Exudative  age related macular degeneration, OS - pt reports 1 mo history of blurred vision and distortion, affecting his golfing - BCVA OS 20/40 - stable - ** hx of interval increase of IRF/SRF at 5 weeks 01.21.26** - s/p IVA OS #1 (01.02.25), #2 (01.30.25), #3 (02.27.25)  -- IVA resistance ========= - s/p IVE OS #1 (03.27.25), #2 (04.24.25), #3 (05.22.25)  -- IVE resistance ========= - s/p IVV OS #1 (06.23.25), #2 (07.24.25), #3 (08.27.25), #4 (09.24.25), #5 (10.22.25), #6 (11.19.25), #7 (12.17.25) - OCT OS shows: CNV/low PED w/ trace IRF and SRF --slightly increased at 5 weeks   - recommend IVV OS #8 today 01.21.26 w/ f/u back to 4 wks - pt wishes to be treated with IVV - RBA of procedure discussed, questions answered - IVV informed consent obtained and signed 06.23.25 - see procedure note - Eylea  and Vabysmo  approved for one eye only -- insurance paying 100%  - f/u in 4 wks -- DFE/OCT, possible injection   2. Age related macular degeneration, non-exudative, OD - The incidence, anatomy, and pathology of dry AMD, risk of progression, and the AREDS and AREDS 2 studies including smoking risks discussed with patient.  - recommend Amsler grid monitoring  3.  Mixed Cataract OU - The symptoms of cataract, surgical options, and treatments and risks were discussed with patient. - discussed diagnosis and progression - under the expert care of [facility] - monitor  Ophthalmic Meds Ordered this visit:  Meds ordered this encounter  Medications   faricimab -svoa (VABYSMO ) 6mg /0.30mL intravitreal injection     Return in about 4 weeks (around 08/23/2024) for f/u, Ex. AMD, DFE, OCT, Possible, IVV,  OS.  There are no Patient Instructions on file for this visit.  Explained the diagnoses, plan, and follow up with the patient and they expressed understanding.  Patient expressed understanding of the importance of proper follow up care.   This document serves as a record of services personally performed  by Redell JUDITHANN Hans, MD, PhD. It was created on their behalf by Almetta Pesa, an ophthalmic technician. The creation of this record is the provider's dictation and/or activities during the visit.    Electronically signed by: Almetta Pesa, OA, 07/26/24  12:11 PM  This document serves as a record of services personally performed by Redell JUDITHANN Hans, MD, PhD. It was created on their behalf by Wanda GEANNIE Keens, COT an ophthalmic technician. The creation of this record is the provider's dictation and/or activities during the visit.    Electronically signed by:  Wanda GEANNIE Keens, COT  07/26/24 12:11 PM  Redell JUDITHANN Hans, M.D., Ph.D. Diseases & Surgery of the Retina and Vitreous [facility]  I have reviewed the above documentation for accuracy and completeness, and I agree with the above. Redell JUDITHANN Hans, M.D., Ph.D. 07/26/24 12:12 PM   Abbreviations: M myopia (nearsighted); A astigmatism; H hyperopia (farsighted); P presbyopia; Mrx spectacle prescription;  CTL contact lenses; OD right eye; OS left eye; OU both eyes  XT exotropia; ET esotropia; PEK punctate epithelial keratitis; PEE punctate epithelial erosions; DES dry eye syndrome; MGD meibomian gland dysfunction; ATs artificial tears; PFAT's preservative free artificial tears; NSC nuclear sclerotic cataract; PSC posterior subcapsular cataract; ERM epi-retinal membrane; PVD posterior vitreous detachment; RD retinal detachment; DM diabetes mellitus; DR diabetic retinopathy; NPDR non-proliferative diabetic retinopathy; PDR proliferative diabetic retinopathy; CSME clinically significant macular edema; DME diabetic macular edema; dbh dot blot hemorrhages; CWS cotton wool spot; POAG primary open angle glaucoma; C/D cup-to-disc ratio; HVF humphrey visual field; GVF goldmann visual field; OCT optical coherence tomography; IOP intraocular pressure; BRVO Branch retinal vein occlusion; CRVO central retinal vein occlusion; CRAO  central retinal artery occlusion; BRAO branch retinal artery occlusion; RT retinal tear; SB scleral buckle; PPV pars plana vitrectomy; VH Vitreous hemorrhage; PRP panretinal laser photocoagulation; IVK intravitreal kenalog; VMT vitreomacular traction; MH Macular hole;  NVD neovascularization of the disc; NVE neovascularization elsewhere; AREDS age related eye disease study; ARMD age related macular degeneration; POAG primary open angle glaucoma; EBMD epithelial/anterior basement membrane dystrophy; ACIOL anterior chamber intraocular lens; IOL intraocular lens; PCIOL posterior chamber intraocular lens; Phaco/IOL phacoemulsification with intraocular lens placement; PRK photorefractive keratectomy; LASIK laser assisted in situ keratomileusis; HTN hypertension; DM diabetes mellitus; COPD chronic obstructive pulmonary disease  "

## 2024-07-26 ENCOUNTER — Encounter (INDEPENDENT_AMBULATORY_CARE_PROVIDER_SITE_OTHER): Payer: Self-pay | Admitting: Ophthalmology

## 2024-07-26 ENCOUNTER — Ambulatory Visit (INDEPENDENT_AMBULATORY_CARE_PROVIDER_SITE_OTHER): Admitting: Ophthalmology

## 2024-07-26 DIAGNOSIS — H353221 Exudative age-related macular degeneration, left eye, with active choroidal neovascularization: Secondary | ICD-10-CM

## 2024-07-26 DIAGNOSIS — H25013 Cortical age-related cataract, bilateral: Secondary | ICD-10-CM

## 2024-07-26 DIAGNOSIS — H353111 Nonexudative age-related macular degeneration, right eye, early dry stage: Secondary | ICD-10-CM

## 2024-07-26 MED ORDER — FARICIMAB-SVOA 6 MG/0.05ML IZ SOSY
6.0000 mg | PREFILLED_SYRINGE | INTRAVITREAL | Status: AC | PRN
  Administered 2024-07-26: 6 mg via INTRAVITREAL

## 2024-08-10 NOTE — Progress Notes (Shared)
 " [facility] - Clinic Note  08/23/2024   CHIEF COMPLAINT Patient presents for No chief complaint on file.  HISTORY OF PRESENT ILLNESS: Justin Pennington is a 64 y.o. male who presents to the clinic today for:   Patient feels the vision is about the same.  He notices in a dark room he sees a jagged dark spot.   Referring physician: [name], MD [address] [place],  [place] [address]  HISTORICAL INFORMATION:  Selected notes from the MEDICAL RECORD NUMBER Referred by Dr. [name] for ex ARMD OS LEE:  Ocular Hx- PMH-   CURRENT MEDICATIONS: No current outpatient medications on file. (Ophthalmic Drugs)   No current facility-administered medications for this visit. (Ophthalmic Drugs)   Current Outpatient Medications (Other)  Medication Sig   cetirizine  (ZYRTEC ) 10 MG tablet Take 1 tablet (10 mg total) by mouth daily.   doxycycline  (VIBRA -TABS) 100 MG tablet Take 1 tablet (100 mg total) by mouth 2 (two) times daily.   Evolocumab  (REPATHA  SURECLICK) 140 MG/ML SOAJ Inject 1 mL into the skin every 14 (fourteen) days.   finasteride (PROPECIA) 1 MG tablet Take 1 mg by mouth daily.   Fluticasone -Umeclidin-Vilant (TRELEGY ELLIPTA ) 100-62.5-25 MCG/ACT AEPB INHALE 1 PUFF BY MOUTH EVERY DAY   levalbuterol  (XOPENEX  HFA) 45 MCG/ACT inhaler Inhale 2 puffs every 6 hours as needed   No current facility-administered medications for this visit. (Other)   REVIEW OF SYSTEMS:   ALLERGIES Allergies  Allergen Reactions   Crestor [Rosuvastatin]     Joint stiffness   Lipitor [Atorvastatin  Calcium ] Other (See Comments)    Joint stiffness   Zetia [Ezetimibe] Other (See Comments)    Muscle aches   PAST MEDICAL HISTORY Past Medical History:  Diagnosis Date   Allergic conjunctivitis    Chronic obstructive asthma, unspecified    History of adenomatous polyp of colon 2005   Less than 1 cm   Hyperlipidemia    IBS (irritable bowel syndrome)    Diarrhea  predominant   Insomnia, unspecified    Past Surgical History:  Procedure Laterality Date   COLONOSCOPY     GINGIVAL GRAFT     LEFT MENISCUS TEAR -ARTHROSCOPIC REPAIR     FAMILY HISTORY Family History  Problem Relation Age of Onset   Diabetes Mother        borderline diabetes   Cancer Father        BLADDER   Asthma Brother    Colon cancer Maternal Aunt 60   Stomach cancer Neg Hx    Colon polyps Neg Hx    Esophageal cancer Neg Hx    Rectal cancer Neg Hx    SOCIAL HISTORY Social History   Tobacco Use   Smoking status: Never    Passive exposure: Never   Smokeless tobacco: Never  Vaping Use   Vaping status: Never Used  Substance Use Topics   Alcohol use: Yes    Comment: occasional   Drug use: No       OPHTHALMIC EXAM:  Not recorded    IMAGING AND PROCEDURES  Imaging and Procedures for 08/23/2024         ASSESSMENT/PLAN: No diagnosis found.  1. 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, OS - pt reports 1 mo history of blurred vision and distortion, affecting his golfing - BCVA OS 20/40 - stable - ** hx of interval increase of IRF/SRF at 5 weeks 01.21.26** - s/p IVA OS #1 (01.02.25), #2 (01.30.25), #3 (02.27.25)  --  IVA resistance ========= - s/p IVE OS #1 (03.27.25), #2 (04.24.25), #3 (05.22.25)  -- IVE resistance ========= - s/p IVV OS #1 (06.23.25), #2 (07.24.25), #3 (08.27.25), #4 (09.24.25), #5 (10.22.25), #6 (11.19.25), #7 (12.17.25), #8 (01.21.26) - OCT OS shows: CNV/low PED w/ trace IRF and SRF --slightly increased at 5 weeks   - recommend IVV OS #9 today 02.18.26 w/ f/u back to 4 wks - pt wishes to be treated with IVV - RBA of procedure discussed, questions answered - IVV informed consent obtained and signed 06.23.25 - see procedure note - Eylea  and Vabysmo  approved for one eye only -- insurance paying 100%  - f/u in 4 wks -- DFE/OCT, possible injection   2. Age related macular degeneration, non-exudative, OD - The incidence, anatomy, and  pathology of dry AMD, risk of progression, and the AREDS and AREDS 2 studies including smoking risks discussed with patient.  - recommend Amsler grid monitoring  3.  Mixed Cataract OU - The symptoms of cataract, surgical options, and treatments and risks were discussed with patient. - discussed diagnosis and progression - under the expert care of [facility] - monitor  Ophthalmic Meds Ordered this visit:  No orders of the defined types were placed in this encounter.    No follow-ups on file.  There are no Patient Instructions on file for this visit.  Explained the diagnoses, plan, and follow up with the patient and they expressed understanding.  Patient expressed understanding of the importance of proper follow up care.   This document serves as a record of services personally performed by Redell JUDITHANN Hans, MD, PhD. It was created on their behalf by Almetta Pesa, an ophthalmic technician. The creation of this record is the provider's dictation and/or activities during the visit.    Electronically signed by: Almetta Pesa, OA, 08/10/24  1:39 PM    Redell JUDITHANN Hans, M.D., Ph.D. Diseases & Surgery of the Retina and Vitreous [facility]   Abbreviations: M myopia (nearsighted); A astigmatism; H hyperopia (farsighted); P presbyopia; Mrx spectacle prescription;  CTL contact lenses; OD right eye; OS left eye; OU both eyes  XT exotropia; ET esotropia; PEK punctate epithelial keratitis; PEE punctate epithelial erosions; DES dry eye syndrome; MGD meibomian gland dysfunction; ATs artificial tears; PFAT's preservative free artificial tears; NSC nuclear sclerotic cataract; PSC posterior subcapsular cataract; ERM epi-retinal membrane; PVD posterior vitreous detachment; RD retinal detachment; DM diabetes mellitus; DR diabetic retinopathy; NPDR non-proliferative diabetic retinopathy; PDR proliferative diabetic retinopathy; CSME clinically significant macular edema; DME  diabetic macular edema; dbh dot blot hemorrhages; CWS cotton wool spot; POAG primary open angle glaucoma; C/D cup-to-disc ratio; HVF humphrey visual field; GVF goldmann visual field; OCT optical coherence tomography; IOP intraocular pressure; BRVO Branch retinal vein occlusion; CRVO central retinal vein occlusion; CRAO central retinal artery occlusion; BRAO branch retinal artery occlusion; RT retinal tear; SB scleral buckle; PPV pars plana vitrectomy; VH Vitreous hemorrhage; PRP panretinal laser photocoagulation; IVK intravitreal kenalog; VMT vitreomacular traction; MH Macular hole;  NVD neovascularization of the disc; NVE neovascularization elsewhere; AREDS age related eye disease study; ARMD age related macular degeneration; POAG primary open angle glaucoma; EBMD epithelial/anterior basement membrane dystrophy; ACIOL anterior chamber intraocular lens; IOL intraocular lens; PCIOL posterior chamber intraocular lens; Phaco/IOL phacoemulsification with intraocular lens placement; PRK photorefractive keratectomy; LASIK laser assisted in situ keratomileusis; HTN hypertension; DM diabetes mellitus; COPD chronic obstructive pulmonary disease  "

## 2024-08-23 ENCOUNTER — Encounter (INDEPENDENT_AMBULATORY_CARE_PROVIDER_SITE_OTHER): Admitting: Ophthalmology

## 2024-08-23 DIAGNOSIS — H353221 Exudative age-related macular degeneration, left eye, with active choroidal neovascularization: Secondary | ICD-10-CM

## 2024-08-23 DIAGNOSIS — H353111 Nonexudative age-related macular degeneration, right eye, early dry stage: Secondary | ICD-10-CM

## 2024-08-23 DIAGNOSIS — H25013 Cortical age-related cataract, bilateral: Secondary | ICD-10-CM
